# Patient Record
Sex: Female | Born: 1950 | Race: White | Hispanic: No | Marital: Single | Smoking: Former smoker
Health system: Southern US, Community
[De-identification: ages and names within clinical notes are randomized; demographics above are authoritative.]

## PROBLEM LIST (undated history)

## (undated) DIAGNOSIS — I4719 Other supraventricular tachycardia: Secondary | ICD-10-CM

## (undated) DIAGNOSIS — I471 Supraventricular tachycardia, unspecified: Secondary | ICD-10-CM

## (undated) DIAGNOSIS — B009 Herpesviral infection, unspecified: Secondary | ICD-10-CM

## (undated) DIAGNOSIS — M8580 Other specified disorders of bone density and structure, unspecified site: Secondary | ICD-10-CM

## (undated) DIAGNOSIS — Z923 Personal history of irradiation: Secondary | ICD-10-CM

## (undated) HISTORY — DX: Supraventricular tachycardia: I47.1

## (undated) HISTORY — DX: Other specified disorders of bone density and structure, unspecified site: M85.80

## (undated) HISTORY — PX: OTHER SURGICAL HISTORY: SHX169

## (undated) HISTORY — DX: Other supraventricular tachycardia: I47.19

## (undated) HISTORY — DX: Supraventricular tachycardia, unspecified: I47.10

## (undated) HISTORY — PX: ABDOMINAL HYSTERECTOMY: SHX81

## (undated) HISTORY — DX: Herpesviral infection, unspecified: B00.9

---

## 2005-11-07 ENCOUNTER — Ambulatory Visit: Payer: Self-pay | Admitting: Gastroenterology

## 2008-10-08 ENCOUNTER — Other Ambulatory Visit: Admission: RE | Admit: 2008-10-08 | Discharge: 2008-10-08 | Payer: Self-pay | Admitting: Family Medicine

## 2008-10-23 ENCOUNTER — Encounter: Admission: RE | Admit: 2008-10-23 | Discharge: 2008-10-23 | Payer: Self-pay | Admitting: Family Medicine

## 2008-11-03 ENCOUNTER — Encounter: Admission: RE | Admit: 2008-11-03 | Discharge: 2008-11-03 | Payer: Self-pay | Admitting: Family Medicine

## 2009-11-19 ENCOUNTER — Encounter: Admission: RE | Admit: 2009-11-19 | Discharge: 2009-11-19 | Payer: Self-pay | Admitting: Family Medicine

## 2010-05-01 HISTORY — PX: COLONOSCOPY: SHX174

## 2010-11-10 ENCOUNTER — Other Ambulatory Visit: Payer: Self-pay | Admitting: Physician Assistant

## 2010-11-10 DIAGNOSIS — Z1231 Encounter for screening mammogram for malignant neoplasm of breast: Secondary | ICD-10-CM

## 2010-11-24 ENCOUNTER — Ambulatory Visit
Admission: RE | Admit: 2010-11-24 | Discharge: 2010-11-24 | Disposition: A | Discharge status: Home / Self Care | Payer: PRIVATE HEALTH INSURANCE | Source: Ambulatory Visit | Attending: Physician Assistant | Admitting: Physician Assistant

## 2010-11-24 DIAGNOSIS — Z1231 Encounter for screening mammogram for malignant neoplasm of breast: Secondary | ICD-10-CM

## 2011-01-26 ENCOUNTER — Ambulatory Visit: Payer: Self-pay | Admitting: Gastroenterology

## 2011-04-17 ENCOUNTER — Encounter: Payer: Self-pay | Admitting: Internal Medicine

## 2011-09-18 ENCOUNTER — Telehealth: Payer: Self-pay | Admitting: Internal Medicine

## 2011-09-28 ENCOUNTER — Institutional Professional Consult (permissible substitution): Payer: PRIVATE HEALTH INSURANCE | Admitting: Internal Medicine

## 2011-12-07 ENCOUNTER — Encounter: Payer: Self-pay | Admitting: *Deleted

## 2011-12-08 ENCOUNTER — Ambulatory Visit (INDEPENDENT_AMBULATORY_CARE_PROVIDER_SITE_OTHER): Payer: PRIVATE HEALTH INSURANCE | Admitting: Internal Medicine

## 2011-12-08 ENCOUNTER — Encounter: Payer: Self-pay | Admitting: Internal Medicine

## 2011-12-08 VITALS — BP 111/75 | HR 51 | Ht 66.0 in | Wt 125.4 lb

## 2011-12-08 DIAGNOSIS — Z720 Tobacco use: Secondary | ICD-10-CM | POA: Insufficient documentation

## 2011-12-08 DIAGNOSIS — I498 Other specified cardiac arrhythmias: Secondary | ICD-10-CM

## 2011-12-08 DIAGNOSIS — I471 Supraventricular tachycardia: Secondary | ICD-10-CM

## 2011-12-08 DIAGNOSIS — F17200 Nicotine dependence, unspecified, uncomplicated: Secondary | ICD-10-CM

## 2011-12-08 NOTE — Assessment & Plan Note (Signed)
I've discussed the treatment options in detail with the patient. Medical therapy versus catheter ablation have been reviewed. The risk, goals, benefits, and expectations of catheter ablation have been discussed. The patient is interested in pursuing catheter ablation and will call us when she would like to schedule this procedure.

## 2011-12-08 NOTE — Patient Instructions (Signed)

## 2011-12-08 NOTE — Progress Notes (Signed)
HPI Carrie Serrano is referred today by Dr. Anne Fu for evaluation of SVT. She is a very pleasant middle-age woman who's health has otherwise been good. She has a history of tobacco abuse. The patient has a history of 10 years of tachycardia palpitations and documented SVT. The episodes start and stop suddenly. They lasted for up to several hours and induration. They are associated with shortness of breath and dizziness but never syncope and no chest pain. She has learned over time that she can stop the episodes with carotid sinus massage or Valsalva maneuver. She has worn a cardiac monitor which demonstrates a narrow QRS tachycardia at 175 beats per minute. Her symptoms have been recently well controlled with beta blockers. Unfortunately, she notes fatigue and weakness on her current dose of 50 mg daily. No Known Allergies   Current Outpatient Prescriptions  Medication Sig Dispense Refill  . BIOTIN FORTE PO Take 3,000 mg by mouth daily.      . fish oil-omega-3 fatty acids 1000 MG capsule Take 1 g by mouth daily.      . metoprolol succinate (TOPROL-XL) 50 MG 24 hr tablet Take 50 mg by mouth daily. 1/2 tablet  By mouth immediately following a meal.      . Multiple Vitamin (MULTIVITAMIN) tablet Take 1 tablet by mouth daily.      . valACYclovir (VALTREX) 500 MG tablet Take 500 mg by mouth 2 (two) times daily as needed.         Past Medical History  Diagnosis Date  . Osteopenia   . HSV-2 (herpes simplex virus 2) infection   . SVT (supraventricular tachycardia)   . PAT (paroxysmal atrial tachycardia)     ROS:   All systems reviewed and negative except as noted in the HPI.   No past surgical history on file.   No family history on file.   History   Social History  . Marital Status: Unknown    Spouse Name: N/A    Number of Children: N/A  . Years of Education: N/A   Occupational History  . Not on file.   Social History Main Topics  . Smoking status: Current Everyday Smoker -- 0.5  packs/day    Types: Cigarettes  . Smokeless tobacco: Never Used   Comment: 5 cigarettes a day.  . Alcohol Use: 3.5 - 4.5 oz/week    7-9 drink(s) per week  . Drug Use: No  . Sexually Active: Not on file   Other Topics Concern  . Not on file   Social History Narrative  . No narrative on file     BP 111/75  Pulse 51  Ht 5\' 6"  (1.676 m)  Wt 125 lb 6.4 oz (56.881 kg)  BMI 20.24 kg/m2  Physical Exam:  Well appearing middle-aged woman, NAD HEENT: Unremarkable Neck:  No JVD, no thyromegally Lungs:  Clear with no wheezes, rales, or rhonchi. HEART:  Regular rate rhythm, no murmurs, no rubs, no clicks Abd:  soft, positive bowel sounds, no organomegally, no rebound, no guarding Ext:  2 plus pulses, no edema, no cyanosis, no clubbing Skin:  No rashes no nodules Neuro:  CN II through XII intact, motor grossly intact  EKG Normal sinus rhythm with no ventricular preexcitation.  Assess/Plan:

## 2011-12-08 NOTE — Assessment & Plan Note (Signed)
She is still smoking a half pack of cigarettes daily. Hopefully she can discontinue this after ablation.

## 2012-01-29 ENCOUNTER — Other Ambulatory Visit: Payer: Self-pay | Admitting: Physician Assistant

## 2012-01-29 DIAGNOSIS — Z1231 Encounter for screening mammogram for malignant neoplasm of breast: Secondary | ICD-10-CM

## 2012-02-28 ENCOUNTER — Ambulatory Visit
Admission: RE | Admit: 2012-02-28 | Discharge: 2012-02-28 | Disposition: A | Discharge status: Home / Self Care | Payer: No Typology Code available for payment source | Source: Ambulatory Visit | Attending: Physician Assistant | Admitting: Physician Assistant

## 2012-02-28 DIAGNOSIS — Z1231 Encounter for screening mammogram for malignant neoplasm of breast: Secondary | ICD-10-CM

## 2012-02-29 ENCOUNTER — Telehealth: Payer: Self-pay | Admitting: Internal Medicine

## 2012-02-29 NOTE — Telephone Encounter (Signed)
Spoke with patient and she stated she had a patient with her and she could not talk right now.  Will call back later

## 2012-02-29 NOTE — Telephone Encounter (Signed)
Pt has questions regarding maybe getting an ablation done but she has some questions that she wants to go over with you please call her in afternoon that is the best time to reach her

## 2012-03-04 NOTE — Telephone Encounter (Signed)
Called patient back and she needs to know what the procedure code is and how long she will stay in the hospital. Wanted to know the average time for the procedure.  I let her know she can come in in Jan for an office visit to set up for Feb for the procedure.

## 2012-03-04 NOTE — Telephone Encounter (Signed)
I told her I would send the message to the coding people to call her in regards to codes for procedure.  All other questions answered and appointment made for Jan 2014

## 2012-03-08 NOTE — Telephone Encounter (Signed)
Spoke w/pt today and gave her CPT code for her ablation and our portion (MD) fee - approx $3,392.  She understands more fees will be involved such as anesthiology and hospital fees.  She will call her insurance to discuss her individual plan.  Gave my direct line if she has any further questions.

## 2012-03-26 ENCOUNTER — Telehealth: Payer: Self-pay | Admitting: Internal Medicine

## 2012-03-26 NOTE — Telephone Encounter (Signed)
Will need an office visit first as it as been 3 months since she has been seen

## 2012-03-26 NOTE — Telephone Encounter (Signed)
plz return call to pt at wk# to schedule an ablation per Dr. Ladona Ridgel

## 2012-05-06 ENCOUNTER — Telehealth: Payer: Self-pay | Admitting: Internal Medicine

## 2012-05-06 NOTE — Telephone Encounter (Signed)
New Problem:    Called in wanting to know the available dates that Dr. Ladona Ridgel has to perform her upcoming procedure.  Please call back and feel free to leave a messgae.

## 2012-05-13 NOTE — Telephone Encounter (Signed)
Tried to call patient  Number busy

## 2012-05-13 NOTE — Telephone Encounter (Signed)
Pt calling back, didn't get call back from message from last week

## 2012-05-14 ENCOUNTER — Encounter: Payer: Self-pay | Admitting: Internal Medicine

## 2012-05-14 ENCOUNTER — Other Ambulatory Visit: Payer: Self-pay | Admitting: *Deleted

## 2012-05-14 ENCOUNTER — Ambulatory Visit (INDEPENDENT_AMBULATORY_CARE_PROVIDER_SITE_OTHER): Payer: No Typology Code available for payment source | Admitting: Internal Medicine

## 2012-05-14 VITALS — BP 104/68 | HR 72 | Ht 66.0 in | Wt 129.8 lb

## 2012-05-14 DIAGNOSIS — I471 Supraventricular tachycardia: Secondary | ICD-10-CM

## 2012-05-14 DIAGNOSIS — I498 Other specified cardiac arrhythmias: Secondary | ICD-10-CM

## 2012-05-14 NOTE — Patient Instructions (Signed)
Your physician has recommended that you have an EP Study. This test is used to assess serious arrhythmias (irregular heartbeats). During an Electro-physiology Study (EPS), a thin, flexible wire is passed through a vein in your groin (upper thigh) or neck up to the heart. The wire records the heart's electrical signals. Your doctor uses the wire to electrically stimulate your heart and trigger an arrhythmic. This allows the doctor to see whether an antiarrhythmia medicine can help manage the problem or if further procedures are necessary (i.e., ablation/ICD). Radiofrequency ablation, a procedure used to fix some types of arrthythmia, may be done during an EPS. This is done in the hospital and often requires an overnight stay. Please see the instruction sheet given to your today for more information.  Your physician has recommended that you have an SVT ablation. Catheter ablation is a medical procedure used to treat some cardiac arrhythmias (irregular heartbeats). During catheter ablation, a long, thin, flexible tube is put into a blood vessel in your groin (upper thigh), or neck. This tube is called an ablation catheter. It is then guided to your heart through the blood vessel. Radio frequency waves destroy small areas of heart tissue where abnormal heartbeats may cause an arrhythmia to start. Please see the instruction sheet given to you today.

## 2012-05-14 NOTE — Progress Notes (Signed)
HPI Ms. Kaigler returns today for followup. She is a very pleasant 62 year old woman with a history of documented SVT which has been treated with moderate dose beta blocker therapy. When I saw the patient last several months ago, she was having increasingly frequent episodes of tachycardia palpitations though she had not sought medical therapy. In the interim she is been stable except for recurrent episodes of SVT. These start and stop suddenly. She also has palpitations which sound more like PACs. No syncope and no chest pain or shortness of breath. No Known Allergies   Current Outpatient Prescriptions  Medication Sig Dispense Refill  . BIOTIN FORTE PO Take 3,000 mg by mouth daily.      . fish oil-omega-3 fatty acids 1000 MG capsule Take 1 g by mouth daily.      . metoprolol succinate (TOPROL-XL) 50 MG 24 hr tablet Take 50 mg by mouth daily. 1/2 tablet  By mouth immediately following a meal.      . Multiple Vitamin (MULTIVITAMIN) tablet Take 1 tablet by mouth daily.      . valACYclovir (VALTREX) 500 MG tablet Take 500 mg by mouth 2 (two) times daily as needed.         Past Medical History  Diagnosis Date  . Osteopenia   . HSV-2 (herpes simplex virus 2) infection   . SVT (supraventricular tachycardia)   . PAT (paroxysmal atrial tachycardia)     ROS:   All systems reviewed and negative except as noted in the HPI.   No past surgical history on file.   No family history on file.   History   Social History  . Marital Status: Unknown    Spouse Name: N/A    Number of Children: N/A  . Years of Education: N/A   Occupational History  . Not on file.   Social History Main Topics  . Smoking status: Current Every Day Smoker -- 0.5 packs/day    Types: Cigarettes  . Smokeless tobacco: Never Used     Comment: 5 cigarettes a day.  . Alcohol Use: 3.5 - 4.5 oz/week    7-9 drink(s) per week  . Drug Use: No  . Sexually Active: Not on file   Other Topics Concern  . Not on file    Social History Narrative  . No narrative on file     BP 104/68  Pulse 72  Ht 5\' 6"  (1.676 m)  Wt 129 lb 12.8 oz (58.877 kg)  BMI 20.95 kg/m2  Physical Exam:  Well appearing middle-aged woman, NAD HEENT: Unremarkable Neck:  No JVD, no thyromegally Lungs:  Clear with no wheezes, rales, or rhonchi. HEART:  Regular rate rhythm, no murmurs, no rubs, no clicks Abd:  soft, positive bowel sounds, no organomegally, no rebound, no guarding Ext:  2 plus pulses, no edema, no cyanosis, no clubbing Skin:  No rashes no nodules Neuro:  CN II through XII intact, motor grossly intact  EKG Normal sinus rhythm with no ventricular preexcitation  Assess/Plan:

## 2012-05-14 NOTE — Assessment & Plan Note (Signed)
Once again I have discussed the treatment options with the patient. The risk, goals, benefits, and expectations of catheter ablation have been reviewed and the patient would like to proceed with her procedure. This will be scheduled in the next several weeks.

## 2012-05-15 ENCOUNTER — Telehealth: Payer: Self-pay | Admitting: Internal Medicine

## 2012-05-15 NOTE — Telephone Encounter (Signed)
New Problem:    Patient called returning your call about lab work due for her upcoming procedure on June 13, 2012.  Please call back.

## 2012-05-15 NOTE — Telephone Encounter (Signed)
Saw Dr Ladona Ridgel yesterday Set up for procedure

## 2012-05-16 ENCOUNTER — Encounter: Payer: Self-pay | Admitting: *Deleted

## 2012-05-17 NOTE — Telephone Encounter (Signed)
Spoke with patient yesterday and scheduled labs.

## 2012-05-29 ENCOUNTER — Encounter (HOSPITAL_COMMUNITY): Payer: Self-pay | Admitting: Pharmacy Technician

## 2012-06-04 ENCOUNTER — Other Ambulatory Visit (INDEPENDENT_AMBULATORY_CARE_PROVIDER_SITE_OTHER): Payer: No Typology Code available for payment source

## 2012-06-04 DIAGNOSIS — I498 Other specified cardiac arrhythmias: Secondary | ICD-10-CM

## 2012-06-04 DIAGNOSIS — I471 Supraventricular tachycardia: Secondary | ICD-10-CM

## 2012-06-05 ENCOUNTER — Other Ambulatory Visit: Payer: No Typology Code available for payment source

## 2012-06-05 LAB — BASIC METABOLIC PANEL
Chloride: 105 mEq/L (ref 96–112)
GFR: 64.74 mL/min (ref >60.00)
Glucose, Bld: 71 mg/dL (ref 70–99)
Potassium: 4.6 mEq/L (ref 3.5–5.1)
Sodium: 140 mEq/L (ref 135–145)

## 2012-06-05 LAB — CBC WITH DIFFERENTIAL/PLATELET
Basophils Absolute: 0.1 10*3/uL (ref 0.0–0.1)
HCT: 42.3 % (ref 36.0–46.0)
Hemoglobin: 14.3 g/dL (ref 12.0–15.0)
Lymphocytes Relative: 28.1 % (ref 12.0–46.0)
MCHC: 33.7 g/dL (ref 30.0–36.0)
MCV: 96.8 fl (ref 78.0–100.0)
Platelets: 199 10*3/uL (ref 150.0–400.0)

## 2012-06-13 ENCOUNTER — Encounter (HOSPITAL_COMMUNITY): Payer: Self-pay | Admitting: General Practice

## 2012-06-13 ENCOUNTER — Encounter (HOSPITAL_COMMUNITY)
Admission: RE | Disposition: A | Discharge status: Home / Self Care | Payer: Self-pay | Source: Ambulatory Visit | Attending: Internal Medicine

## 2012-06-13 ENCOUNTER — Ambulatory Visit (HOSPITAL_COMMUNITY)
Admission: RE | Admit: 2012-06-13 | Discharge: 2012-06-14 | Disposition: A | Discharge status: Home / Self Care | Payer: No Typology Code available for payment source | Source: Ambulatory Visit | Attending: Internal Medicine | Admitting: Internal Medicine

## 2012-06-13 DIAGNOSIS — I471 Supraventricular tachycardia: Secondary | ICD-10-CM

## 2012-06-13 DIAGNOSIS — M949 Disorder of cartilage, unspecified: Secondary | ICD-10-CM | POA: Insufficient documentation

## 2012-06-13 DIAGNOSIS — I498 Other specified cardiac arrhythmias: Secondary | ICD-10-CM | POA: Insufficient documentation

## 2012-06-13 DIAGNOSIS — B009 Herpesviral infection, unspecified: Secondary | ICD-10-CM | POA: Insufficient documentation

## 2012-06-13 DIAGNOSIS — M899 Disorder of bone, unspecified: Secondary | ICD-10-CM | POA: Insufficient documentation

## 2012-06-13 HISTORY — PX: ABLATION OF DYSRHYTHMIC FOCUS: SHX254

## 2012-06-13 HISTORY — PX: ELECTROPHYSIOLOGY STUDY: SHX5467

## 2012-06-13 HISTORY — PX: SUPRAVENTRICULAR TACHYCARDIA ABLATION: SHX5492

## 2012-06-13 SURGERY — ELECTROPHYSIOLOGY STUDY
Anesthesia: LOCAL

## 2012-06-13 MED ORDER — SODIUM CHLORIDE 0.9 % IV SOLN: 250.0000 mL | INTRAVENOUS | Status: DC | PRN

## 2012-06-13 MED ORDER — METOPROLOL SUCCINATE ER 25 MG PO TB24
25.0000 mg | ORAL_TABLET | Freq: Every day | ORAL | Status: DC
  Filled 2012-06-13 (×2): qty 1

## 2012-06-13 MED ORDER — ONDANSETRON HCL 4 MG/2ML IJ SOLN: 4.0000 mg | Freq: Four times a day (QID) | INTRAMUSCULAR | Status: DC | PRN

## 2012-06-13 MED ORDER — BUPIVACAINE HCL (PF) 0.25 % IJ SOLN
INTRAMUSCULAR | Status: AC
  Filled 2012-06-13: qty 60

## 2012-06-13 MED ORDER — SODIUM CHLORIDE 0.9 % IJ SOLN: 3.0000 mL | INTRAMUSCULAR | Status: DC | PRN

## 2012-06-13 MED ORDER — SODIUM CHLORIDE 0.9 % IJ SOLN: 3.0000 mL | Freq: Two times a day (BID) | INTRAMUSCULAR | Status: DC

## 2012-06-13 MED ORDER — PNEUMOCOCCAL VAC POLYVALENT 25 MCG/0.5ML IJ INJ
0.5000 mL | INJECTION | INTRAMUSCULAR | Status: AC
  Administered 2012-06-14: 10:00:00 0.5 mL via INTRAMUSCULAR
  Filled 2012-06-13: qty 0.5

## 2012-06-13 MED ORDER — HEPARIN (PORCINE) IN NACL 2-0.9 UNIT/ML-% IJ SOLN
INTRAMUSCULAR | Status: AC
  Filled 2012-06-13: qty 1000

## 2012-06-13 MED ORDER — MIDAZOLAM HCL 5 MG/5ML IJ SOLN
INTRAMUSCULAR | Status: AC
  Filled 2012-06-13: qty 5

## 2012-06-13 MED ORDER — METOPROLOL SUCCINATE ER 25 MG PO TB24
25.0000 mg | ORAL_TABLET | Freq: Every day | ORAL | Status: DC
  Filled 2012-06-13: qty 1

## 2012-06-13 MED ORDER — FENTANYL CITRATE 0.05 MG/ML IJ SOLN
INTRAMUSCULAR | Status: AC
  Filled 2012-06-13: qty 2

## 2012-06-13 MED ORDER — FENTANYL CITRATE 0.05 MG/ML IJ SOLN
INTRAMUSCULAR | Status: AC
Start: 2012-06-13 — End: 2012-06-13
  Filled 2012-06-13: qty 2

## 2012-06-13 MED ORDER — ACETAMINOPHEN 325 MG PO TABS: 650.0000 mg | ORAL_TABLET | ORAL | Status: DC | PRN

## 2012-06-13 MED ORDER — HYDROXYUREA 500 MG PO CAPS
ORAL_CAPSULE | ORAL | Status: AC
  Filled 2012-06-13: qty 1

## 2012-06-13 NOTE — Op Note (Signed)
Carrie Serrano, Carrie Serrano NO.:  [id number]  MEDICAL RECORD NO.:  [id number]  LOCATION:  6527                         FACILITY:  [facility]  PHYSICIAN:  Doylene Canning. 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  INTRODUCTION:  The patient is a pleasant 62 year old woman with a long history of tachy palpitations refractory to medical therapy with beta- blockers.  The patient is now referred for catheter ablation of her SVT.  OPERATIVE PROCEDURE:  After the informed consent was obtained, the patient was taken to the diagnostic EP lab in a fasting state.  After usual preparation and draping, intravenous fentanyl and midazolam was given for sedation.  A 6-French hexapolar catheter was inserted percutaneously in the right jugular vein and advanced to the coronary sinus.  A 6-French quadripolar catheter was inserted percutaneously in the right femoral vein and advanced to right ventricle.  A 6-French quadripolar catheter was inserted percutaneously in the right femoral vein and advanced to the His bundle region.  After measurement of the basic intervals, rapid ventricular pacing was carried out from the right ventricle initially demonstrating VA dissociation.  However, additional rather programmed ventricular stimulation was then carried out.  Also demonstrated initial VA dissociation.  Atrial stimulation was carried out from the atrium at base drive cycle length of 5 of rather 600 milliseconds and the S1-S2 interval stepwise decreased down to 400 milliseconds where the AV node ERP was observed.  During programmed atrial stimulation, there were multiple AH jumps and echo beats, but no SVT.  Next, rapid atrial pacing was carried out from the atrium at base drive  cycle length of 161 milliseconds and stepwise decreased down to 510 milliseconds where AV Wenckebach was observed.  During rapid atrial pacing, the PR interval was less than the RR interval and there was no inducible SVT.  At this point, isoproterenol was infused at rates of 2-4 mcg per minute.  Rapid ventricular pacing was then carried out demonstrating intact conduction through the AV node.  There was no accessory pathway conduction.  The VA Wenckebach cycle length during rapid ventricular pacing was 360 milliseconds.  Programmed ventricular stimulation was then carried out at a base drive cycle length of 096 milliseconds.  The S1-S2 interval stepwise decreased down to 380 milliseconds resulting in the initiation of SVT.  Mapping was carried out.  Mapping demonstrated midline VA activation and the VA interval that was basically 0 in SVT.  PVCs placed at the time of His bundle refractories did not pre-excite the atrium.  Finally during SVT ventricular pacing demonstrated a VAV activation sequence.  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  At this point, the 7-French quadripolar ablation catheter was maneuvered by way of the right femoral vein into the right atrium.  The coronary sinus was extremely large.  Manipulation of the catheter in Koch's triangle resulted and cannulation of the coronary sinus very readily.  The actual portion of Koch's triangle not including the  coronary sinus was actually very very small.  The tricuspid valve anulus appeared to be fairly posterior.  Mapping onto the bottom portion of Koch's triangle was result in the ablation catheter diving into the coronary sinus immediately.  A total of 26 RF energy applications, most of which were very short was then applied in and around the region of the AV node. During RF energy application, RF was discontinued because of accelerated junctional rhythm resulting in [place] block.  After approximately 2 hours  of mapping and ablation, it was deemed that the tachycardia would be much more difficult to induce, was still inducible, and it was deemed that any additional ablation would result in the creation of complete heart block.  Because of the patient's prior indication not to end up with a pacemaker possible, it was deemed most appropriate to abandon additional ablation.  It should be noted that, programmed ventricular stimulation at the conclusion of the procedure did result in SVT; however, the S1-S2 interval coupled together had to be 250 milliseconds to result in SVT. The clinical significance of this is uncertain.  At this point, the catheters were removed.  Hemostasis was assured, and the patient was returned to her room in satisfactory condition.  COMPLICATIONS:  There were no immediate procedure complications.  RESULTS:  A. Baseline ECG.  Baseline ECG demonstrates sinus rhythm with no pre-excitation. B.  Baseline intervals.  Sinus node cycle length 950 milliseconds.  QRS duration 115 milliseconds, AH interval 130 milliseconds, HV interval 40 milliseconds. C.  Rapid ventricular pacing:  Rapid ventricle patient was initially carried out demonstrating VA Wenckebach at 600 milliseconds.  Subsequent rapid ventricular pacing on isoproterenol resulted in intact VA conduction with midline decremental VA activation. D.  Programmed ventricular stimulation.  Programmed ventricular stimulation was carried out from the right ventricle with base drive cycle length of 409 milliseconds.  The S1-S2 interval stepwise decreased from 440 milliseconds.  Then, a 380 milliseconds resulted in the initiation of SVT. E.  Rapid atrial pacing.  Rapid atrial pacing was carried out from the atrium at base drive cycle length of 811 milliseconds and stepwise decreased down to 510 millisecond resulting in AV Wenckebach.  With isoproterenol initiation rapid atrial pacing was carried out down 240 milliseconds with  no SVT. F.  Programmed atrial stimulation:  Programmed atrial stimulation was carried out from the atrium at a base drive cycle length of 914 milliseconds.  The S1-S2 interval stepwise decreased down to 400 millisecond where the AV node ERP was observed.  During programmed atrial stimulation, there were multiple AH jumps and echo beats, but no inducible SVT. G.  Arrhythmias observed:  AV node reentrant tachycardia initiation was with programmed ventricular stimulation on isoproterenol.  The duration was sustained.  Termination was with pacing and catheter ablation. H.  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  This was not diagnosed definitively but was strongly suggested as the coronary sinus was very large. 1. RF energy application.  Total of 26 RF energy applications were     delivered, which were very short in duration.  It should be noted     during RF energy application, there was recurrent episodes of     accelerated junctional rhythm, but RF energy had to be discontinued     as the patient with manifest VA block.  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  The success of this procedure is uncertain at this point in time.  There was persistently inducible SVT at the end of the case, though it was much more difficult to induce.  The patient's atrial anatomy was very difficult as she had a coronary sinus ostium, which was at least 3 times the diameter of the normal situation and extraordinary small Koch's triangle with RF energy application applied in the triangle resulting in transient VA block on isoproterenol.     Doylene Canning. Ladona Ridgel, MD     GWT/MEDQ  D:  06/13/2012  T:  06/13/2012  Job:  [id number]

## 2012-06-13 NOTE — H&P (View-Only) (Signed)
HPI [name] returns today for followup. She is a very pleasant 61-year-old woman with a history of documented SVT which has been treated with moderate dose beta blocker therapy. When I saw the patient last several months ago, she was having increasingly frequent episodes of tachycardia palpitations though she had not sought medical therapy. In the interim she is been stable except for recurrent episodes of SVT. These start and stop suddenly. She also has palpitations which sound more like PACs. No syncope and no chest pain or shortness of breath. No Known Allergies   Current Outpatient Prescriptions  Medication Sig Dispense Refill  . BIOTIN FORTE PO Take 3,000 mg by mouth daily.      . fish oil-omega-3 fatty acids 1000 MG capsule Take 1 g by mouth daily.      . metoprolol succinate (TOPROL-XL) 50 MG 24 hr tablet Take 50 mg by mouth daily. 1/2 tablet  By mouth immediately following a meal.      . Multiple Vitamin (MULTIVITAMIN) tablet Take 1 tablet by mouth daily.      . valACYclovir (VALTREX) 500 MG tablet Take 500 mg by mouth 2 (two) times daily as needed.         Past Medical History  Diagnosis Date  . Osteopenia   . HSV-2 (herpes simplex virus 2) infection   . SVT (supraventricular tachycardia)   . PAT (paroxysmal atrial tachycardia)     ROS:   All systems reviewed and negative except as noted in the HPI.   No past surgical history on file.   No family history on file.   History   Social History  . Marital Status: Unknown    Spouse Name: N/A    Number of Children: N/A  . Years of Education: N/A   Occupational History  . Not on file.   Social History Main Topics  . Smoking status: Current Every Day Smoker -- 0.5 packs/day    Types: Cigarettes  . Smokeless tobacco: Never Used     Comment: 5 cigarettes a day.  . Alcohol Use: 3.5 - 4.5 oz/week    7-9 drink(s) per week  . Drug Use: No  . Sexually Active: Not on file   Other Topics Concern  . Not on file    Social History Narrative  . No narrative on file     BP 104/68  Pulse 72  Ht 5' 6" (1.676 m)  Wt 129 lb 12.8 oz (58.877 kg)  BMI 20.95 kg/m2  Physical Exam:  Well appearing middle-aged woman, NAD HEENT: Unremarkable Neck:  No JVD, no thyromegally Lungs:  Clear with no wheezes, rales, or rhonchi. HEART:  Regular rate rhythm, no murmurs, no rubs, no clicks Abd:  soft, positive bowel sounds, no organomegally, no rebound, no guarding Ext:  2 plus pulses, no edema, no cyanosis, no clubbing Skin:  No rashes no nodules Neuro:  CN II through XII intact, motor grossly intact  EKG Normal sinus rhythm with no ventricular preexcitation  Assess/Plan:  

## 2012-06-13 NOTE — Interval H&P Note (Signed)
History and Physical Interval Note:  06/13/2012 11:21 AM  Carrie Serrano  has presented today for surgery, with the diagnosis of SVT  The various methods of treatment have been discussed with the patient and family. After consideration of risks, benefits and other options for treatment, the patient has consented to  Procedure(s): ELECTROPHYSIOLOGY STUDY (N/A) SUPRAVENTRICULAR TACHYCARDIA ABLATION (N/A) as a surgical intervention .  The patient's history has been reviewed, patient examined, no change in status, stable for surgery.  I have reviewed the patient's chart and labs.  Questions were answered to the patient's satisfaction.     [name].D.

## 2012-06-13 NOTE — Op Note (Signed)
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 [id number].

## 2012-06-13 NOTE — Progress Notes (Signed)
B Blocker held this PM per parameter of HR less than 60.  Heart rate high 40's to mid 50's.

## 2012-06-14 DIAGNOSIS — I471 Supraventricular tachycardia, unspecified: Secondary | ICD-10-CM

## 2012-06-14 NOTE — Discharge Summary (Signed)
    ELECTROPHYSIOLOGY PROCEDURE DISCHARGE SUMMARY    Patient ID: Carrie Serrano,  MRN: [id number], DOB/AGE: 1950/12/19 62 y.o.  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  Osteopenia 2.  HSV-2 infection  Procedures This Admission: 1.  Electrophysiology study and radiofrequency catheter ablation of AVNRT on 06-13-2012 by Carrie Carrie Serrano.  This study demonstrated inducible AV node reentrant tachycardia. The success of this procedure is uncertain at this point in time. There was persistently inducible SVT at the end of the case, though it was much more difficult to induce. The patient's atrial anatomy was very difficult as she had a coronary sinus ostium, which was at least 3 times the diameter of the normal situation and extraordinary small Koch's triangle with RF energy application applied in the triangle resulting in  transient VA block on isoproterenol.  There were no early apparent complications.   Brief HPI: Carrie Serrano is a 62 year old female with a long standing history of tachy palpitations that are refractory to beta blocker therapy.  Risks, benefits, and alternatives to ablation were discussed with the patient who wished to proceed.   Hospital Course:  The patient was admitted and underwent ablation of AVNRT on 06-13-2012 with details as outlined above.  She was monitored on telemetry overnight which demonstrated sinus brady, sinus rhythm. Her groin and neck incisions were without complication.  She was evaluated by Carrie Serrano who considered her stable for discharge to home.   Discharge Vitals: Blood pressure 100/68, pulse 46, temperature 98.4 F (36.9 C), temperature source Oral, resp. rate 17, Serrano 5\' 6"  (1.676 m), weight 135 lb 14.4 oz (61.644 kg), SpO2 97.00%.    Labs:   Lab Results    Component Value Date   WBC 7.2 06/04/2012   HGB 14.3 06/04/2012   HCT 42.3 06/04/2012   MCV 96.8 06/04/2012   PLT 199.0 06/04/2012     Discharge Medications:    Medication List    ASK your doctor about these medications       BIOTIN FORTE PO  Take 3,000 mg by mouth daily.     fish oil-omega-3 fatty acids 1000 MG capsule  Take 1 g by mouth daily.     metoprolol succinate 50 MG 24 hr tablet  Commonly known as:  TOPROL-XL  Take 25 mg by mouth at bedtime.     multivitamin tablet  Take 1 tablet by mouth daily.     valACYclovir 500 MG tablet  Commonly known as:  VALTREX  Take 500 mg by mouth 2 (two) times daily as needed. For outbreak        Disposition:   Future Appointments Provider Department Dept Phone   09/12/2012 9:15 AM Carrie Maw, MD [facility] Main Office [place]) [phone]       Duration of Discharge Encounter: Greater than 30 minutes including physician time.    Carrie Range, MD

## 2012-06-14 NOTE — Progress Notes (Signed)
   ELECTROPHYSIOLOGY ROUNDING NOTE    Patient Name: Carrie Serrano Date of Encounter: 06-14-2012    SUBJECTIVE:Patient feels well.  No chest pain or shortness of breath.  S/p RFCA of AVNRT 06-13-2012.    TELEMETRY: Reviewed telemetry pt in sinus rhythm/sinus bradycardia Physical Exam: Filed Vitals:   06/14/12 0015 06/14/12 0500 06/14/12 0740 06/14/12 0745  BP: 132/67 115/68  100/68  Pulse: 52 46    Temp: 98.5 F (36.9 C) 98.5 F (36.9 C) 98.4 F (36.9 C)   TempSrc: Oral Oral Oral   Resp: 18 17    Height:      Weight: 135 lb 14.4 oz (61.644 kg)     SpO2: 97% 97%      GEN- The patient is well appearing, alert and oriented x 3 today.   Head- normocephalic, atraumatic Eyes-  Sclera clear, conjunctiva pink Ears- hearing intact Oropharynx- clear Neck- supple, no JVP Lymph- no cervical lymphadenopathy Lungs- Clear to ausculation bilaterally, normal work of breathing Heart- Regular rate and rhythm, no murmurs, rubs or gallops, PMI not laterally displaced GI- soft, NT, ND, + BS Extremities- no clubbing, cyanosis, or edema Neuro- strength and sensation are intact  Assessment and Plan:  1. SVT S/p ablation Resume metoprolol  DC to home  Wound care, restrictions reviewed with patient.  Routine follow up scheduled.

## 2012-07-30 ENCOUNTER — Encounter: Payer: Self-pay | Admitting: Internal Medicine

## 2012-07-30 ENCOUNTER — Ambulatory Visit (INDEPENDENT_AMBULATORY_CARE_PROVIDER_SITE_OTHER): Payer: No Typology Code available for payment source | Admitting: Internal Medicine

## 2012-07-30 ENCOUNTER — Encounter: Payer: No Typology Code available for payment source | Admitting: Internal Medicine

## 2012-07-30 VITALS — BP 116/69 | HR 52 | Ht 66.0 in | Wt 128.0 lb

## 2012-07-30 DIAGNOSIS — I471 Supraventricular tachycardia: Secondary | ICD-10-CM

## 2012-07-30 DIAGNOSIS — I498 Other specified cardiac arrhythmias: Secondary | ICD-10-CM

## 2012-07-30 NOTE — Assessment & Plan Note (Signed)
The patient is doing well status post catheter ablation with no recurrent symptomatic SVT. I've asked her to wean herself off of the Toprol by taking half of a 25 mg tablet daily for a month and then stop it altogether. She will call us back if she has recurrent symptomatic SVT.

## 2012-07-30 NOTE — Progress Notes (Signed)
HPI Carrie Serrano returns today for followup. She is a very pleasant 62 year old woman with a history of SVT, who underwent catheter ablation several weeks ago. She had very difficult anatomy. She had extraordinarily large coronary sinus ostium and extraordinarily small cooks triangle, making catheter ablation of her slow pathway do to AV node reentrant tachycardia much more difficult. Her procedure was notable in that she still had residual inducible SVT though not nearly as easily inducible as it had been prior to her ablation. Since her procedure, she has done well. She has had no symptomatic palpitations and no documented SVT. She remains on 25 mg of Toprol daily. No Known Allergies   Current Outpatient Prescriptions  Medication Sig Dispense Refill  . metoprolol succinate (TOPROL-XL) 50 MG 24 hr tablet Take 25 mg by mouth at bedtime.       . Multiple Vitamin (MULTIVITAMIN) tablet Take 1 tablet by mouth daily.      . valACYclovir (VALTREX) 500 MG tablet Take 500 mg by mouth 2 (two) times daily as needed. For outbreak       No current facility-administered medications for this visit.     Past Medical History  Diagnosis Date  . Osteopenia   . HSV-2 (herpes simplex virus 2) infection   . SVT (supraventricular tachycardia)   . PAT (paroxysmal atrial tachycardia)     ROS:   All systems reviewed and negative except as noted in the HPI.   Past Surgical History  Procedure Laterality Date  . Ablation of dysrhythmic focus  06/13/2012    SVT    Dr  Ladona Ridgel  . Abdominal hysterectomy    . Colonoscopy  2012     No family history on file.   History   Social History  . Marital Status: Single    Spouse Name: N/A    Number of Children: N/A  . Years of Education: N/A   Occupational History  . Not on file.   Social History Main Topics  . Smoking status: Current Every Day Smoker -- 0.50 packs/day for 25 years    Types: Cigarettes  . Smokeless tobacco: Never Used     Comment: 5  cigarettes a day.  . Alcohol Use: 3.5 - 4.5 oz/week    7-9 drink(s) per week     Comment: one beer a day  . Drug Use: No  . Sexually Active: Not on file   Other Topics Concern  . Not on file   Social History Narrative  . No narrative on file     BP 116/69  Pulse 52  Ht 5\' 6"  (1.676 m)  Wt 128 lb (58.06 kg)  BMI 20.67 kg/m2  Physical Exam:  Well appearing middle-age woman,NAD HEENT: Unremarkable Neck:  No JVD, no thyromegally Lungs:  Clear with no wheezes, rales, or rhonchi. HEART:  Regular rate rhythm, no murmurs, no rubs, no clicks Abd:  soft, positive bowel sounds, no organomegally, no rebound, no guarding Ext:  2 plus pulses, no edema, no cyanosis, no clubbing Skin:  No rashes no nodules Neuro:  CN II through XII intact, motor grossly intact  EKG Sinus rhythm with incomplete right bundle branch block and left anterior fascicular block.in  Assess/Plan:

## 2012-07-30 NOTE — Patient Instructions (Signed)
Your physician recommends that you schedule a follow-up appointment as needed  

## 2012-09-12 ENCOUNTER — Encounter: Payer: No Typology Code available for payment source | Admitting: Internal Medicine

## 2013-02-12 ENCOUNTER — Other Ambulatory Visit: Payer: Self-pay

## 2013-02-12 DIAGNOSIS — Z1231 Encounter for screening mammogram for malignant neoplasm of breast: Secondary | ICD-10-CM

## 2013-03-14 ENCOUNTER — Ambulatory Visit
Admission: RE | Admit: 2013-03-14 | Discharge: 2013-03-14 | Disposition: A | Discharge status: Home / Self Care | Payer: 59 | Source: Ambulatory Visit

## 2013-03-14 DIAGNOSIS — Z1231 Encounter for screening mammogram for malignant neoplasm of breast: Secondary | ICD-10-CM

## 2013-07-12 ENCOUNTER — Encounter: Payer: Self-pay | Admitting: *Deleted

## 2013-07-24 ENCOUNTER — Ambulatory Visit: Payer: No Typology Code available for payment source | Admitting: Cardiology

## 2013-08-15 ENCOUNTER — Ambulatory Visit: Payer: 59 | Admitting: Cardiology

## 2013-09-05 ENCOUNTER — Ambulatory Visit (INDEPENDENT_AMBULATORY_CARE_PROVIDER_SITE_OTHER): Payer: 59 | Admitting: Cardiology

## 2013-09-05 ENCOUNTER — Encounter (INDEPENDENT_AMBULATORY_CARE_PROVIDER_SITE_OTHER): Payer: Self-pay

## 2013-09-05 ENCOUNTER — Encounter: Payer: Self-pay | Admitting: Cardiology

## 2013-09-05 VITALS — BP 99/66 | HR 56 | Ht 66.0 in | Wt 126.0 lb

## 2013-09-05 DIAGNOSIS — Z720 Tobacco use: Secondary | ICD-10-CM

## 2013-09-05 DIAGNOSIS — I4460 Unspecified fascicular block: Secondary | ICD-10-CM

## 2013-09-05 DIAGNOSIS — I444 Left anterior fascicular block: Secondary | ICD-10-CM

## 2013-09-05 DIAGNOSIS — I498 Other specified cardiac arrhythmias: Secondary | ICD-10-CM

## 2013-09-05 DIAGNOSIS — I4510 Unspecified right bundle-branch block: Secondary | ICD-10-CM | POA: Insufficient documentation

## 2013-09-05 DIAGNOSIS — I471 Supraventricular tachycardia: Secondary | ICD-10-CM

## 2013-09-05 DIAGNOSIS — F17200 Nicotine dependence, unspecified, uncomplicated: Secondary | ICD-10-CM

## 2013-09-05 NOTE — Patient Instructions (Signed)
Your physician recommends that you continue on your current medications as directed. Please refer to the Current Medication list given to you today.  Your physician wants you to follow-up in: 1 year with Dr. Skains. You will receive a reminder letter in the mail two months in advance. If you don't receive a letter, please call our office to schedule the follow-up appointment.  

## 2013-09-05 NOTE — Progress Notes (Signed)
[facility]. [address], [place]  [address] Phone: [phone] Fax:  [phone]  Date:  09/05/2013   ID:  Carrie Serrano, DOB 19-Sep-1950, MRN [id number]  PCP:  REDMON,NOELLE, PA-C   History of Present Illness: Carrie Serrano is a 63 y.o. female with history of supraventricular tachycardia/paroxysmal atrial tachycardia status post ablation in 2014 by Dr. Lovena Le here for followup. On 04/17/11 her EKG strip looked AVNRT and a heart rate of 160-170. On another visit she brought me another strip of her going into her arrhythmia. I do not see the onset of the arrhythmia but when compared to prior strip, her heart rate is actually in the 140s. I do not see a clear P-wave preceding the QRS complexes. She does terminate with a PVC. The remaining beats are in sinus rhythm. EPS, questionable success in lab but clinically she has improved dramatically.     Wt Readings from Last 3 Encounters:  09/05/13 126 lb (57.153 kg)  07/30/12 128 lb (58.06 kg)  06/14/12 135 lb 14.4 oz (61.644 kg)     Past Medical History  Diagnosis Date  . Osteopenia   . HSV-2 (herpes simplex virus 2) infection   . SVT (supraventricular tachycardia)   . PAT (paroxysmal atrial tachycardia)     Past Surgical History  Procedure Laterality Date  . Ablation of dysrhythmic focus  06/13/2012    SVT    Dr  Lovena Le  . Abdominal hysterectomy    . Colonoscopy  2012    Current Outpatient Prescriptions  Medication Sig Dispense Refill  . metoprolol succinate (TOPROL-XL) 25 MG 24 hr tablet Take 12.5 mg by mouth daily.      . Multiple Vitamin (MULTIVITAMIN) tablet Take 1 tablet by mouth daily.      . valACYclovir (VALTREX) 500 MG tablet Take 500 mg by mouth 2 (two) times daily as needed. For outbreak       No current facility-administered medications for this visit.    Allergies:   Not on File  Social History:  The patient  reports that she has been smoking Cigarettes.  She has a 12.5 pack-year smoking  history. She has never used smokeless tobacco. She reports that she drinks about 3.5 - 4.5 ounces of alcohol per week. She reports that she does not use illicit drugs.   Family History  Problem Relation Age of Onset  . Stroke Mother   . Diabetes Mother   . Heart attack Father   . Hypertension Father   Father - CHF  ROS:  Please see the history of present illness.   Denies any syncope, palpitations, shortness of breath.   All other systems reviewed and negative.   PHYSICAL EXAM: VS:  BP 99/66  Pulse 56  Ht 5\' 6"  (1.676 m)  Wt 126 lb (57.153 kg)  BMI 20.35 kg/m2 Well nourished, well developed, in no acute distress HEENT: normal, [place]/AT, EOMI Neck: no JVD, normal carotid upstroke, no bruit Cardiac:  normal S1, S2; RRR; no murmur Lungs:  clear to auscultation bilaterally, no wheezing, rhonchi or rales Abd: soft, nontender, no hepatomegaly, no bruits Ext: no edema, 2+ distal pulses Skin: warm and dry GU: deferred Neuro: no focal abnormalities noted, AAO x 3  EKG:  09/05/13-sinus bradycardia rate 57 with left anterior fascicular block, right bundle branch block, bifascicular block.     ASSESSMENT AND PLAN:  1. 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 2. SVT-prior EP ablation-12.5 Toprol  at night. She is doing very well on this. We will discontinue. She is a bit worried about stopping the medication. I don't see any particular harm in continuing. 3. Tobacco cessation-discussed.  Signed, Candee Furbish, MD [facility]  09/05/2013 3:13 PM

## 2013-12-01 ENCOUNTER — Other Ambulatory Visit: Payer: Self-pay | Admitting: Cardiology

## 2014-02-23 ENCOUNTER — Other Ambulatory Visit: Payer: Self-pay

## 2014-02-23 DIAGNOSIS — Z1231 Encounter for screening mammogram for malignant neoplasm of breast: Secondary | ICD-10-CM

## 2014-03-17 ENCOUNTER — Ambulatory Visit
Admission: RE | Admit: 2014-03-17 | Discharge: 2014-03-17 | Disposition: A | Discharge status: Home / Self Care | Payer: 59 | Source: Ambulatory Visit

## 2014-03-17 DIAGNOSIS — Z1231 Encounter for screening mammogram for malignant neoplasm of breast: Secondary | ICD-10-CM

## 2014-04-09 ENCOUNTER — Encounter (HOSPITAL_COMMUNITY): Payer: Self-pay | Admitting: Internal Medicine

## 2014-05-28 ENCOUNTER — Other Ambulatory Visit: Payer: Self-pay | Admitting: Physician Assistant

## 2014-05-28 ENCOUNTER — Ambulatory Visit
Admission: RE | Admit: 2014-05-28 | Discharge: 2014-05-28 | Disposition: A | Discharge status: Home / Self Care | Payer: Self-pay | Source: Ambulatory Visit | Attending: Physician Assistant | Admitting: Physician Assistant

## 2014-05-28 DIAGNOSIS — R05 Cough: Secondary | ICD-10-CM

## 2014-05-28 DIAGNOSIS — R059 Cough, unspecified: Secondary | ICD-10-CM

## 2014-05-28 IMAGING — CR DG CHEST 2V
2 series · 2 of 2 positions shown · non-contrast
Comparison: None.

CLINICAL DATA: 63-year-old female with 3 week history of cough and
4 day history of chest pain

EXAM:
CHEST  2 VIEW

[w chest pa]
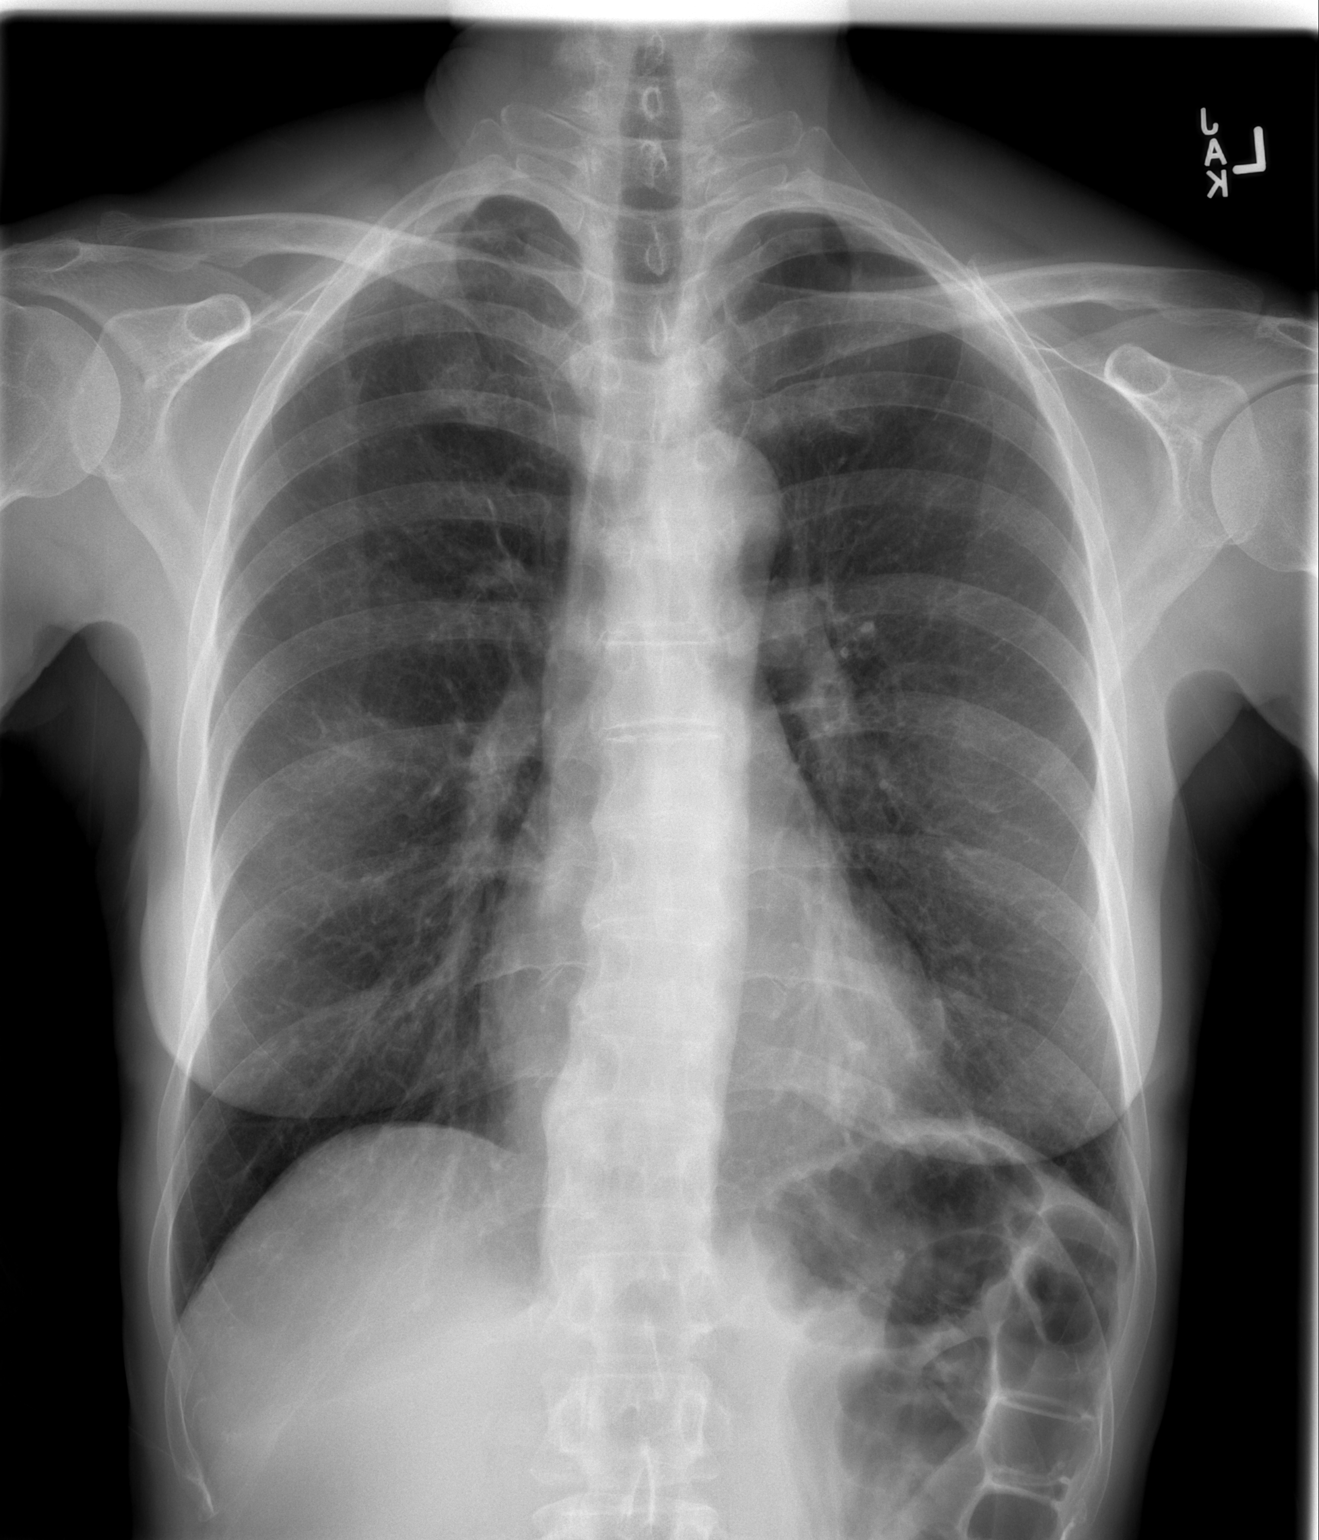

[w chest lat]
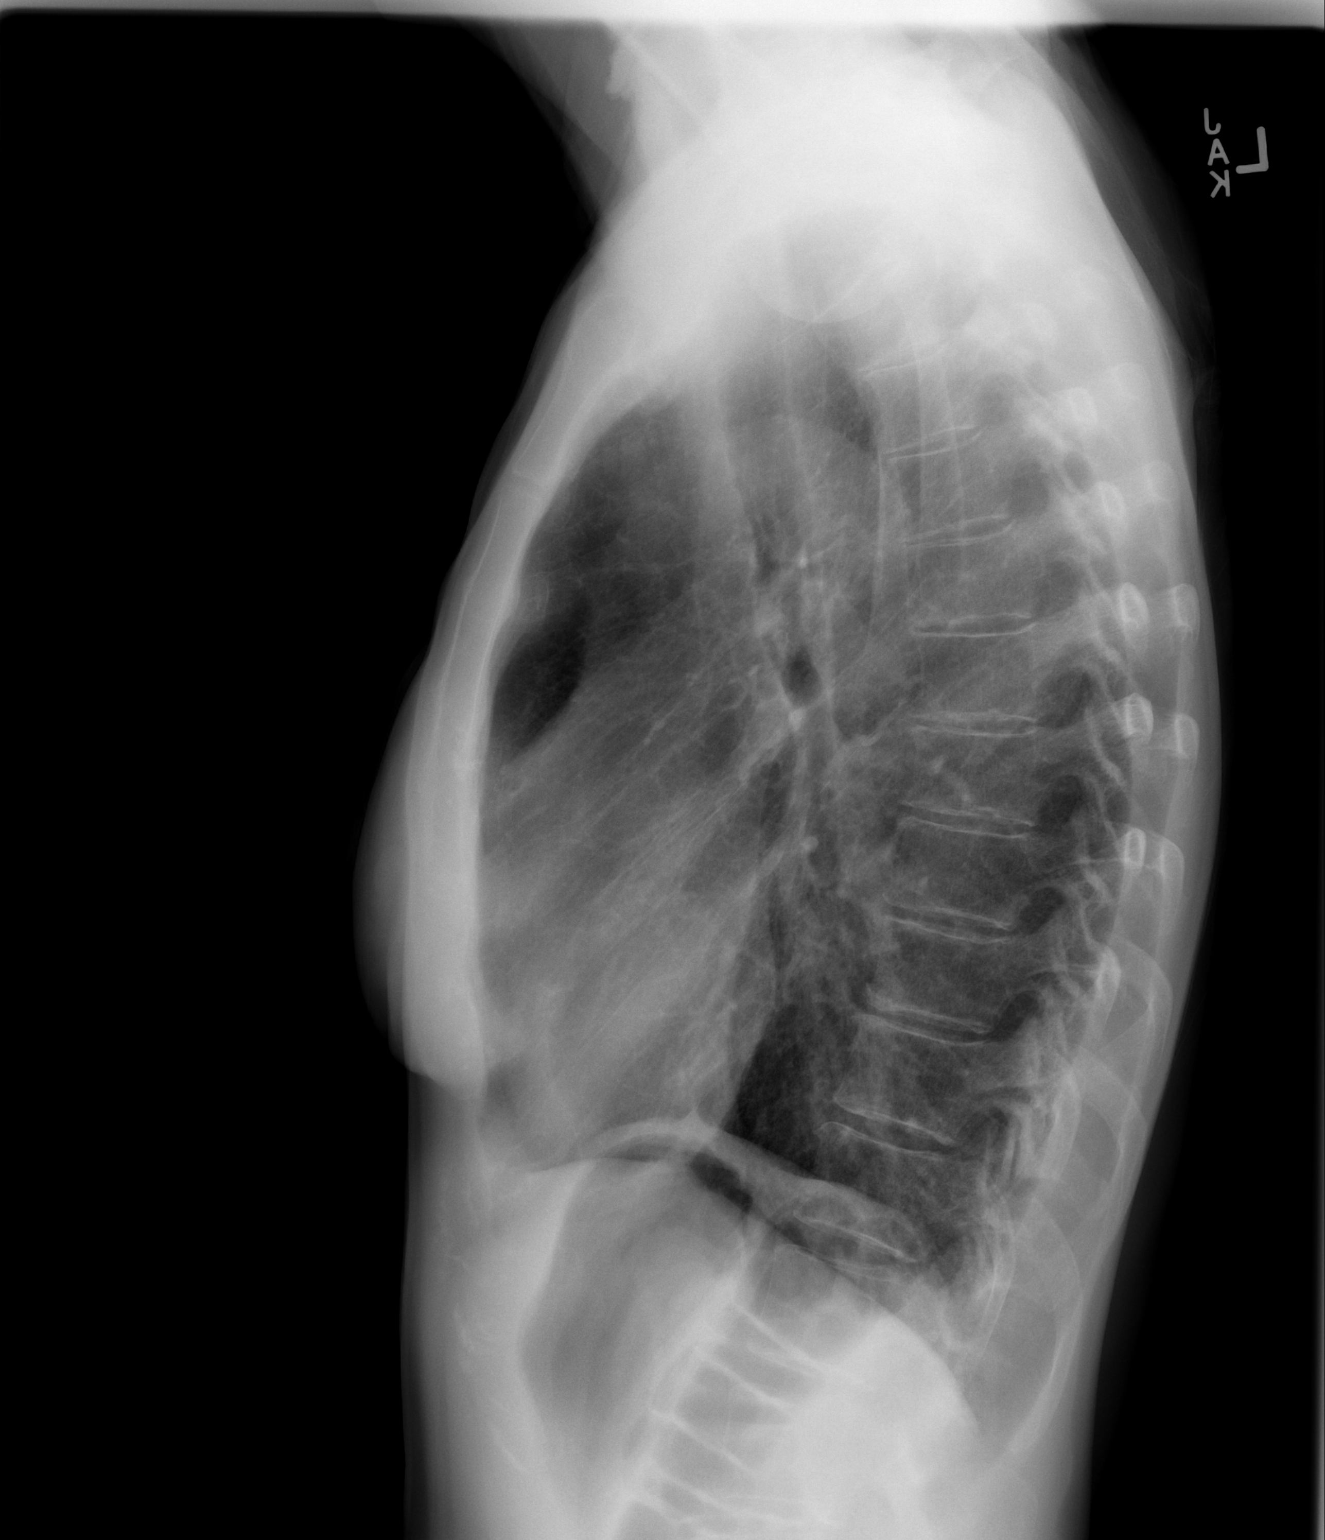

[2 of 2 positions shown; findings below may reference images not displayed]

FINDINGS: The cardiac and mediastinal contours are within normal limits. The
lungs are hyperexpanded. There are diffuse mild central bronchitic
changes and slight coarsening of the interstitial prominence. No
evidence of pleural effusion, pneumothorax or pulmonary edema. No
focal airspace consolidation. No acute osseous abnormality.
IMPRESSION: 1. No acute cardiopulmonary process.
2. Pulmonary hyperexpansion with central bronchitic change in mild
coarsening of the interstitial markings. In aggregate, these
findings suggest underlying COPD.

## 2014-06-10 ENCOUNTER — Other Ambulatory Visit: Payer: Self-pay | Admitting: Cardiology

## 2014-09-18 ENCOUNTER — Ambulatory Visit (INDEPENDENT_AMBULATORY_CARE_PROVIDER_SITE_OTHER): Payer: 59 | Admitting: Cardiology

## 2014-09-18 ENCOUNTER — Encounter: Payer: Self-pay | Admitting: Cardiology

## 2014-09-18 VITALS — BP 110/62 | HR 50 | Ht 66.0 in | Wt 119.1 lb

## 2014-09-18 DIAGNOSIS — Z720 Tobacco use: Secondary | ICD-10-CM

## 2014-09-18 DIAGNOSIS — Z87891 Personal history of nicotine dependence: Secondary | ICD-10-CM

## 2014-09-18 DIAGNOSIS — I4510 Unspecified right bundle-branch block: Secondary | ICD-10-CM

## 2014-09-18 DIAGNOSIS — I471 Supraventricular tachycardia: Secondary | ICD-10-CM

## 2014-09-18 DIAGNOSIS — I444 Left anterior fascicular block: Secondary | ICD-10-CM | POA: Diagnosis not present

## 2014-09-18 NOTE — Progress Notes (Signed)
[facility]. [address], [place]  [address] Phone: [phone] Fax:  [phone]  Date:  09/18/2014   ID:  Carrie Serrano, DOB 01-Apr-1951, MRN [id number]  PCP:  REDMON,NOELLE, PA-C   History of Present Illness: Carrie Serrano is a 64 y.o. female with history of supraventricular tachycardia/paroxysmal atrial tachycardia status post ablation in 2014 by Dr. Lovena Le here for followup. On 04/17/11 her EKG strip looked AVNRT and a heart rate of 160-170. On another visit she brought me another strip of her going into her arrhythmia. I do not see the onset of the arrhythmia but when compared to prior strip, her heart rate is actually in the 140s. I do not see a clear P-wave preceding the QRS complexes. She does terminate with a PVC. The remaining beats are in sinus rhythm. EPS, questionable success in lab but clinically she has improved dramatically.   She has been doing well. 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 She is working hard with Dr. Buelah Manis.    Wt Readings from Last 3 Encounters:  09/18/14 119 lb 1.9 oz (54.032 kg)  09/05/13 126 lb (57.153 kg)  07/30/12 128 lb (58.06 kg)     Past Medical History  Diagnosis Date  . Osteopenia   . HSV-2 (herpes simplex virus 2) infection   . SVT (supraventricular tachycardia)   . PAT (paroxysmal atrial tachycardia)     Past Surgical History  Procedure Laterality Date  . Ablation of dysrhythmic focus  06/13/2012    SVT    Dr  Lovena Le  . Abdominal hysterectomy    . Colonoscopy  2012  . Electrophysiology study N/A 06/13/2012    Procedure: ELECTROPHYSIOLOGY STUDY;  Surgeon: Evans Lance, MD;  Location: [facility] CATH LAB;  Service: Cardiovascular;  Laterality: N/A;  . Supraventricular tachycardia ablation N/A 06/13/2012    Procedure: SUPRAVENTRICULAR TACHYCARDIA ABLATION;  Surgeon: Evans Lance, MD;  Location[facility] C[facility] LAB;  Service: Cardiovascular;  Laterality: N/A;    Current Outpatient Prescriptions    Medication Sig Dispense Refill  . metoprolol succinate (TOPROL-XL) 25 MG 24 hr tablet Take 12.5 mg by mouth daily.    . Multiple Vitamin (MULTIVITAMIN) tablet Take 1 tablet by mouth daily.    . valACYclovir (VALTREX) 500 MG tablet Take 500 mg by mouth 2 (two) times daily as needed. For outbreak     No current facility-administered medications for this visit.    Allergies:    Allergies  Allergen Reactions  . Latex Itching    Social History:  The patient  reports that she quit smoking about 4 months ago. Her smoking use included Cigarettes. She has a 12.5 pack-year smoking history. She has never used smokeless tobacco. She reports that she drinks about 4.2 - 5.4 oz of alcohol per week. She reports that she does not use illicit drugs.   Family History  Problem Relation Age of Onset  . Stroke Mother   . Diabetes Mother   . Heart attack Father   . Hypertension Father   Father - CHF  ROS:  Please see the history of present illness.   Denies any syncope, palpitations, shortness of breath.   All other systems reviewed and negative.   PHYSICAL EXAM: VS:  BP 110/62 mmHg  Pulse 50  Ht 5\' 6"  (1.676 m)  Wt 119 lb 1.9 oz (54.032 kg)  BMI 19.24 kg/m2 Well nourished, well developed, in no acute distress HEENT: normal, [place]/AT, EOMI Neck: no  JVD, normal carotid upstroke, no bruit Cardiac:  normal S1, S2; RRR; no murmur Lungs:  clear to auscultation bilaterally, no wheezing, rhonchi or rales Abd: soft, nontender, no hepatomegaly, no bruits Ext: no edema, 2+ distal pulses Skin: warm and dry GU: deferred Neuro: no focal abnormalities noted, AAO x 3  EKG:  09/18/14-sinus bradycardia, left axis deviation, right bundle branch block-previously 09/05/13-sinus bradycardia rate 57 with left anterior fascicular block, right bundle branch block, bifascicular block.     ASSESSMENT AND PLAN:  1. 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 2. SVT-prior EP ablation-12.5 Toprol at night. She is doing very well on this. I'm fine with her either way continuing or discontinuing. We will discontinue. She is a bit worried about stopping the medication. I don't see any particular harm in continuing. 3. Tobacco cessation-great job. 4. One year follow up.   Signed, Candee Furbish, MD [facility]  09/18/2014 3:11 PM

## 2014-09-18 NOTE — Patient Instructions (Signed)
Medication Instructions:  Your physician recommends that you continue on your current medications as directed. Please refer to the Current Medication list given to you today.  Follow-Up: Follow up in 1 year with Dr. Skains.  You will receive a letter in the mail 2 months before you are due.  Please call us when you receive this letter to schedule your follow up appointment.  Thank you for choosing [facility]!!       

## 2015-02-08 ENCOUNTER — Other Ambulatory Visit: Payer: Self-pay

## 2015-02-08 DIAGNOSIS — Z1231 Encounter for screening mammogram for malignant neoplasm of breast: Secondary | ICD-10-CM

## 2015-03-17 ENCOUNTER — Ambulatory Visit
Admission: RE | Admit: 2015-03-17 | Discharge: 2015-03-17 | Disposition: A | Discharge status: Home / Self Care | Payer: 59 | Source: Ambulatory Visit

## 2015-03-17 DIAGNOSIS — Z1231 Encounter for screening mammogram for malignant neoplasm of breast: Secondary | ICD-10-CM

## 2015-03-17 IMAGING — MG MM DIGITAL SCREENING BILAT W/ TOMO W/ CAD
8 series · 9 of 24 positions shown · non-contrast
Comparison: Previous exam(s).

CLINICAL DATA: Screening.

EXAM:
DIGITAL SCREENING BILATERAL MAMMOGRAM WITH 3D TOMO WITH CAD

[R CC]
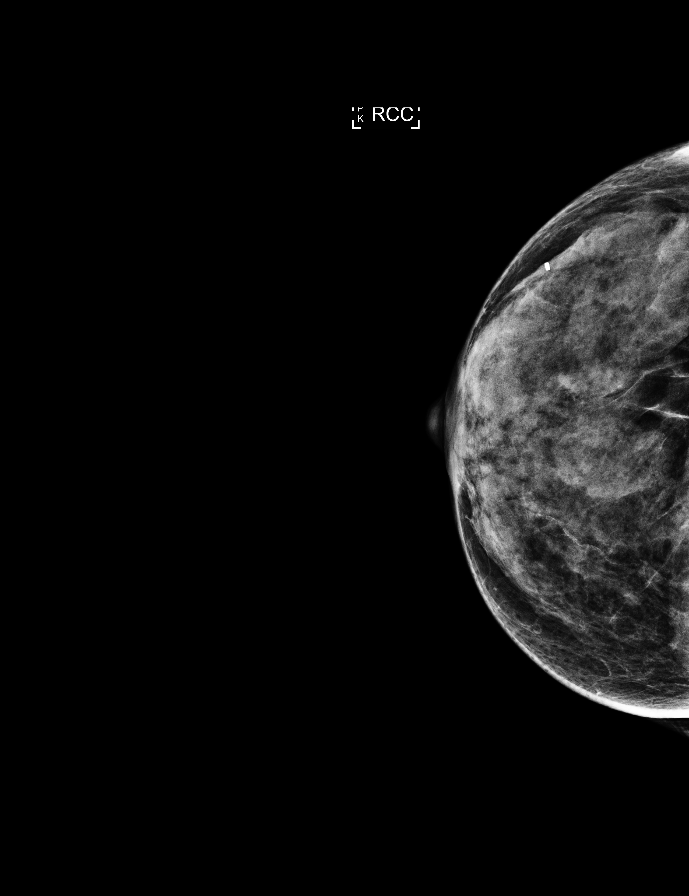

[R MLO]
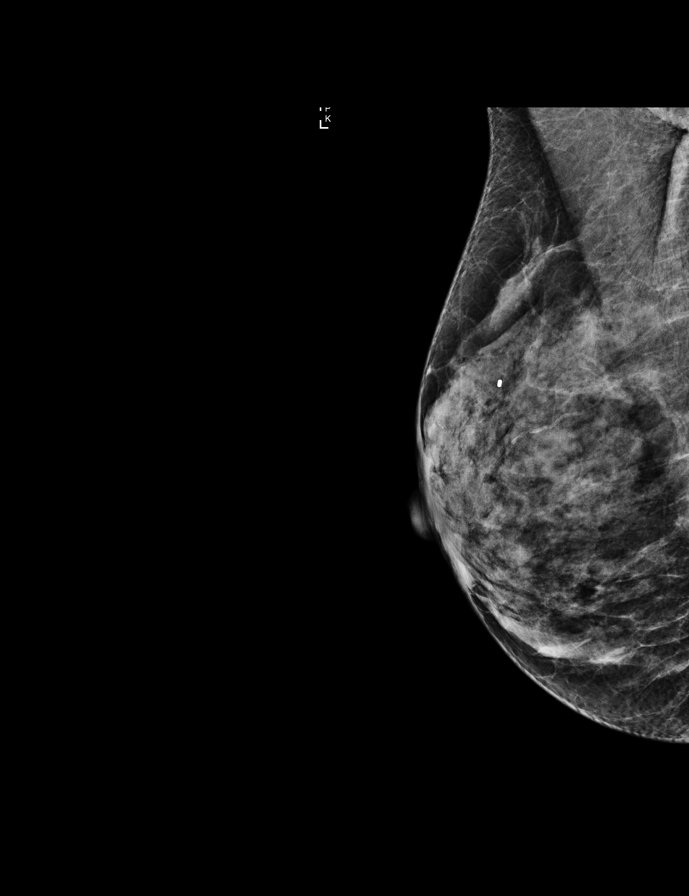

[L MLO]
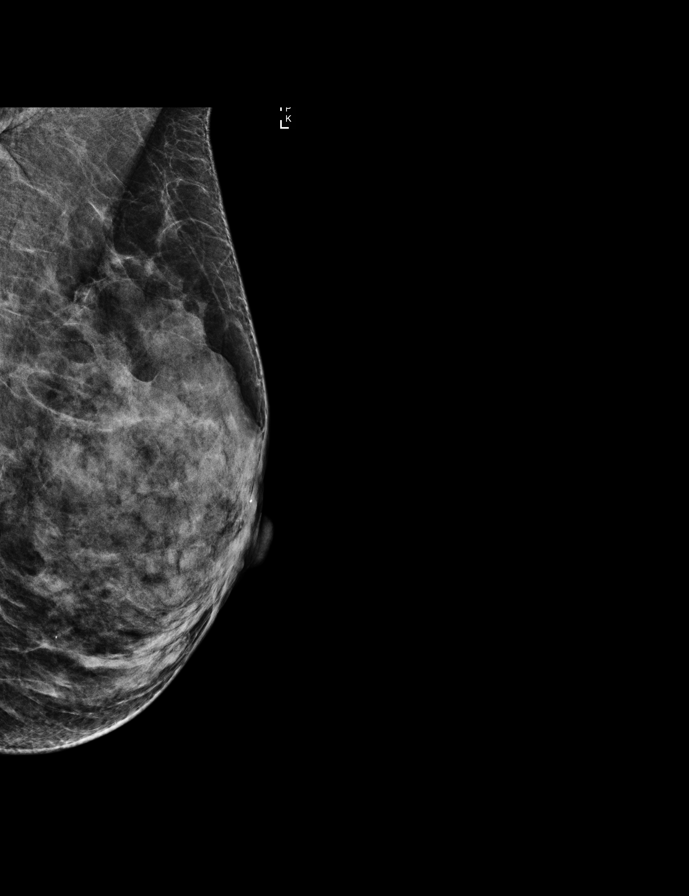

[L CC]
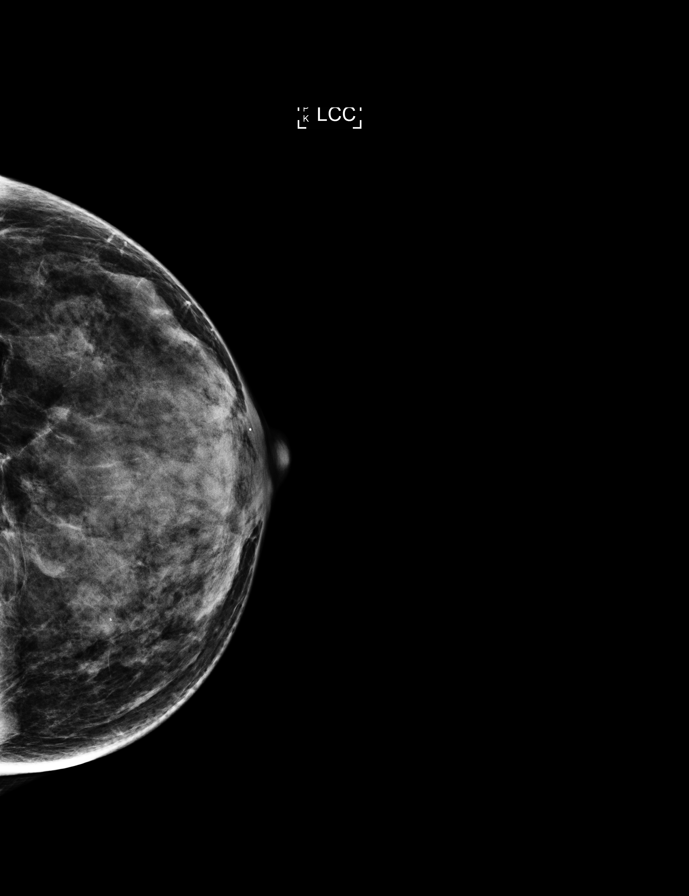

[L CC tomo · 2 of 42 frames shown]
[frame 14/42]
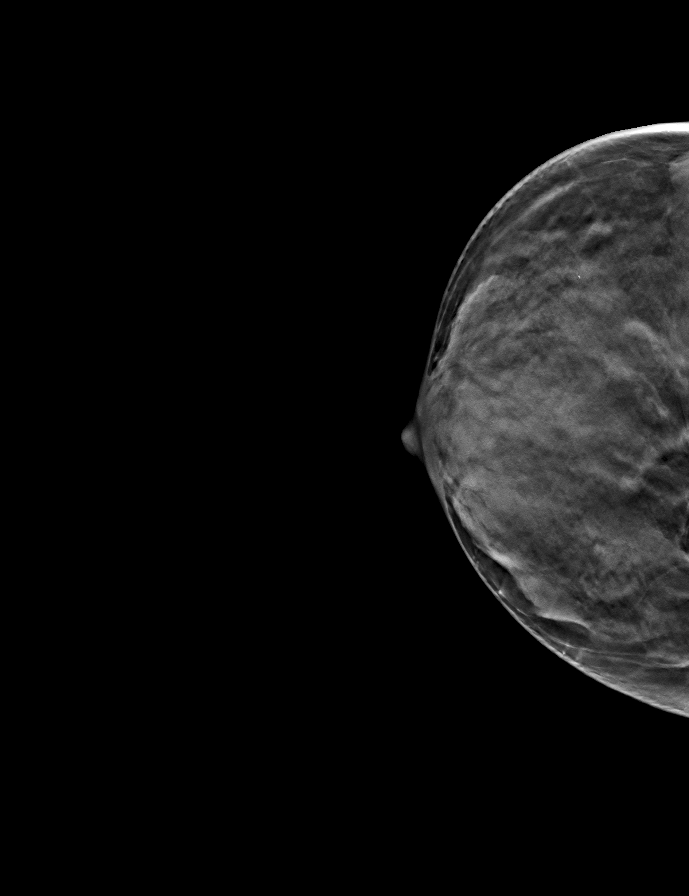
[frame 21/42]
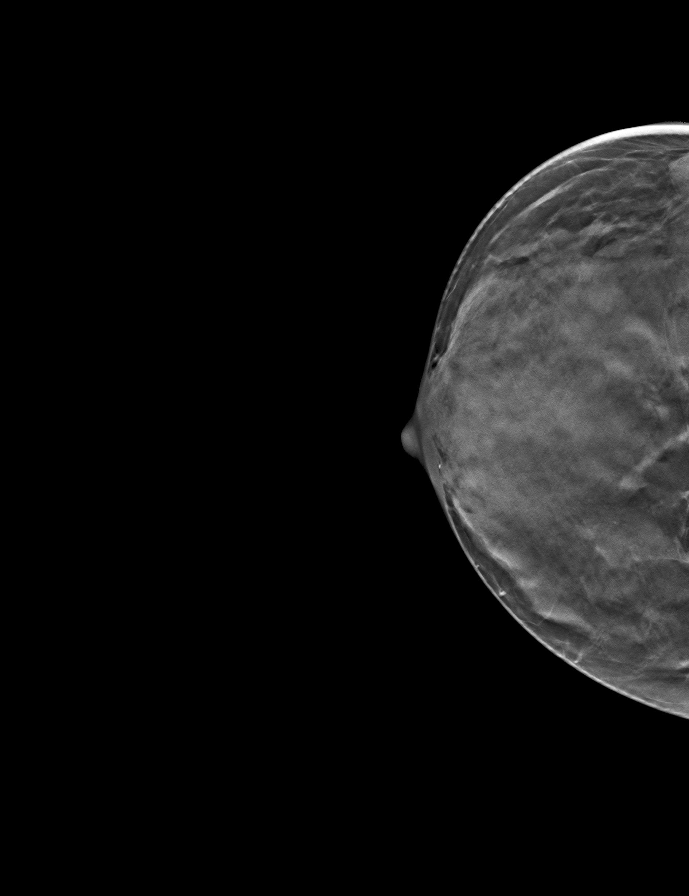

[L MLO tomo · tomo slice 22/43.0]
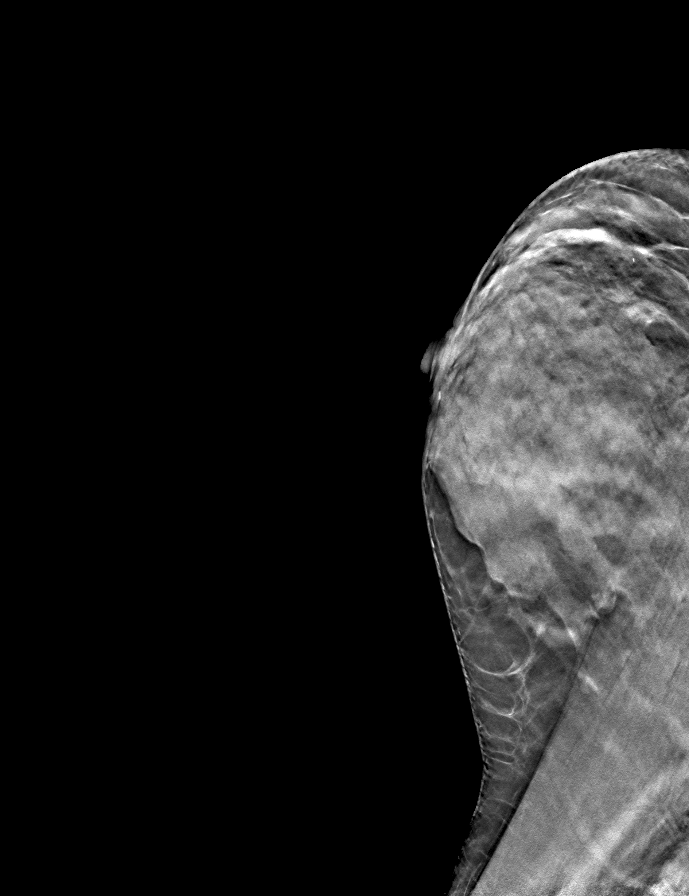

[R CC tomo · tomo slice 23/44.0]
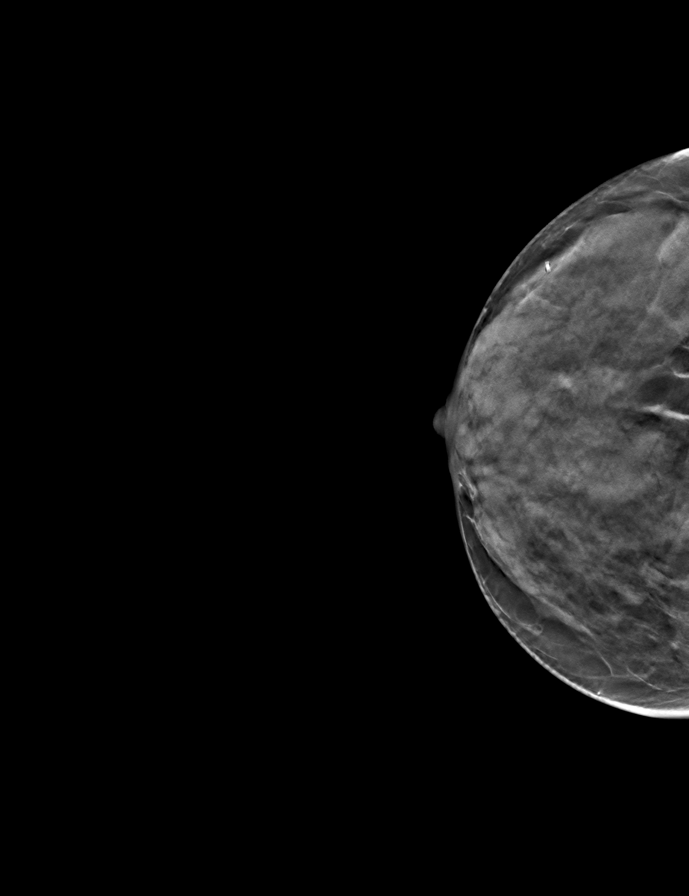

[R MLO tomo · tomo slice 21/42.0]
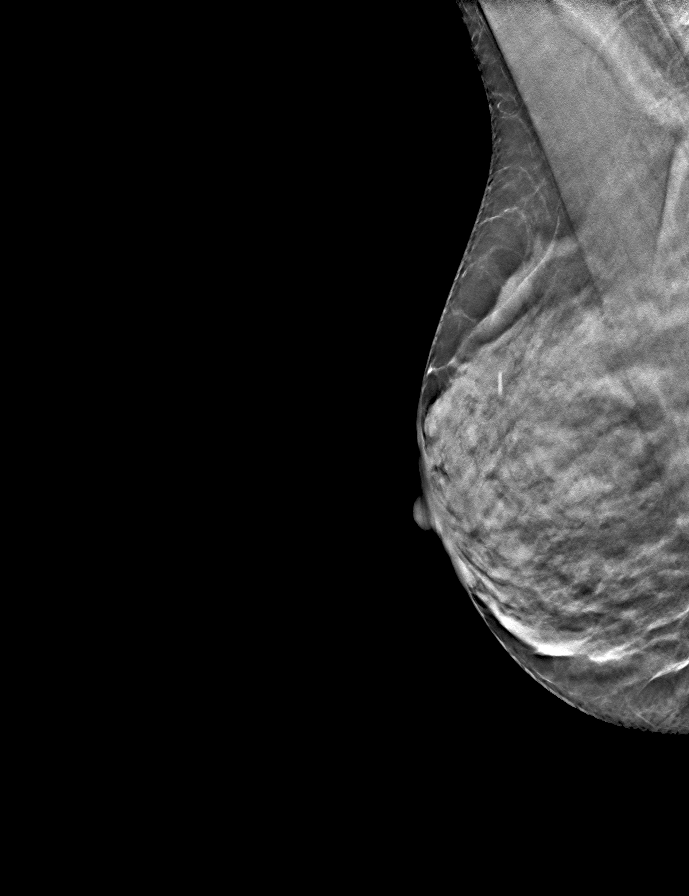

[9 of 24 positions shown; findings below may reference images not displayed]

ACR Breast Density Category d: The breast tissue is extremely dense,
which lowers the sensitivity of mammography.
FINDINGS: In the left breast, a possible mass warrants further evaluation. In
the right breast, no findings suspicious for malignancy.

Images were processed with CAD.
IMPRESSION: Further evaluation is suggested for possible mass in the left
breast.

RECOMMENDATION:
Diagnostic mammogram and possibly ultrasound of the left breast.
(Code:[14])

The patient will be contacted regarding the findings, and additional
imaging will be scheduled.

BI-RADS CATEGORY  0: Incomplete. Need additional imaging evaluation
and/or prior mammograms for comparison.

## 2015-03-19 ENCOUNTER — Other Ambulatory Visit: Payer: Self-pay | Admitting: Physician Assistant

## 2015-03-19 ENCOUNTER — Ambulatory Visit: Payer: 59

## 2015-03-29 ENCOUNTER — Other Ambulatory Visit: Payer: Self-pay | Admitting: Physician Assistant

## 2015-03-29 DIAGNOSIS — R928 Other abnormal and inconclusive findings on diagnostic imaging of breast: Secondary | ICD-10-CM

## 2015-03-30 ENCOUNTER — Ambulatory Visit
Admission: RE | Admit: 2015-03-30 | Discharge: 2015-03-30 | Disposition: A | Discharge status: Home / Self Care | Payer: 59 | Source: Ambulatory Visit | Attending: Physician Assistant | Admitting: Physician Assistant

## 2015-03-30 DIAGNOSIS — R928 Other abnormal and inconclusive findings on diagnostic imaging of breast: Secondary | ICD-10-CM

## 2015-03-30 IMAGING — MG MM DIAG BREAST TOMO UNI LEFT
6 series · 6 of 18 positions shown · non-contrast
Comparison: Previous exam(s).

CLINICAL DATA: Patient returns today to evaluate a possible
asymmetry or mass within the inner left breast at far posterior
depth on recent screening mammogram.

EXAM:
DIGITAL DIAGNOSTIC LEFT MAMMOGRAM WITH 3D TOMOSYNTHESIS WITH CAD
ULTRASOUND LEFT BREAST

[L CC]
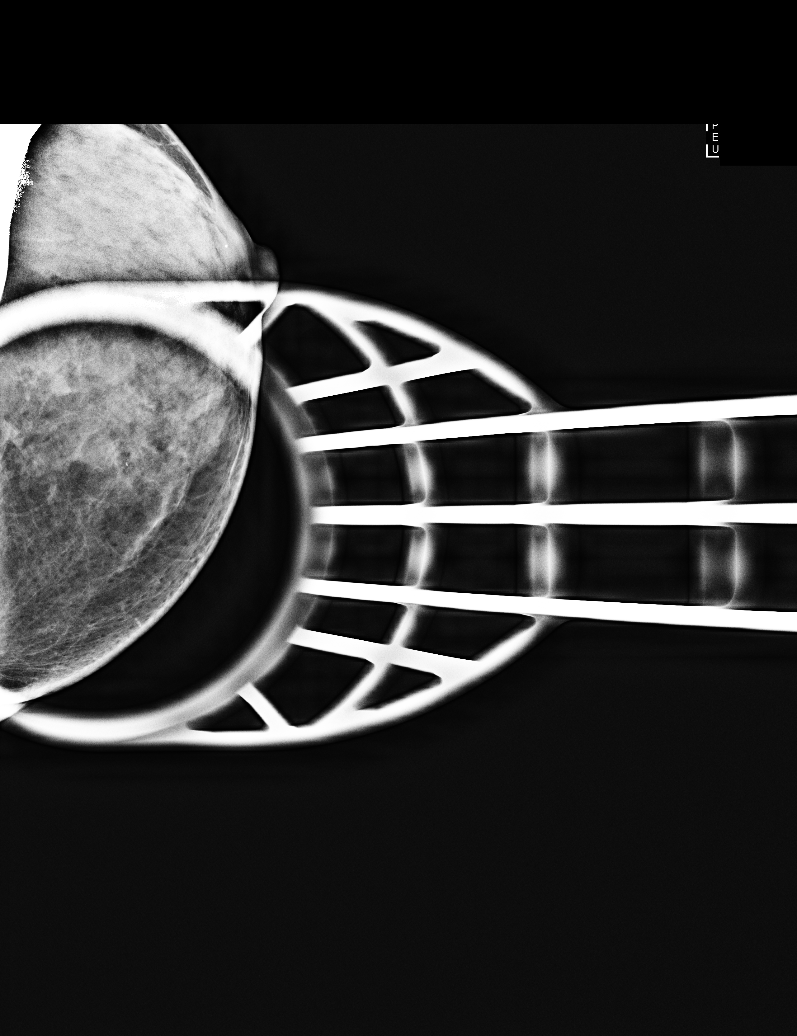

[L ML]
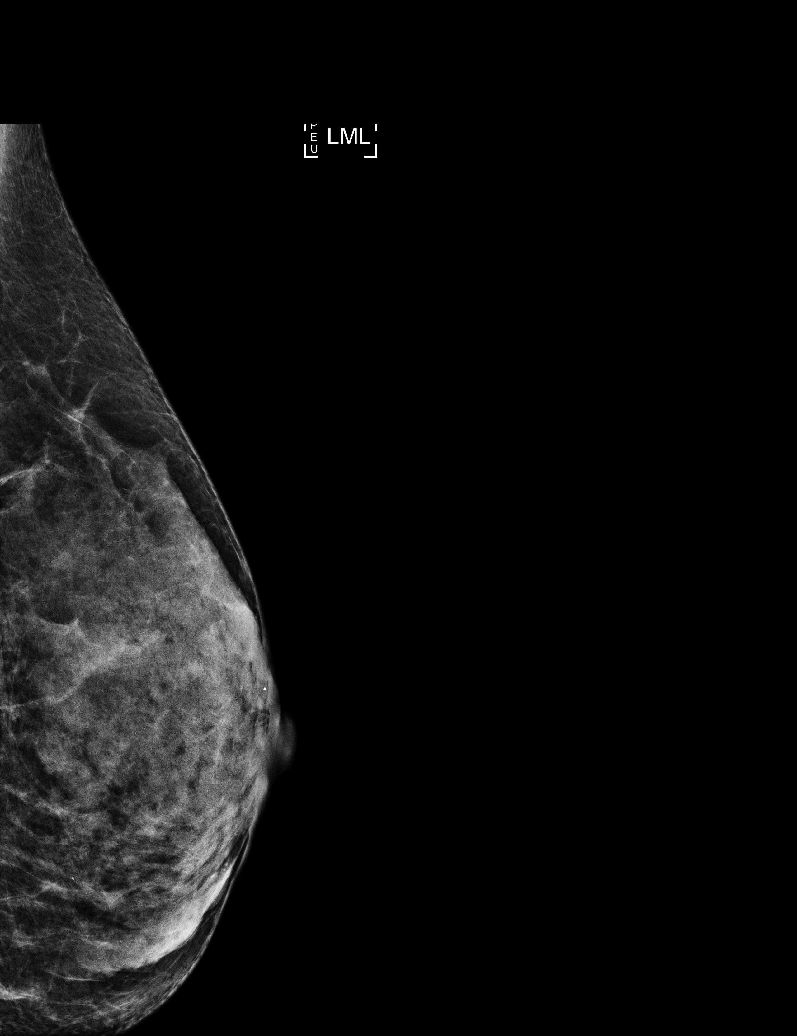

[L XCCM]
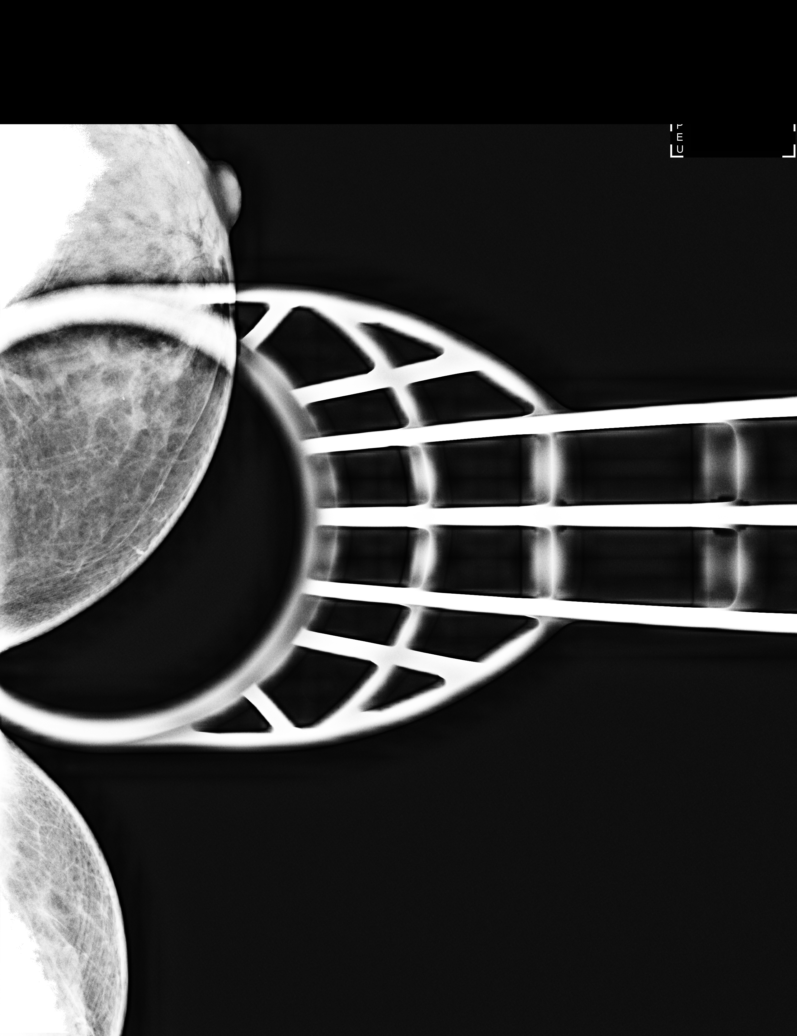

[L CC tomo · tomo slice 16/31.0]
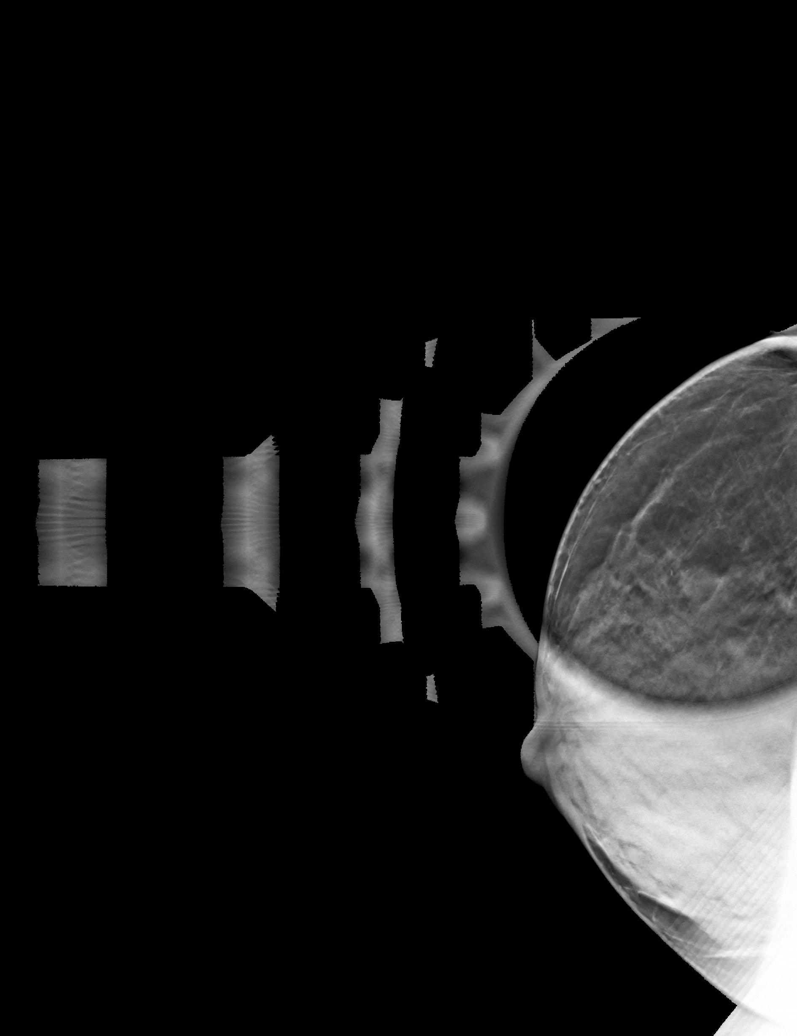

[L ML tomo · tomo slice 19/36.0]
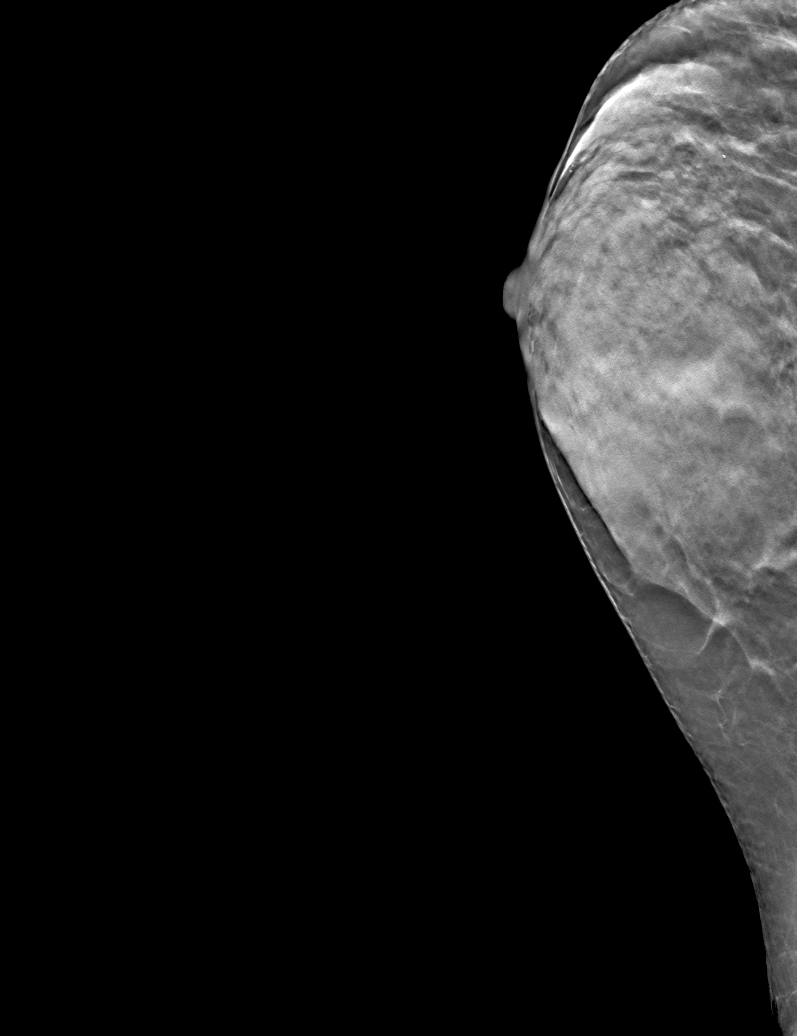

[L XCCM BREAST TOMOSYNTHESIS IMAGE tomo · tomo slice 15/30.0]
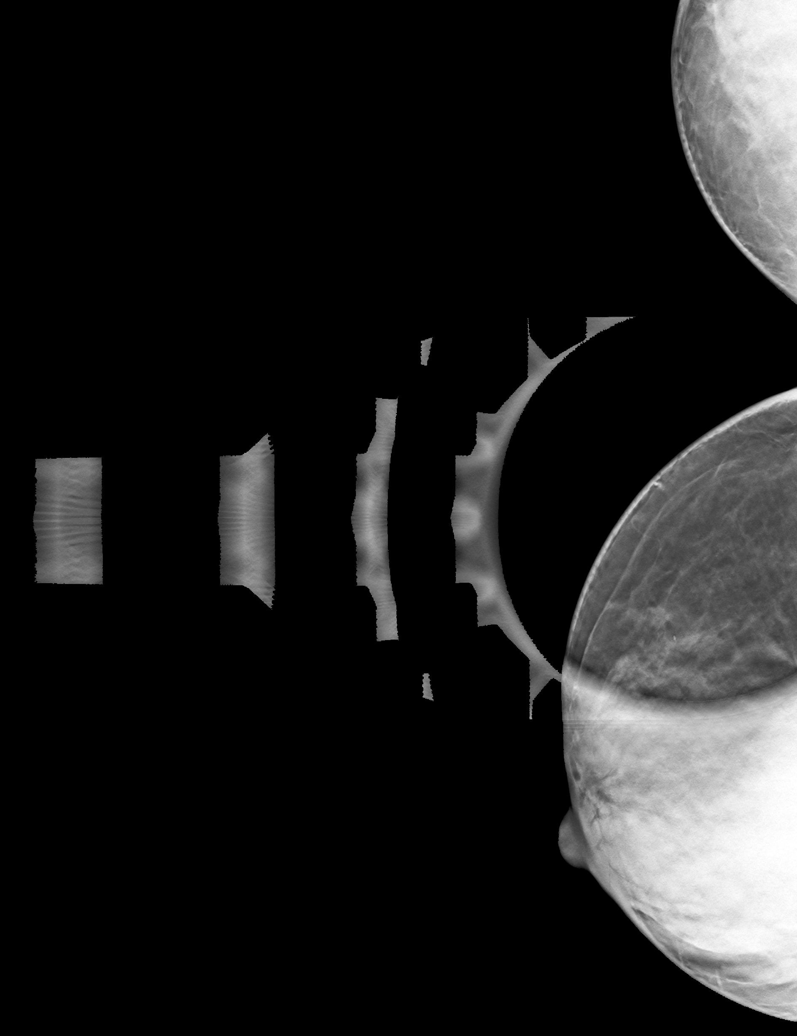

[6 of 18 positions shown; findings below may reference images not displayed]

ACR Breast Density Category d: The breast tissue is extremely dense,
which lowers the sensitivity of mammography.
FINDINGS: On today's additional views with spot compression and 3D
tomosynthesis, there is no persistent abnormality identified within
the inner left breast, at far posterior depth, indicating either
superimposition of normal dense fibroglandular tissues or, more
likely, a slip of muscle. Ultrasound will be performed to ensure
benignity.

Mammographic images were processed with CAD.

On physical exam, no palpable abnormality is felt within the

Targeted ultrasound is performed, evaluating the inner left breast,
showing only normal fibroglandular tissues and fat lobules
throughout. No suspicious solid or cystic mass is identified.
IMPRESSION: No mammographic or sonographic evidence of malignancy within the
inner left breast.

Patient may return to routine annual bilateral screening mammogram
schedule.

RECOMMENDATION:
Screening mammogram in one year.(Code:[SX])

I have discussed the findings and recommendations with the patient.
Results were also provided in writing at the conclusion of the
visit. If applicable, a reminder letter will be sent to the patient
regarding the next appointment.

BI-RADS CATEGORY  1: Negative.

## 2015-06-15 DIAGNOSIS — M720 Palmar fascial fibromatosis [Dupuytren]: Secondary | ICD-10-CM | POA: Insufficient documentation

## 2015-07-06 ENCOUNTER — Other Ambulatory Visit: Payer: Self-pay | Admitting: Cardiology

## 2015-07-06 ENCOUNTER — Other Ambulatory Visit: Payer: Self-pay

## 2015-07-06 MED ORDER — METOPROLOL SUCCINATE ER 25 MG PO TB24: 12.5000 mg | ORAL_TABLET | Freq: Every day | ORAL | Status: DC

## 2015-08-27 ENCOUNTER — Ambulatory Visit (INDEPENDENT_AMBULATORY_CARE_PROVIDER_SITE_OTHER): Payer: 59 | Admitting: Cardiology

## 2015-08-27 ENCOUNTER — Encounter: Payer: Self-pay | Admitting: Cardiology

## 2015-08-27 VITALS — BP 116/64 | HR 56 | Ht 66.0 in | Wt 128.8 lb

## 2015-08-27 DIAGNOSIS — I471 Supraventricular tachycardia: Secondary | ICD-10-CM | POA: Diagnosis not present

## 2015-08-27 NOTE — Patient Instructions (Signed)
Medication Instructions:  You may stop your Metoprolol. Continue all other medications as listed.  Follow-Up: Follow up in 2 years with Dr. Marlou Porch.  You will receive a letter in the mail 2 months before you are due.  Please call us when you receive this letter to schedule your follow up appointment.  If you need a refill on your cardiac medications before your next appointment, please call your pharmacy.  Thank you for choosing [facility]!!

## 2015-08-27 NOTE — Progress Notes (Signed)
[place]. [address], [address] [place], [place]  [address] Phone: [phone] Fax:  [phone]  Date:  08/27/2015   ID:  Carrie Serrano, DOB 1951-02-11, MRN [id number]  PCP:  REDMON,NOELLE, PA-C   History of Present Illness: Carrie Serrano is a 65 y.o. female with history of supraventricular tachycardia/paroxysmal atrial tachycardia status post ablation in 2014 by Dr. Lovena Le here for followup. On 04/17/11 her EKG strip looked AVNRT and a heart rate of 160-170. On another visit she brought me another strip of her going into her arrhythmia. I do not see the onset of the arrhythmia but when compared to prior strip, her heart rate is actually in the 140s. I do not see a clear P-wave preceding the QRS complexes. She does terminate with a PVC. The remaining beats are in sinus rhythm. EPS, questionable success in lab but clinically she has improved dramatically.   She has been doing well. Dr. Owens Shark with her oral surgery now retired. She is working hard with Dr. Buelah Manis.  No chest pain, no shortness of breath, no syncope, no palpitations.    Wt Readings from Last 3 Encounters:  08/27/15 128 lb 12.8 oz (58.423 kg)  09/18/14 119 lb 1.9 oz (54.032 kg)  09/05/13 126 lb (57.153 kg)     Past Medical History  Diagnosis Date  . Osteopenia   . HSV-2 (herpes simplex virus 2) infection   . SVT (supraventricular tachycardia) ([place])   . PAT (paroxysmal atrial tachycardia) [place])     Past Surgical History  Procedure Laterality Date  . Ablation of dysrhythmic focus  06/13/2012    SVT    Dr  Lovena Le  . Abdominal hysterectomy    . Colonoscopy  2012  . Electrophysiology study N/A 06/13/2012    Procedure: ELECTROPHYSIOLOGY STUDY;  Surgeon: Evans Lance, MD;  Location: [facility] LAB;  Service: Cardiovascular;  Laterality: N/A;  . Supraventricular tachycardia ablation N/A 06/13/2012    Procedure: SUPRAVENTRICULAR TACHYCARDIA ABLATION;  Surgeon: Evans Lance, MD;  Location: [facility] CATH LAB;  Service:  Cardiovascular;  Laterality: N/A;    Current Outpatient Prescriptions  Medication Sig Dispense Refill  . metoprolol succinate (TOPROL-XL) 25 MG 24 hr tablet Take 0.5 tablets (12.5 mg total) by mouth daily. 45 tablet 0  . Multiple Vitamin (MULTIVITAMIN) tablet Take 1 tablet by mouth daily.    . valACYclovir (VALTREX) 500 MG tablet Take 500 mg by mouth 2 (two) times daily as needed. For outbreak     No current facility-administered medications for this visit.    Allergies:    Allergies  Allergen Reactions  . Latex Itching    Social History:  The patient  reports that she quit smoking about 15 months ago. Her smoking use included Cigarettes. She has a 12.5 pack-year smoking history. She has never used smokeless tobacco. She reports that she drinks about 4.2 - 5.4 oz of alcohol per week. She reports that she does not use illicit drugs.   Family History  Problem Relation Age of Onset  . Stroke Mother   . Diabetes Mother   . Heart attack Father   . Hypertension Father   Father - CHF  ROS:  Please see the history of present illness.   Denies any syncope, palpitations, shortness of breath.   All other systems reviewed and negative.   PHYSICAL EXAM: VS:  BP 116/64 mmHg  Pulse 56  Ht 5\' 6"  (1.676 m)  Wt 128 lb 12.8 oz (58.423  kg)  BMI 20.80 kg/m2 Well nourished, well developed, in no acute distress HEENT: normal, [place]/AT, EOMI Neck: no JVD, normal carotid upstroke, no bruit Cardiac:  normal S1, S2; RRR; no murmur Lungs:  clear to auscultation bilaterally, no wheezing, rhonchi or rales Abd: soft, nontender, no hepatomegaly, no bruits Ext: no edema, 2+ distal pulses Skin: warm and dry GU: deferred Neuro: no focal abnormalities noted, AAO x 3  EKG: EKG was ordered today. 08/27/15-sinus bradycardia rate 54, left axis deviation, incomplete right bundle branch block personally reviewed-prior 09/18/14-sinus bradycardia, left axis deviation, right bundle branch block-previously 09/05/13-sinus  bradycardia rate 57 with left anterior fascicular block, right bundle branch block, bifascicular block.     ASSESSMENT AND PLAN:  1. 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 Stable. 2. SVT-prior EP ablation-stopping 12.5 Toprol at night. 3. Tobacco cessation-great job. 4. Two year follow up.   Signed, Candee Furbish, MD [facility]  08/27/2015 2:48 PM

## 2015-10-12 ENCOUNTER — Other Ambulatory Visit: Payer: Self-pay | Admitting: Cardiology

## 2016-02-21 ENCOUNTER — Other Ambulatory Visit: Payer: Self-pay | Admitting: Physician Assistant

## 2016-02-21 DIAGNOSIS — Z1231 Encounter for screening mammogram for malignant neoplasm of breast: Secondary | ICD-10-CM

## 2016-03-30 ENCOUNTER — Ambulatory Visit
Admission: RE | Admit: 2016-03-30 | Discharge: 2016-03-30 | Disposition: A | Discharge status: Home / Self Care | Payer: 59 | Source: Ambulatory Visit | Attending: Physician Assistant | Admitting: Physician Assistant

## 2016-03-30 DIAGNOSIS — Z1231 Encounter for screening mammogram for malignant neoplasm of breast: Secondary | ICD-10-CM

## 2016-03-30 IMAGING — MG 2D DIGITAL SCREENING BILATERAL MAMMOGRAM WITH CAD AND ADJUNCT TO
9 of 12 series · 9 of 28 positions shown · non-contrast
Comparison: Previous exam(s).

CLINICAL DATA: Screening.

EXAM:
2D DIGITAL SCREENING BILATERAL MAMMOGRAM WITH CAD AND ADJUNCT TOMO

[R CC]
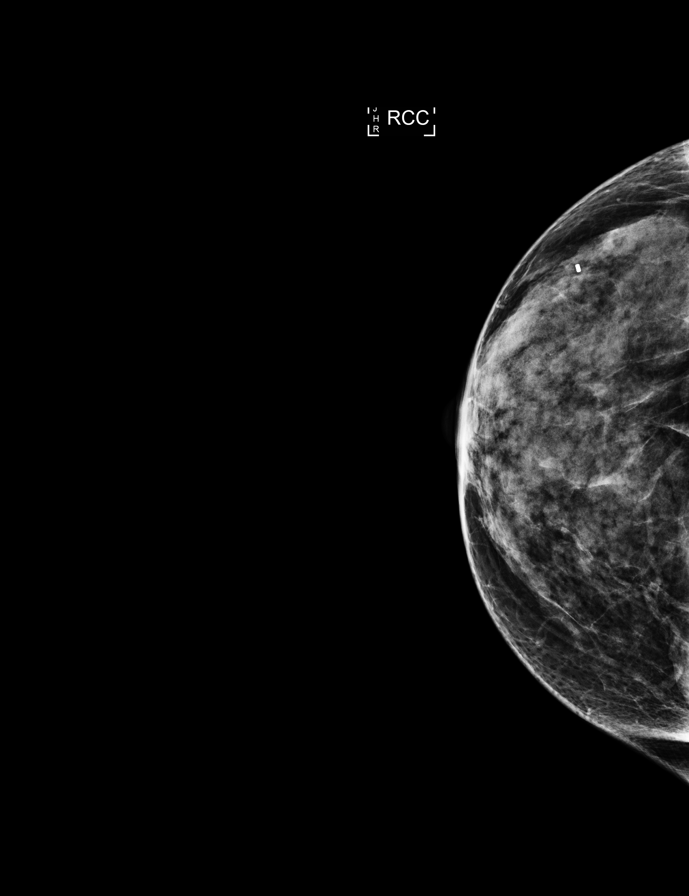

[R MLO]
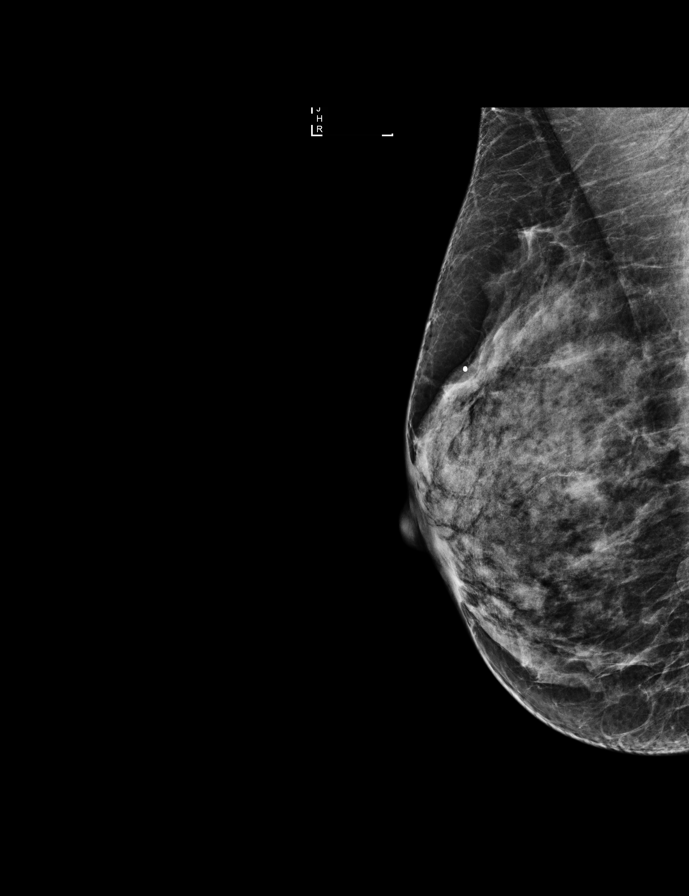

[R MLO synth-2D]
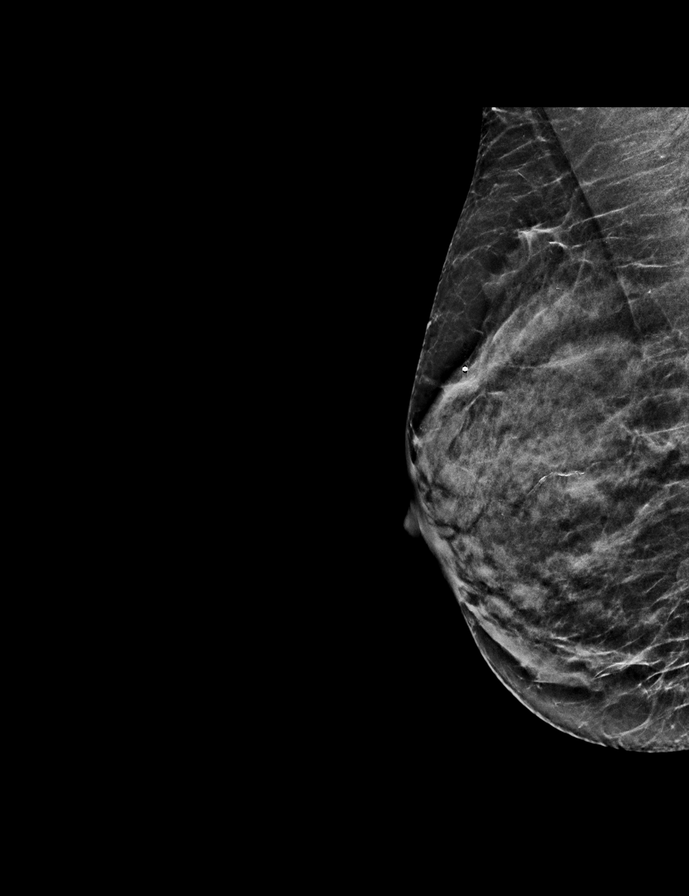

[L CC synth-2D]
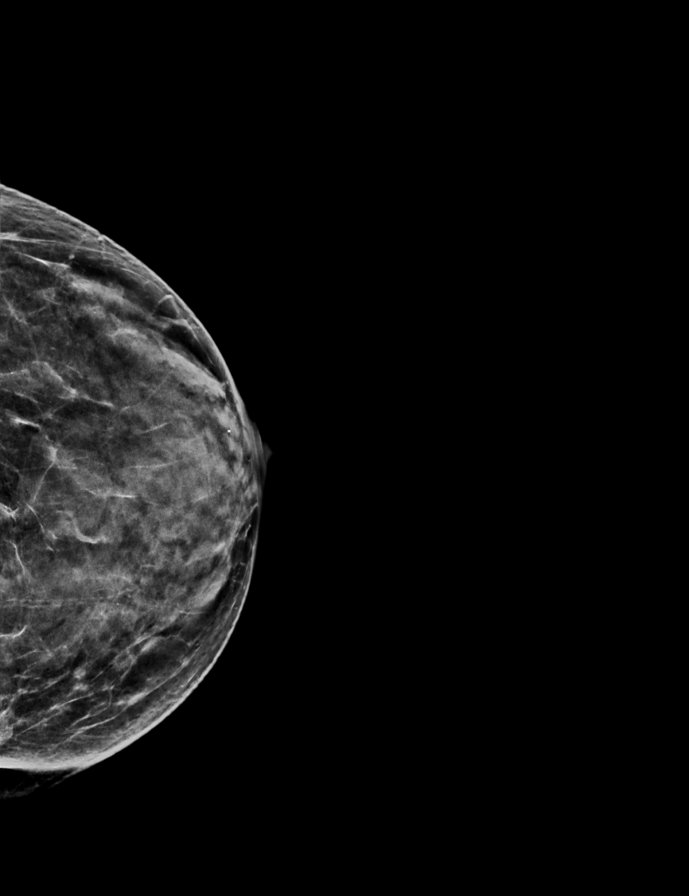

[L MLO synth-2D]
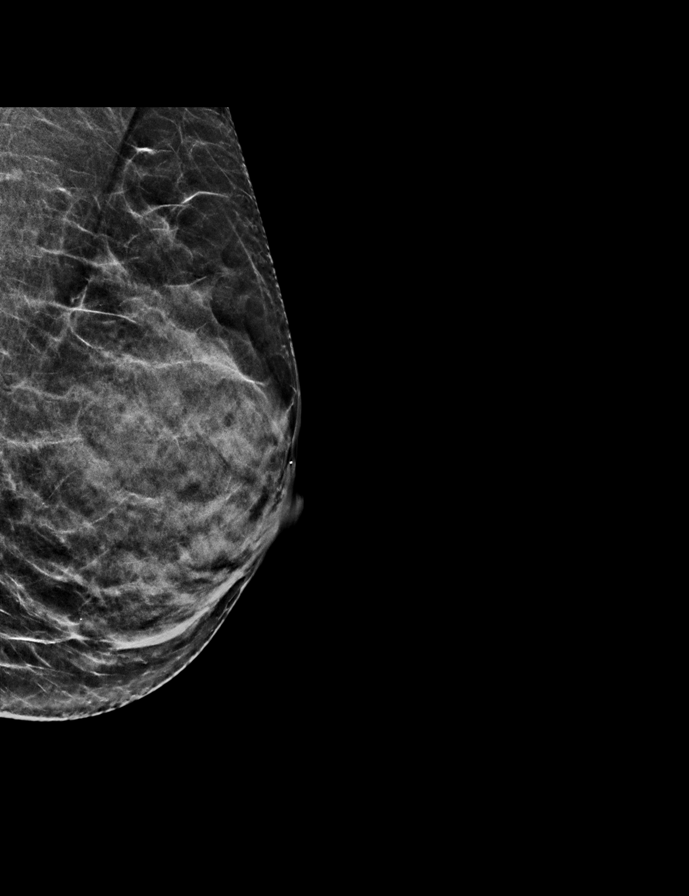

[L MLO]
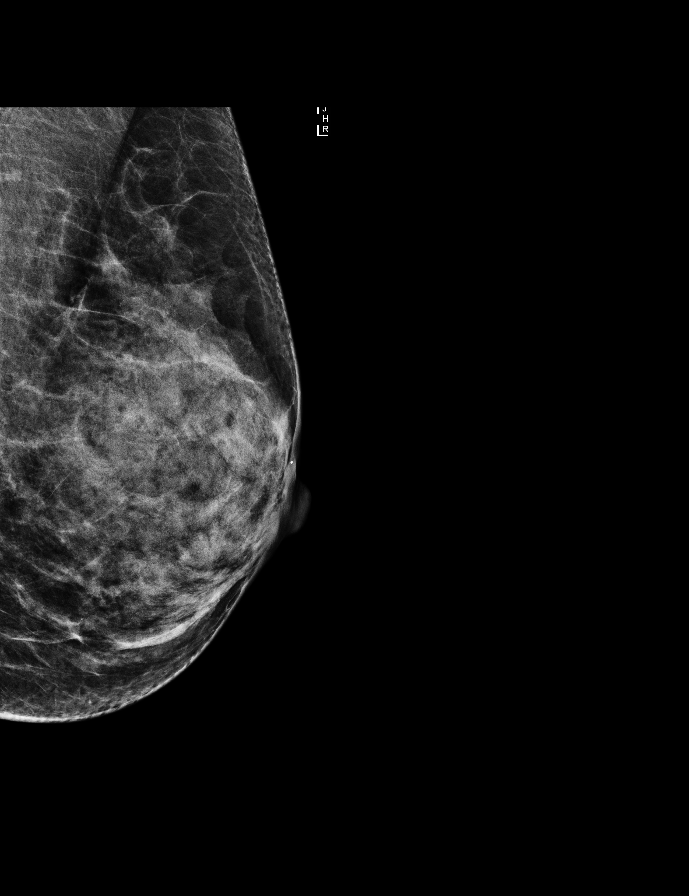

[R CC synth-2D]
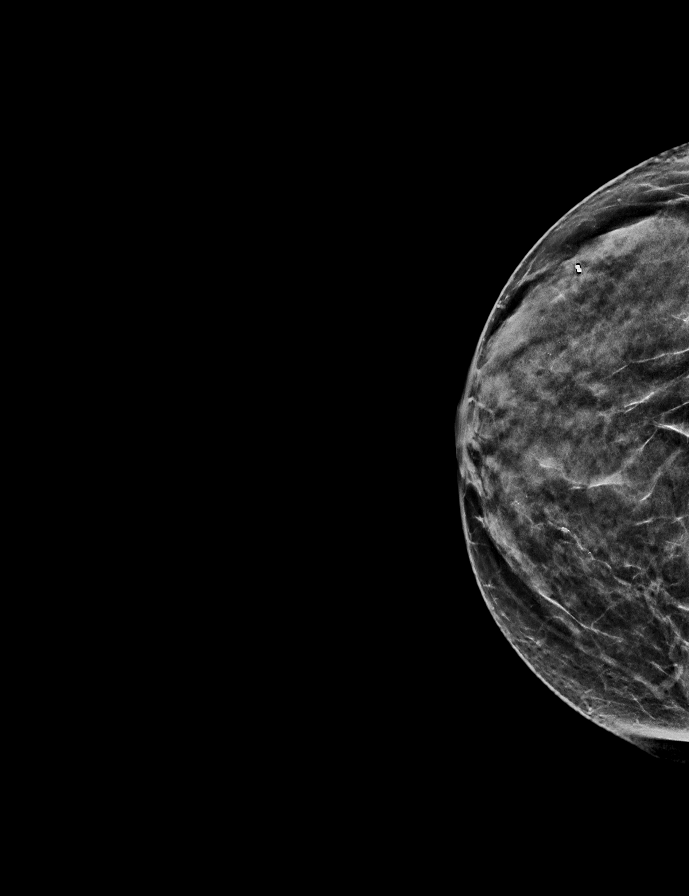

[L CC]
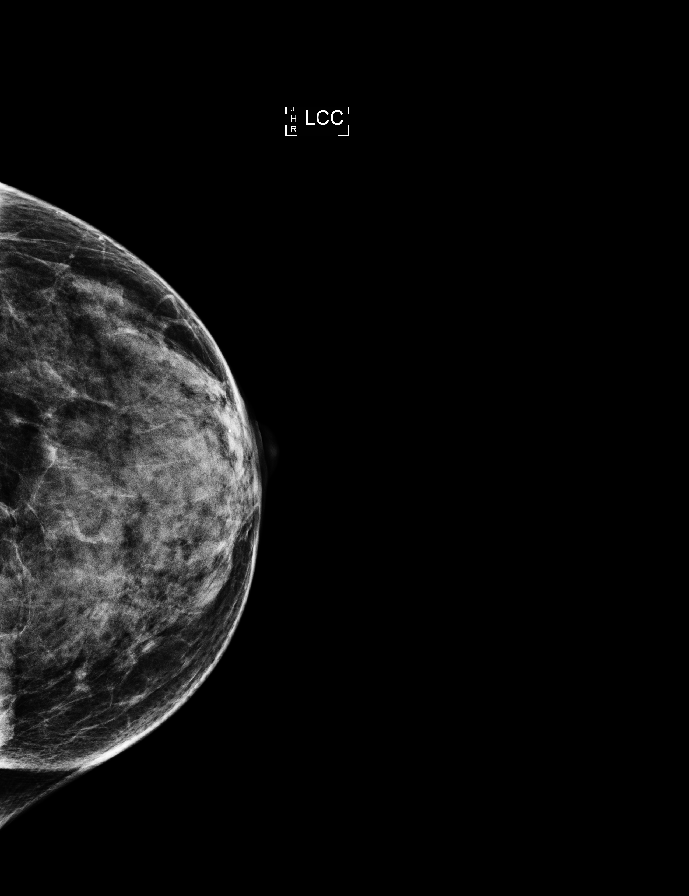

[L MLO tomo · tomo slice 24/47.0]
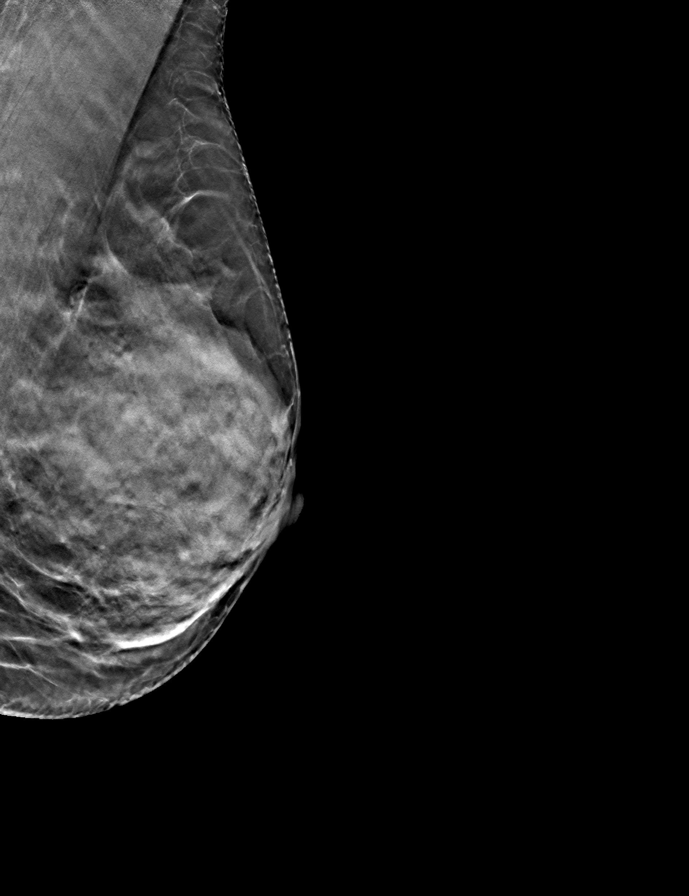

[9 of 28 positions shown; findings below may reference images not displayed]

ACR Breast Density Category d: The breast tissue is extremely dense,
which lowers the sensitivity of mammography.
FINDINGS: In the right breast, a possible mass warrants further evaluation. In
the left breast, no findings suspicious for malignancy. Images were
processed with CAD.
IMPRESSION: Further evaluation is suggested for possible mass in the right
breast.

RECOMMENDATION:
Diagnostic mammogram and possibly ultrasound of the right breast.
(Code:[5Z])

The patient will be contacted regarding the findings, and additional
imaging will be scheduled.

BI-RADS CATEGORY  0: Incomplete. Need additional imaging evaluation
and/or prior mammograms for comparison.

## 2016-04-03 ENCOUNTER — Other Ambulatory Visit: Payer: Self-pay | Admitting: Physician Assistant

## 2016-04-03 DIAGNOSIS — R928 Other abnormal and inconclusive findings on diagnostic imaging of breast: Secondary | ICD-10-CM

## 2016-04-06 ENCOUNTER — Ambulatory Visit
Admission: RE | Admit: 2016-04-06 | Discharge: 2016-04-06 | Disposition: A | Discharge status: Home / Self Care | Payer: 59 | Source: Ambulatory Visit | Attending: Physician Assistant | Admitting: Physician Assistant

## 2016-04-06 DIAGNOSIS — R928 Other abnormal and inconclusive findings on diagnostic imaging of breast: Secondary | ICD-10-CM

## 2016-04-06 IMAGING — MG 2D DIGITAL DIAGNOSTIC UNILATERAL RIGHT MAMMOGRAM WITH CAD AND AD
4 series · 4 of 12 positions shown · non-contrast
Comparison: Previous exam(s).

CLINICAL DATA: Possible mass in the central right breast on a
recent screening mammogram.

EXAM:
2D DIGITAL DIAGNOSTIC UNILATERAL RIGHT MAMMOGRAM WITH CAD AND
ADJUNCT TOMO

[R MLO]
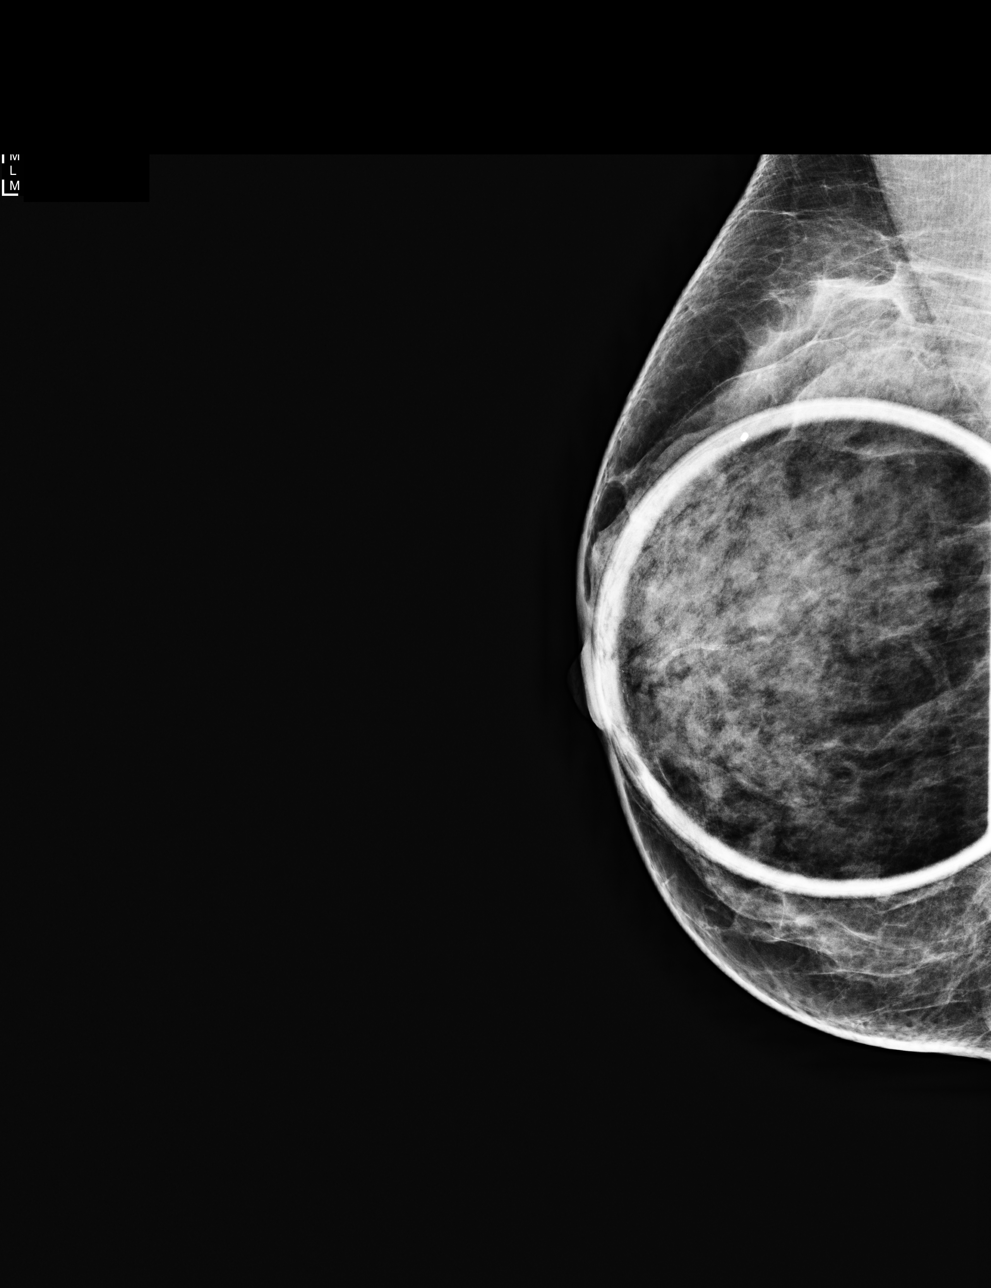

[R CC]
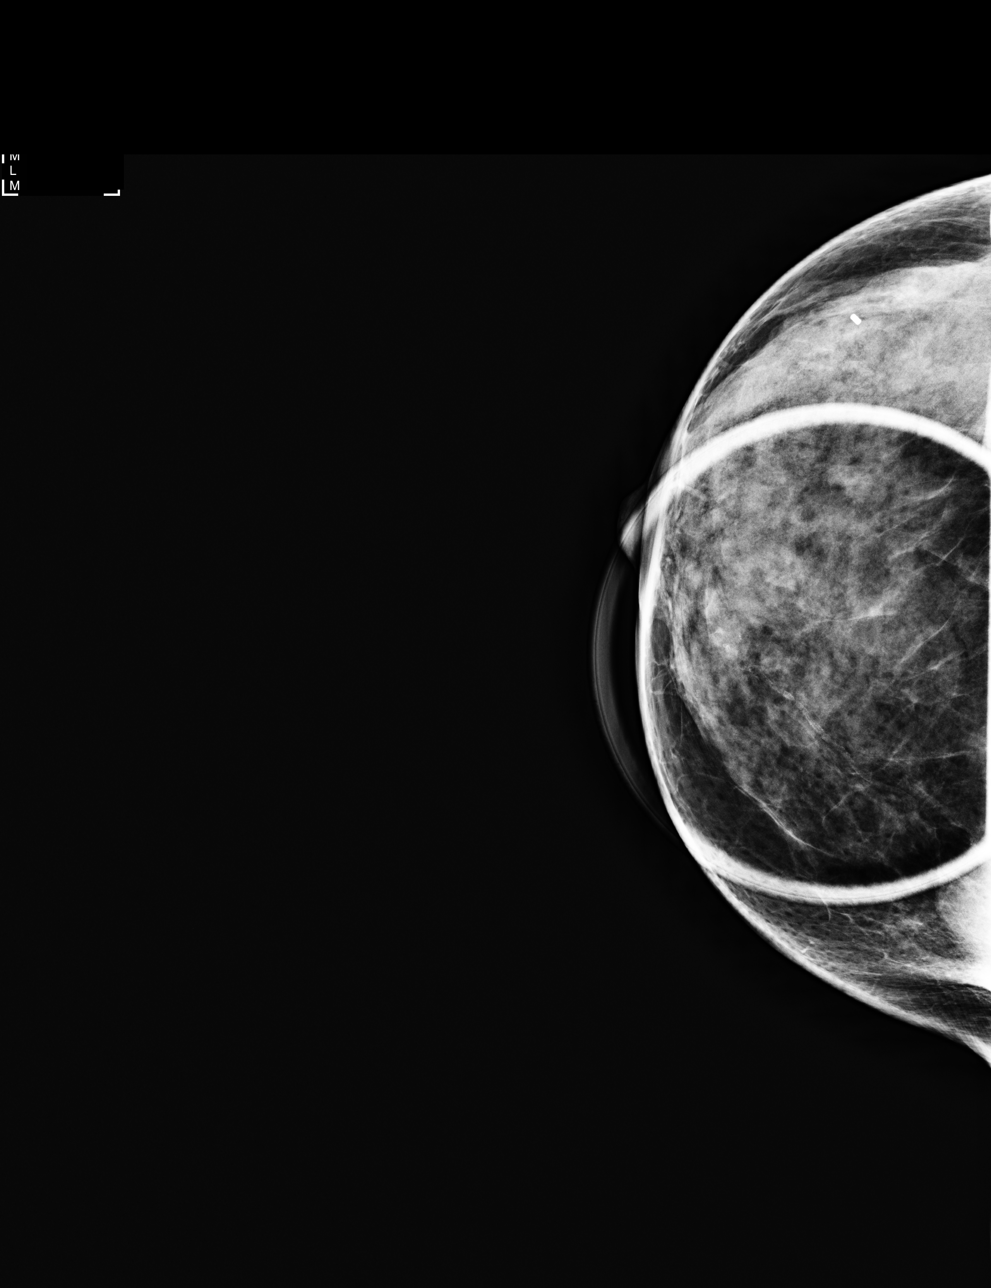

[R MLO tomo · tomo slice 19/36.0]
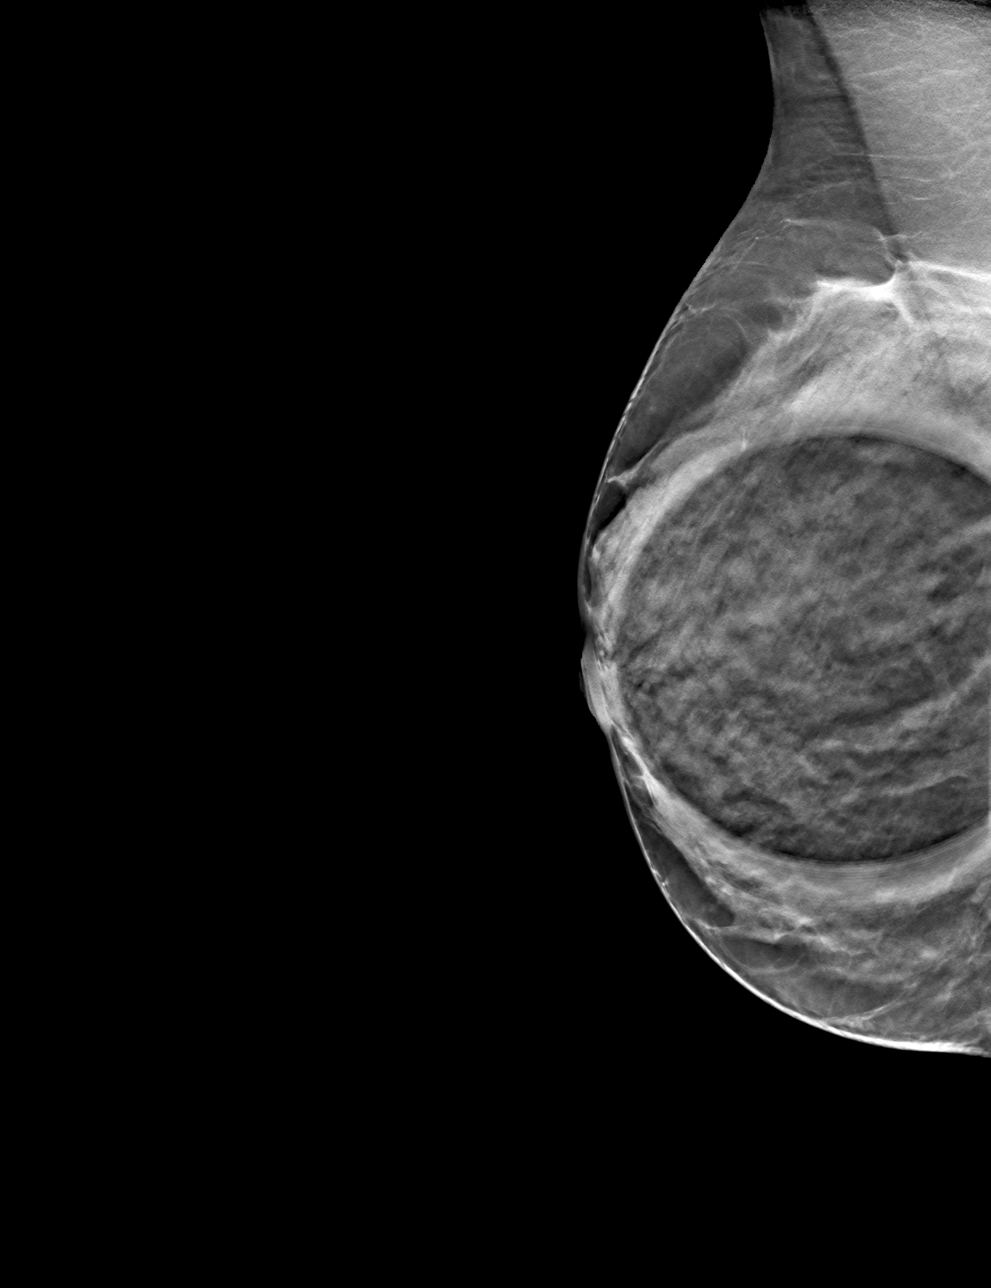

[R CC tomo · tomo slice 20/39.0]
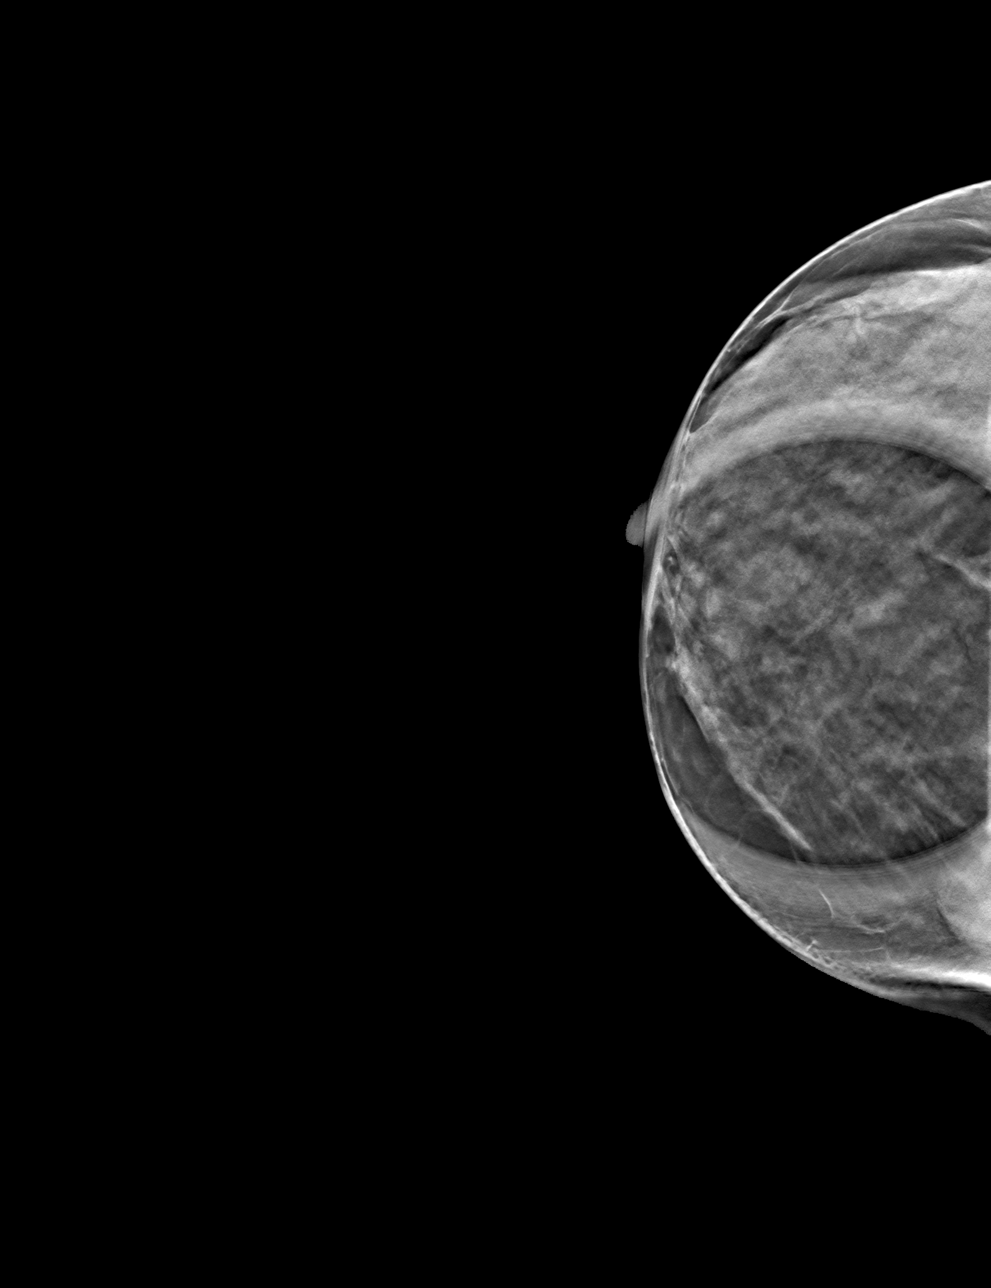

[4 of 12 positions shown; findings below may reference images not displayed]

ACR Breast Density Category d: The breast tissue is extremely dense,
which lowers the sensitivity of mammography.
FINDINGS: 2D and 3D spot compression views of the right breast demonstrate
normal appearing fibroglandular tissue at the location of the
recently suspected mass.

Mammographic images were processed with CAD.
IMPRESSION: No evidence of malignancy. The recently suspected right breast mass
was close apposition of normal fibroglandular tissue.

RECOMMENDATION:
Bilateral screening mammogram in 1 year.

I have discussed the findings and recommendations with the patient.
Results were also provided in writing at the conclusion of the
visit. If applicable, a reminder letter will be sent to the patient
regarding the next appointment.

BI-RADS CATEGORY  1: Negative.

## 2016-06-08 ENCOUNTER — Other Ambulatory Visit: Payer: Self-pay

## 2016-06-08 DIAGNOSIS — I83813 Varicose veins of bilateral lower extremities with pain: Secondary | ICD-10-CM | POA: Diagnosis not present

## 2016-06-08 DIAGNOSIS — L814 Other melanin hyperpigmentation: Secondary | ICD-10-CM | POA: Diagnosis not present

## 2016-06-08 DIAGNOSIS — I8393 Asymptomatic varicose veins of bilateral lower extremities: Secondary | ICD-10-CM

## 2016-06-08 DIAGNOSIS — L821 Other seborrheic keratosis: Secondary | ICD-10-CM | POA: Diagnosis not present

## 2016-06-15 DIAGNOSIS — M8588 Other specified disorders of bone density and structure, other site: Secondary | ICD-10-CM | POA: Diagnosis not present

## 2016-06-15 DIAGNOSIS — M810 Age-related osteoporosis without current pathological fracture: Secondary | ICD-10-CM | POA: Diagnosis not present

## 2016-06-23 ENCOUNTER — Encounter: Payer: Self-pay | Admitting: *Deleted

## 2016-06-23 ENCOUNTER — Encounter: Payer: Self-pay | Admitting: Vascular Surgery

## 2016-06-26 ENCOUNTER — Encounter: Payer: Self-pay | Admitting: *Deleted

## 2016-06-26 ENCOUNTER — Ambulatory Visit
Admission: RE | Admit: 2016-06-26 | Discharge: 2016-06-26 | Disposition: A | Discharge status: Home / Self Care | Payer: Commercial Managed Care - HMO | Source: Ambulatory Visit | Attending: Gastroenterology | Admitting: Gastroenterology

## 2016-06-26 ENCOUNTER — Encounter
Admission: RE | Disposition: A | Discharge status: Home / Self Care | Payer: Self-pay | Source: Ambulatory Visit | Attending: Gastroenterology

## 2016-06-26 ENCOUNTER — Ambulatory Visit: Payer: Commercial Managed Care - HMO | Admitting: Anesthesiology

## 2016-06-26 DIAGNOSIS — K6289 Other specified diseases of anus and rectum: Secondary | ICD-10-CM | POA: Insufficient documentation

## 2016-06-26 DIAGNOSIS — Z1211 Encounter for screening for malignant neoplasm of colon: Secondary | ICD-10-CM | POA: Diagnosis present

## 2016-06-26 DIAGNOSIS — I471 Supraventricular tachycardia: Secondary | ICD-10-CM | POA: Insufficient documentation

## 2016-06-26 DIAGNOSIS — Z79899 Other long term (current) drug therapy: Secondary | ICD-10-CM | POA: Insufficient documentation

## 2016-06-26 DIAGNOSIS — K635 Polyp of colon: Secondary | ICD-10-CM | POA: Diagnosis not present

## 2016-06-26 DIAGNOSIS — M8580 Other specified disorders of bone density and structure, unspecified site: Secondary | ICD-10-CM | POA: Diagnosis not present

## 2016-06-26 DIAGNOSIS — D122 Benign neoplasm of ascending colon: Secondary | ICD-10-CM | POA: Insufficient documentation

## 2016-06-26 DIAGNOSIS — Z955 Presence of coronary angioplasty implant and graft: Secondary | ICD-10-CM | POA: Diagnosis not present

## 2016-06-26 DIAGNOSIS — Z87891 Personal history of nicotine dependence: Secondary | ICD-10-CM | POA: Diagnosis not present

## 2016-06-26 DIAGNOSIS — K620 Anal polyp: Secondary | ICD-10-CM | POA: Diagnosis not present

## 2016-06-26 DIAGNOSIS — Z86010 Personal history of colonic polyps: Secondary | ICD-10-CM | POA: Insufficient documentation

## 2016-06-26 DIAGNOSIS — K6389 Other specified diseases of intestine: Secondary | ICD-10-CM | POA: Diagnosis not present

## 2016-06-26 HISTORY — PX: COLONOSCOPY WITH PROPOFOL: SHX5780

## 2016-06-26 SURGERY — COLONOSCOPY WITH PROPOFOL
Anesthesia: General

## 2016-06-26 MED ORDER — SODIUM CHLORIDE 0.9 % IV SOLN
INTRAVENOUS | Status: DC
  Administered 2016-06-26 (×2): via INTRAVENOUS

## 2016-06-26 MED ORDER — FENTANYL CITRATE (PF) 100 MCG/2ML IJ SOLN
INTRAMUSCULAR | Status: AC
  Filled 2016-06-26: qty 2

## 2016-06-26 MED ORDER — FENTANYL CITRATE (PF) 100 MCG/2ML IJ SOLN
INTRAMUSCULAR | Status: DC | PRN
  Administered 2016-06-26: 50 ug via INTRAVENOUS

## 2016-06-26 MED ORDER — SODIUM CHLORIDE 0.9 % IV SOLN: INTRAVENOUS | Status: DC

## 2016-06-26 MED ORDER — MIDAZOLAM HCL 2 MG/2ML IJ SOLN
INTRAMUSCULAR | Status: DC | PRN
Start: 2016-06-26 — End: 2016-06-26
  Administered 2016-06-26: 2 mg via INTRAVENOUS

## 2016-06-26 MED ORDER — MIDAZOLAM HCL 2 MG/2ML IJ SOLN
INTRAMUSCULAR | Status: AC
  Filled 2016-06-26: qty 2

## 2016-06-26 MED ORDER — PROPOFOL 10 MG/ML IV BOLUS
INTRAVENOUS | Status: DC | PRN
  Administered 2016-06-26: 40 mg via INTRAVENOUS

## 2016-06-26 MED ORDER — PROPOFOL 500 MG/50ML IV EMUL
INTRAVENOUS | Status: AC
  Filled 2016-06-26: qty 50

## 2016-06-26 MED ORDER — PROPOFOL 500 MG/50ML IV EMUL
INTRAVENOUS | Status: DC | PRN
  Administered 2016-06-26: 200 ug/kg/min via INTRAVENOUS

## 2016-06-26 NOTE — Anesthesia Procedure Notes (Signed)
Date/Time: 06/26/2016 1:30 PM Performed by: Allean Found Pre-anesthesia Checklist: Patient identified, Emergency Drugs available, Suction available, Patient being monitored and Timeout performed Patient Re-evaluated:Patient Re-evaluated prior to inductionOxygen Delivery Method: Nasal cannula

## 2016-06-26 NOTE — Anesthesia Preprocedure Evaluation (Signed)
Anesthesia Evaluation  Patient identified by MRN, date of birth, ID band Patient awake    Reviewed: Allergy & Precautions, H&P , NPO status , Patient's Chart, lab work & pertinent test results, reviewed documented beta blocker date and time   History of Anesthesia Complications Negative for: history of anesthetic complications  Airway Mallampati: II  TM Distance: >3 FB Neck ROM: full    Dental  (+) Teeth Intact, Dental Advidsory Given 2 veneers:   Pulmonary neg pulmonary ROS, former smoker,           Cardiovascular Exercise Tolerance: Good (-) hypertension(-) angina(-) CAD, (-) Past MI, (-) Cardiac Stents and (-) CABG + dysrhythmias Supra Ventricular Tachycardia (-) Valvular Problems/Murmurs     Neuro/Psych negative neurological ROS  negative psych ROS   GI/Hepatic negative GI ROS, Neg liver ROS,   Endo/Other  negative endocrine ROS  Renal/GU negative Renal ROS  negative genitourinary   Musculoskeletal   Abdominal   Peds  Hematology negative hematology ROS (+)   Anesthesia Other Findings Past Medical History: No date: HSV-2 (herpes simplex virus 2) infection No date: Osteopenia No date: PAT (paroxysmal atrial tachycardia) (H[facility]) No date: SVT (supraventricular tachycardia) (HCC)   Reproductive/Obstetrics negative OB ROS                             Anesthesia Physical Anesthesia Plan  ASA: I  Anesthesia Plan: General   Post-op Pain Management:    Induction:   Airway Management Planned:   Additional Equipment:   Intra-op Plan:   Post-operative Plan:   Informed Consent: I have reviewed the patients History and Physical, chart, labs and discussed the procedure including the risks, benefits and alternatives for the proposed anesthesia with the patient or authorized representative who has indicated his/her understanding and acceptance.   Dental Advisory Given  Plan Discussed  with: Anesthesiologist, CRNA and Surgeon  Anesthesia Plan Comments:         Anesthesia Quick Evaluation

## 2016-06-26 NOTE — Op Note (Signed)
[facility] Patient Name: Carrie Serrano Procedure Date: 06/26/2016 1:23 PM MRN: [id number] Account #: [id number] Date of Birth: Sep 09, 1950 Admit Type: Outpatient Age: 66 Room: [facility] ENDO ROOM 3 Gender: Female Note Status: Finalized Procedure:            Colonoscopy Indications:          Personal history of colonic polyps Providers:            Lollie Sails, MD Referring MD:         Claudie Leach, MD (Referring MD) Medicines:            Monitored Anesthesia Care Complications:        No immediate complications. Procedure:            Pre-Anesthesia Assessment:                       - ASA Grade Assessment: I - A normal, healthy patient.                       After obtaining informed consent, the colonoscope was                        passed under direct vision. Throughout the procedure,                        the patient's blood pressure, pulse, and oxygen                        saturations were monitored continuously. The                        Colonoscope was introduced through the anus and                        advanced to the the cecum, identified by appendiceal                        orifice and ileocecal valve. The colonoscopy was                        performed without difficulty. The patient tolerated the                        procedure well. The quality of the bowel preparation                        was good. Findings:      A 2 mm polyp was found in the ascending colon. The polyp was sessile.       The polyp was removed with a cold biopsy forceps. Resection and       retrieval were complete.      small papillary lesion at the anal verge, biopsied with a cold forcep.      The exam was otherwise normal throughout the examined colon.      The digital rectal exam was normal. Impression:           - One 2 mm polyp in the ascending colon, removed with a                        cold biopsy forceps.  Resected and retrieved. Recommendation:        - Discharge patient to home.                       - Await pathology results. Procedure Code(s):    --- Professional ---                       [phone], Colonoscopy, flexible; with biopsy, single or                        multiple Diagnosis Code(s):    --- Professional ---                       D12.2, Benign neoplasm of ascending colon                       Z86.010, Personal history of colonic polyps CPT copyright 2016 American Medical Association. All rights reserved. The codes documented in this report are preliminary and upon coder review may  be revised to meet current compliance requirements. Lollie Sails, MD 06/26/2016 2:11:38 PM This report has been signed electronically. Number of Addenda: 0 Note Initiated On: 06/26/2016 1:23 PM Scope Withdrawal Time: 0 hours 12 minutes 53 seconds  Total Procedure Duration: 0 hours 29 minutes 28 seconds       [facility]

## 2016-06-26 NOTE — Anesthesia Post-op Follow-up Note (Cosign Needed)
Anesthesia QCDR form completed.        

## 2016-06-26 NOTE — H&P (Signed)
Outpatient short stay form Pre-procedure 06/26/2016 1:19 PM Lollie Sails MD  Primary Physician: Fawn Kirk PA  Reason for visit:  Colonoscopy  History of present illness:  Patient is a 66 year old female presenting today as above. She has personal history of adenomatous colon polyps. She tolerated her prep well. She takes no aspirin or blood thinning agents.    Current Facility-Administered Medications:  .  0.9 %  sodium chloride infusion, , Intravenous, Continuous, Lollie Sails, MD, Last Rate: 20 mL/hr at 06/26/16 1301 .  0.9 %  sodium chloride infusion, , Intravenous, Continuous, Lollie Sails, MD  Prescriptions Prior to Admission  Medication Sig Dispense Refill Last Dose  . valACYclovir (VALTREX) 500 MG tablet Take 500 mg by mouth 2 (two) times daily as needed. For outbreak   Past Week at Unknown time  . Multiple Vitamin (MULTIVITAMIN) tablet Take 1 tablet by mouth daily.   Not Taking at Unknown time  . Turmeric 500 MG TABS Take 538 mg by mouth.   Not Taking at Unknown time     Allergies  Allergen Reactions  . Latex Itching     Past Medical History:  Diagnosis Date  . HSV-2 (herpes simplex virus 2) infection   . Osteopenia   . PAT (paroxysmal atrial tachycardia) ([place])   . SVT (supraventricular tachycardia) (H[facility])     Review of systems:      Physical Exam    Heart and lungs: Regular rate and rhythm without rub or gallop, lungs are bilaterally clear.    HEENT: Normocephalic atraumatic eyes are anicteric    Other:     Pertinant exam for procedure: Soft nontender nondistended bowel sounds positive normoactive.    Planned proceedures: Last been indicated procedures. I have discussed the risks benefits and complications of procedures to include not limited to bleeding, infection, perforation and the risk of sedation and the patient wishes to proceed.    Lollie Sails, MD Gastroenterology 06/26/2016  1:19 PM

## 2016-06-26 NOTE — Transfer of Care (Signed)
Immediate Anesthesia Transfer of Care Note  Patient: Carrie Serrano  Procedure(s) Performed: Procedure(s): COLONOSCOPY WITH PROPOFOL (N/A)  Patient Location: PACU  Anesthesia Type:General  Level of Consciousness: sedated  Airway & Oxygen Therapy: Patient Spontanous Breathing and Patient connected to nasal cannula oxygen  Post-op Assessment: Report given to RN and Post -op Vital signs reviewed and stable  Post vital signs: Reviewed and stable  Last Vitals:  Vitals:   06/26/16 1242  BP: 112/79  Pulse: 91  Resp: 16  Temp: 36.6 C    Last Pain:  Vitals:   06/26/16 1242  TempSrc: Tympanic         Complications: No apparent anesthesia complications

## 2016-06-27 ENCOUNTER — Encounter: Payer: Self-pay | Admitting: Gastroenterology

## 2016-06-28 NOTE — Anesthesia Postprocedure Evaluation (Signed)
Anesthesia Post Note  Patient: Carrie Serrano  Procedure(s) Performed: Procedure(s) (LRB): COLONOSCOPY WITH PROPOFOL (N/A)  Patient location during evaluation: Endoscopy Anesthesia Type: General Level of consciousness: awake and alert Pain management: pain level controlled Vital Signs Assessment: post-procedure vital signs reviewed and stable Respiratory status: spontaneous breathing, nonlabored ventilation, respiratory function stable and patient connected to nasal cannula oxygen Cardiovascular status: blood pressure returned to baseline and stable Postop Assessment: no signs of nausea or vomiting Anesthetic complications: no     Last Vitals:  Vitals:   06/26/16 1425 06/26/16 1435  BP: 107/75 (!) 116/103  Pulse: 65 (!) 58  Resp: (!) 21 18  Temp:      Last Pain:  Vitals:   06/26/16 1415  TempSrc: Tympanic                 Martha Clan

## 2016-06-29 ENCOUNTER — Ambulatory Visit (HOSPITAL_COMMUNITY)
Admission: RE | Admit: 2016-06-29 | Discharge: 2016-06-29 | Disposition: A | Discharge status: Home / Self Care | Payer: 59 | Source: Ambulatory Visit | Attending: Vascular Surgery | Admitting: Vascular Surgery

## 2016-06-29 DIAGNOSIS — I8393 Asymptomatic varicose veins of bilateral lower extremities: Secondary | ICD-10-CM | POA: Diagnosis not present

## 2016-06-29 LAB — SURGICAL PATHOLOGY

## 2016-06-30 ENCOUNTER — Ambulatory Visit (INDEPENDENT_AMBULATORY_CARE_PROVIDER_SITE_OTHER): Payer: 59 | Admitting: Vascular Surgery

## 2016-06-30 ENCOUNTER — Encounter: Payer: Self-pay | Admitting: Vascular Surgery

## 2016-06-30 VITALS — BP 127/76 | HR 63 | Temp 98.0°F | Resp 16 | Ht <= 58 in | Wt 130.0 lb

## 2016-06-30 DIAGNOSIS — I83893 Varicose veins of bilateral lower extremities with other complications: Secondary | ICD-10-CM

## 2016-06-30 DIAGNOSIS — I872 Venous insufficiency (chronic) (peripheral): Secondary | ICD-10-CM

## 2016-06-30 NOTE — Progress Notes (Signed)
Referred by:  [name], PA-C [address] [place], [place] [address]  Reason for referral: enlarging varicose veins in left leg   History of Present Illness  Carrie Serrano is a 66 y.o. (06-Sep-1950) female who presents with chief complaint: enlarging varicose veins in left leg.  Patient notes, onset of swelling years associated with no obvious trigger.  The patient's symptoms include: increasing swelling varicosities in both legs with standing during the day.  The patient has had no history of DVT, no history of pregnancy, known history of varicose vein, no history of venous stasis ulcers, no history of  Lymphedema and no history of skin changes in lower legs.  There is known family history of venous disorders.  The patient has never used compression stockings in the past.   Past Medical History:  Diagnosis Date  . HSV-2 (herpes simplex virus 2) infection   . Osteopenia   . PAT (paroxysmal atrial tachycardia) ([place])   . SVT (supraventricular tachycardia) ([facility])     Past Surgical History:  Procedure Laterality Date  . ABDOMINAL HYSTERECTOMY    . ABLATION OF DYSRHYTHMIC FOCUS  06/13/2012   SVT    Dr  Lovena Le  . COLONOSCOPY  2012  . COLONOSCOPY WITH PROPOFOL N/A 06/26/2016   Procedure: COLONOSCOPY WITH PROPOFOL;  Surgeon: Lollie Sails, MD;  Location: [facility] ENDOSCOPY;  Service: Endoscopy;  Laterality: N/A;  . ELECTROPHYSIOLOGY STUDY N/A 06/13/2012   Procedure: ELECTROPHYSIOLOGY STUDY;  Surgeon: Evans Lance, MD;  Location: [facility] CATH LAB;  Service: Cardiovascular;  Laterality: N/A;  . SUPRAVENTRICULAR TACHYCARDIA ABLATION N/A 06/13/2012   Procedure: SUPRAVENTRICULAR TACHYCARDIA ABLATION;  Surgeon: Evans Lance, MD;  Location: [facility] CATH LAB;  Service: Cardiovascular;  Laterality: N/A;    Social History   Social History  . Marital status: Single    Spouse name: N/A  . Number of children: N/A  . Years of education: N/A   Occupational History  . Not on file.    Social History Main Topics  . Smoking status: Former Smoker    Packs/day: 0.50    Years: 25.00    Types: Cigarettes    Quit date: 05/01/2014  . Smokeless tobacco: Never Used     Comment: 5 cigarettes a day.  . Alcohol use 4.8 - 6.0 oz/week    7 - 9 Standard drinks or equivalent, 1 Cans of beer per week     Comment: one beer a day  . Drug use: No  . Sexual activity: Not on file   Other Topics Concern  . Not on file   Social History Narrative  . No narrative on file    Family History  Problem Relation Age of Onset  . Stroke Mother   . Diabetes Mother   . Heart attack Father   . Hypertension Father     Current Outpatient Prescriptions  Medication Sig Dispense Refill  . Turmeric 500 MG TABS Take 538 mg by mouth.    . valACYclovir (VALTREX) 500 MG tablet Take 500 mg by mouth 2 (two) times daily as needed. For outbreak    . Vitamin D, Cholecalciferol, 400 units CAPS Take 800 Units by mouth.     No current facility-administered medications for this visit.     Allergies  Allergen Reactions  . Latex Itching     REVIEW OF SYSTEMS:   Cardiac:  positive for: no symptoms, negative for: Chest pain or chest pressure, Shortness of breath upon exertion and Shortness  of breath when lying flat,   Vascular:  positive for: Pain in feet at night that wakes you up from your sleep,  negative for: Pain in calf, thigh, or hip brought on by ambulation, Blood clot in your veins and Leg swelling  Pulmonary:  positive for: no symptoms,  negative for: Oxygen at home, Productive cough and Wheezing  Neurologic:  positive for: No symptoms, negative for: Sudden weakness in arms or legs, Sudden numbness in arms or legs, Sudden onset of difficulty speaking or slurred speech, Temporary loss of vision in one eye and Problems with dizziness  Gastrointestinal:  positive for: no symptoms, negative for: Blood in stool and Vomited blood  Genitourinary:  positive for: no symptoms, negative  for: Burning when urinating and Blood in urine  Psychiatric:  positive for: no symptoms,  negative for: Major depression  Hematologic:  positive for: no symptoms,  negative for: negative for: Bleeding problems and Problems with blood clotting too easily  Dermatologic:  positive for: no symptoms, negative for: Rashes or ulcers  Constitutional:  positive for: no symptoms, negative for: Fever or chills  Ear/Nose/Throat:  positive for: no symptoms, negative for: Change in hearing, Nose bleeds and Sore throat  Musculoskeletal:  positive for: no symptoms, negative for: Back pain, Joint pain and Muscle pain   Physical Examination  Vitals:   06/30/16 1548  BP: 127/76  Pulse: 63  Resp: 16  Temp: 98 F (36.7 C)  TempSrc: Oral  SpO2: 97%  Weight: 130 lb (59 kg)  Height: 1' (0.305 m)    General: Alert, O x 3, WD, NAD  Head: [place]/AT,    Ear/Nose/Throat: Hearing grossly intact, nares without erythema or drainage, oropharynx without Erythema or Exudate, Mallampati score: 3, Dentition intact  Eyes: PERRLA, EOMI,    Neck: Supple, mid-line trachea,    Pulmonary: Sym exp, good B air movt, CTA B  Cardiac: RRR, Nl S1, S2, no Murmurs, No rubs, No S3,S4  Vascular: Vessel Right Left  Radial Palpable Palpable  Brachial Palpable Palpable  Carotid Palpable Palpable  Aorta Not palpable due to pannus N/A  Femoral Palpable Palpable  Popliteal Not palpable Not palpable  PT Palpable Palpable  DP Palpable Palpable   Gastrointestinal: soft, non-distended, non-tender to palpation, No guarding or rebound, no HSM, no masses, no CVAT B, No palpable prominent aortic pulse,    Musculoskeletal: M/S 5/5 throughout  , Extremities without ischemic changes  , No edema present, Varicosities present: moderate sized nests in L leg > R, No Lipodermatosclerosis present, lateral calf nest prominent and concerning for imminent bleed  Neurologic: Cranial nerves 2-12 intact , Pain and light touch  intact in extremities , Motor exam as listed above  Psychiatric: Judgement intact, Mood & affect appropriate for pt's clinical situation  Dermatologic: See M/S exam for extremity exam, No rashes otherwise noted  Lymph : Palpable lymph nodes: None   Non-Invasive Vascular Imaging  BLE Venous Insufficiency Duplex (Date: 06/30/2016):   RLE:   no DVT and SVT,   + GSV reflux: 2.5-3.8 mm  + SSV reflux: 2.3-5.1 mm  no deep venous reflux  LLE:  no DVT and SVT,   + GSV reflux: 2.9-4.8 mm  + SSV reflux: 2.6-3.1 mm  + deep venous reflux: CFV, PV   Medical Decision Making  TANVEE LAURO is a 66 y.o. female who presents with: BLE chronic venous insufficiency (C2 Ep As,d Pr), B varicose veins with complications   Based on the patient's history and examination, I  recommend: compressive therapy.  I have concerns that this patient is likely to have a significant bleed from the lateral nest of varicosities.    I discussed with the patient the use of her 20-30 mm thigh high compression stockings and need for 3 month trial of such.  The patient will follow up in 3 months with my partners in the [facility] for evaluation for: L GSV EVLA.   I suspect the R GSV does not need intervention in the near future..  Thank you for allowing [place] to participate in this patient's care.   Adele Barthel, MD, FACS [facility] Office: [phone] Pager: [phone]  06/30/2016, 4:23 PM

## 2016-07-14 DIAGNOSIS — M810 Age-related osteoporosis without current pathological fracture: Secondary | ICD-10-CM | POA: Diagnosis not present

## 2016-08-01 DIAGNOSIS — M810 Age-related osteoporosis without current pathological fracture: Secondary | ICD-10-CM | POA: Diagnosis not present

## 2016-08-14 DIAGNOSIS — R933 Abnormal findings on diagnostic imaging of other parts of digestive tract: Secondary | ICD-10-CM | POA: Diagnosis not present

## 2016-09-20 ENCOUNTER — Encounter: Payer: Self-pay | Admitting: Vascular Surgery

## 2016-10-03 ENCOUNTER — Encounter: Payer: Self-pay | Admitting: Vascular Surgery

## 2016-10-03 ENCOUNTER — Ambulatory Visit (INDEPENDENT_AMBULATORY_CARE_PROVIDER_SITE_OTHER): Payer: 59 | Admitting: Vascular Surgery

## 2016-10-03 VITALS — BP 108/72 | Temp 97.2°F | Resp 16 | Ht 66.0 in | Wt 126.0 lb

## 2016-10-03 DIAGNOSIS — I83893 Varicose veins of bilateral lower extremities with other complications: Secondary | ICD-10-CM | POA: Diagnosis not present

## 2016-10-03 NOTE — Progress Notes (Signed)
[facility] [place]  Patient name: Carrie Serrano MRN: [id number] DOB: 02/17/1951 Sex: female  REASON FOR VISIT: Follow-up venous pathology  HPI: Carrie Serrano is a 66 y.o. female here today for follow-up. She continues to have discomfort in her left pretibial clot nested varicosities despite compression garments. She stands her great toe. 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 This does cause of difficulty with this.  Past Medical History:  Diagnosis Date  . HSV-2 (herpes simplex virus 2) infection   . Osteopenia   . PAT (paroxysmal atrial tachycardia) ([facility])   . SVT (supraventricular tachycardia) ([facility])     Family History  Problem Relation Age of Onset  . Stroke Mother   . Diabetes Mother   . Heart attack Father   . Hypertension Father     SOCIAL HISTORY: Social History  Substance Use Topics  . Smoking status: Former Smoker    Packs/day: 0.50    Years: 25.00    Types: Cigarettes    Quit date: 05/01/2014  . Smokeless tobacco: Never Used     Comment: 5 cigarettes a day.  . Alcohol use 4.8 - 6.0 oz/week    7 - 9 Standard drinks or equivalent, 1 Cans of beer per week     Comment: one beer a day    Allergies  Allergen Reactions  . Latex Itching    Current Outpatient Prescriptions  Medication Sig Dispense Refill  . denosumab (PROLIA) 60 MG/ML SOLN injection Inject 60 mg into the skin every 6 (six) months. Administer in upper arm, thigh, or abdomen    . Turmeric 500 MG TABS Take 538 mg by mouth.    . valACYclovir (VALTREX) 500 MG tablet Take 500 mg by mouth 2 (two) times daily as needed. For outbreak    . Vitamin D, Cholecalciferol, 400 units CAPS Take 800 Units by mouth.     No current facility-administered medications for this visit.     REVIEW OF SYSTEMS:  [X]  denotes positive finding, [ ]  denotes negative finding Cardiac  Comments:  Chest pain or chest pressure:    Shortness of  breath upon exertion:    Short of breath when lying flat:    Irregular heart rhythm:        Vascular    Pain in calf, thigh, or hip brought on by ambulation:    Pain in feet at night that wakes you up from your sleep:     Blood clot in your veins:    Leg swelling:  x         PHYSICAL EXAM: Vitals:   10/03/16 1543  BP: 108/72  Resp: 16  Temp: 97.2 F (36.2 C)  SpO2: 95%  Weight: 126 lb (57.2 kg)  Height: 5\' 6"  (1.676 m)    GENERAL: The patient is a well-nourished female, in no acute distress. The vital signs are documented above. 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 PULMONARY: There is good air exchange  MUSCULOSKELETAL: There are no major deformities or cyanosis. NEUROLOGIC: No focal weakness or paresthesias are detected. SKIN: There are no ulcers or rashes noted. PSYCHIATRIC: The patient has a normal affect.  DATA:  Reviewed her formal venous duplex. Also reimage this area with SonoSite ultrasound. 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  Plan:  I have recommended sclerotherapy therapy for these for symptomatic relief. Would in  all likelihood require 2 sessions to completely treat this. She wishes to proceed as soon as possible      Rosetta Posner, MD [facility] Tel [phone] Pager [phone]

## 2016-10-05 DIAGNOSIS — M25531 Pain in right wrist: Secondary | ICD-10-CM | POA: Diagnosis not present

## 2016-10-05 DIAGNOSIS — M720 Palmar fascial fibromatosis [Dupuytren]: Secondary | ICD-10-CM | POA: Diagnosis not present

## 2016-10-11 ENCOUNTER — Ambulatory Visit (INDEPENDENT_AMBULATORY_CARE_PROVIDER_SITE_OTHER): Payer: 59 | Admitting: *Deleted

## 2016-10-11 DIAGNOSIS — I83893 Varicose veins of bilateral lower extremities with other complications: Secondary | ICD-10-CM | POA: Diagnosis not present

## 2016-10-11 NOTE — Progress Notes (Signed)
X=.3% Sotradecol administered with a 27g butterfly.  Patient received a total of 18cc.  Treated all ares of concern on the left leg. Easy access. Tol well. Anticipate good results. Will see her again 11/22/16.    Compression stockings applied: Yes.

## 2016-10-17 ENCOUNTER — Encounter: Payer: Self-pay | Admitting: *Deleted

## 2016-11-08 ENCOUNTER — Encounter: Payer: Self-pay | Admitting: *Deleted

## 2016-11-16 DIAGNOSIS — L309 Dermatitis, unspecified: Secondary | ICD-10-CM | POA: Diagnosis not present

## 2016-11-16 DIAGNOSIS — L820 Inflamed seborrheic keratosis: Secondary | ICD-10-CM | POA: Diagnosis not present

## 2016-11-22 ENCOUNTER — Ambulatory Visit (INDEPENDENT_AMBULATORY_CARE_PROVIDER_SITE_OTHER): Payer: 59 | Admitting: *Deleted

## 2016-11-22 DIAGNOSIS — I83893 Varicose veins of bilateral lower extremities with other complications: Secondary | ICD-10-CM | POA: Diagnosis not present

## 2016-11-22 NOTE — Progress Notes (Signed)
X=.3% Sotradecol administered with a 27g butterfly.  Patient received a total of 6cc.  Cleaned up any remaining open vessels. All areas previously treated are resolving well. This is her last ins covered tx. Will follow prn.    Compression stockings applied: Yes.

## 2017-01-17 ENCOUNTER — Encounter: Admission: RE | Payer: Self-pay | Source: Ambulatory Visit

## 2017-01-17 ENCOUNTER — Ambulatory Visit: Admission: RE | Admit: 2017-01-17 | Payer: 59 | Source: Ambulatory Visit | Admitting: Gastroenterology

## 2017-01-17 SURGERY — SIGMOIDOSCOPY, FLEXIBLE
Anesthesia: Moderate Sedation

## 2017-01-24 ENCOUNTER — Encounter: Payer: Self-pay | Admitting: Anesthesiology

## 2017-01-24 ENCOUNTER — Encounter: Payer: Self-pay | Admitting: *Deleted

## 2017-01-24 ENCOUNTER — Ambulatory Visit
Admission: RE | Admit: 2017-01-24 | Discharge: 2017-01-24 | Disposition: A | Discharge status: Home / Self Care | Payer: Commercial Managed Care - HMO | Source: Ambulatory Visit | Attending: Internal Medicine | Admitting: Internal Medicine

## 2017-01-24 ENCOUNTER — Encounter
Admission: RE | Disposition: A | Discharge status: Home / Self Care | Payer: Self-pay | Source: Ambulatory Visit | Attending: Internal Medicine

## 2017-01-24 DIAGNOSIS — M8580 Other specified disorders of bone density and structure, unspecified site: Secondary | ICD-10-CM | POA: Diagnosis not present

## 2017-01-24 DIAGNOSIS — I471 Supraventricular tachycardia: Secondary | ICD-10-CM | POA: Insufficient documentation

## 2017-01-24 DIAGNOSIS — Z86010 Personal history of colonic polyps: Secondary | ICD-10-CM | POA: Insufficient documentation

## 2017-01-24 DIAGNOSIS — R194 Change in bowel habit: Secondary | ICD-10-CM | POA: Insufficient documentation

## 2017-01-24 DIAGNOSIS — Z1211 Encounter for screening for malignant neoplasm of colon: Secondary | ICD-10-CM | POA: Diagnosis present

## 2017-01-24 DIAGNOSIS — Z79899 Other long term (current) drug therapy: Secondary | ICD-10-CM | POA: Diagnosis not present

## 2017-01-24 DIAGNOSIS — R933 Abnormal findings on diagnostic imaging of other parts of digestive tract: Secondary | ICD-10-CM | POA: Diagnosis not present

## 2017-01-24 HISTORY — PX: FLEXIBLE SIGMOIDOSCOPY: SHX5431

## 2017-01-24 SURGERY — SIGMOIDOSCOPY, FLEXIBLE
Anesthesia: General

## 2017-01-24 MED ORDER — FENTANYL CITRATE (PF) 100 MCG/2ML IJ SOLN
INTRAMUSCULAR | Status: DC | PRN
  Administered 2017-01-24: 50 ug via INTRAVENOUS
  Administered 2017-01-24: 25 ug via INTRAVENOUS

## 2017-01-24 MED ORDER — MIDAZOLAM HCL 5 MG/5ML IJ SOLN
INTRAMUSCULAR | Status: AC
  Filled 2017-01-24: qty 5

## 2017-01-24 MED ORDER — MIDAZOLAM HCL 2 MG/2ML IJ SOLN
INTRAMUSCULAR | Status: DC | PRN
  Administered 2017-01-24 (×2): 1 mg via INTRAVENOUS

## 2017-01-24 MED ORDER — FENTANYL CITRATE (PF) 100 MCG/2ML IJ SOLN
INTRAMUSCULAR | Status: AC
  Filled 2017-01-24: qty 2

## 2017-01-24 MED ORDER — MIDAZOLAM HCL 2 MG/2ML IJ SOLN
INTRAMUSCULAR | Status: AC
  Filled 2017-01-24: qty 2

## 2017-01-24 NOTE — Interval H&P Note (Signed)
History and Physical Interval Note:  01/24/2017 3:17 PM  Carrie Serrano  has presented today for surgery, with the diagnosis of ABNORMAL COLONOSCOPY  The various methods of treatment have been discussed with the patient and family. After consideration of risks, benefits and other options for treatment, the patient has consented to  Procedure(s): FLEXIBLE SIGMOIDOSCOPY (N/A) as a surgical intervention .  The patient's history has been reviewed, patient examined, no change in status, stable for surgery.  I have reviewed the patient's chart and labs.  Questions were answered to the patient's satisfaction.     [place], [place]

## 2017-01-24 NOTE — H&P (Signed)
Outpatient short stay form Pre-procedure 01/24/2017 3:13 PM Myrl Lazarus K. Alice Reichert, M.D.  Primary Physician: [name], PA-C  Reason for visit:  Change in bowel habits, hx of anal papilla with mild squamous metplasia  History of present illness:  66 y/o wf presents for flexible sigmoidoscopy for the above hx. Patient has 3 loose stools daily, usually in the AM. Denies involuntary weight loss, rectal bleeding. Had a tubular adenoma in colon on last colonoscopy in Feb 2018.    Current Facility-Administered Medications:  .  fentaNYL (SUBLIMAZE) 100 MCG/2ML injection, , , ,  .  midazolam (VERSED) 2 MG/2ML injection, , , ,  .  midazolam (VERSED) 5 MG/5ML injection, , , ,   Prescriptions Prior to Admission  Medication Sig Dispense Refill Last Dose  . Multiple Vitamin (MULTIVITAMIN) tablet Take 1 tablet by mouth daily.   Past Week at Unknown time  . Turmeric 500 MG TABS Take 538 mg by mouth.   Past Week at Unknown time  . Vitamin D, Cholecalciferol, 400 units CAPS Take 800 Units by mouth.   Past Week at Unknown time  . denosumab (PROLIA) 60 MG/ML SOLN injection Inject 60 mg into the skin every 6 (six) months. Administer in upper arm, thigh, or abdomen     . valACYclovir (VALTREX) 500 MG tablet Take 500 mg by mouth 2 (two) times daily as needed. For outbreak   Taking     Allergies  Allergen Reactions  . Latex Itching     Past Medical History:  Diagnosis Date  . HSV-2 (herpes simplex virus 2) infection   . Osteopenia   . PAT (paroxysmal atrial tachycardia) ([place])   . SVT (supraventricular tachycardia) ([facility])     Review of systems:   As in HPI. Otherwise focused 8 organ system is negative for significant pertinent positives.   Physical Exam  Gen: NAD. Appears comfortable.  HEENT: [place]/AT. PERRLA. Normal external ear exam.  Chest: CTA, no wheezes.  CV: RR nl S1, S2. No gallops.  Abd: soft, nt, nd. BS+  Ext: no edema. Pulses 2+  Neuro: Alert and oriented. Judgement appears  normal. Nonfocal.    Planned procedures: Proceed with flexible sigmoidoscopy. The patient understands the nature of the planned procedure, indications, risks, alternatives and potential complications including but not limited to bleeding, infection, perforation, damage to internal organs and possible oversedation/side effects from anesthesia. The patient agrees and gives consent to proceed.  Please refer to procedure notes for findings, recommendations and patient disposition/instructions.    Delvin Hedeen K. Alice Reichert, M.D. Gastroenterology 01/24/2017  3:13 PM

## 2017-01-24 NOTE — Op Note (Signed)
[facility] Gastroenterology Patient Name: Carrie Serrano Procedure Date: 01/24/2017 2:39 PM MRN: [id number] Account #: [id number] Date of Birth: 1951/01/16 Admit Type: Outpatient Age: 66 Room: [facility] ENDO ROOM 1 Gender: Female Note Status: Finalized Procedure:            Flexible Sigmoidoscopy Indications:          High risk colon cancer surveillance: Personal history                        of non-advanced adenoma, Hx of anal papilla with mild                        dysplasia - 06/2016 colonoscopy. Providers:            Benay Pike. Alice Reichert MD, MD Referring MD:         [name] (Referring MD) Medicines:            Midazolam 2 mg IV, Fentanyl 75 micrograms IV Complications:        No immediate complications. Procedure:            Pre-Anesthesia Assessment:                       - ASA Grade Assessment: II - A patient with mild                        systemic disease.                       - After reviewing the risks and benefits, the patient                        was deemed in satisfactory condition to undergo the                        procedure.                       - The anesthesia plan was to use moderate                        sedation/analgesia (conscious sedation).                       - Immediately prior to administration of medications,                        the patient was re-assessed for adequacy to receive                        sedatives.                       - Sedation was administered by an endoscopy nurse. The                        sedation level attained was minimal.                       - The heart rate, respiratory rate, oxygen saturations,                        blood  pressure, adequacy of pulmonary ventilation, and                        response to care were monitored throughout the                        procedure.                       - The physical status of the patient was re-assessed                        after the procedure.                      After obtaining informed consent, the scope was passed                        under direct vision. The Colonoscope was introduced                        through the anus and advanced to the the sigmoid colon.                        The flexible sigmoidoscopy was accomplished without                        difficulty. The patient tolerated the procedure fairly                        well. The quality of the bowel preparation was                        excellent. Findings:      The perianal and digital rectal examinations were normal. Pertinent       negatives include normal sphincter tone. Impression:           - No specimens collected. Recommendation:       - Discharge patient to home (with spouse).                       - Patient has a contact number available for                        emergencies. The signs and symptoms of potential                        delayed complications were discussed with the patient.                        Return to normal activities tomorrow. Written discharge                        instructions were provided to the patient.                       - Advance diet as tolerated. Procedure Code(s):    --- Professional ---                       [phone], Sigmoidoscopy, flexible; diagnostic, including  collection of specimen(s) by brushing or washing, when                        performed (separate procedure) CPT copyright 2016 American Medical Association. All rights reserved. The codes documented in this report are preliminary and upon coder review may  be revised to meet current compliance requirements. Efrain Sella MD, MD 01/24/2017 3:00:01 PM This report has been signed electronically. Number of Addenda: 0 Note Initiated On: 01/24/2017 2:39 PM Total Procedure Duration: 0 hours 5 minutes 15 seconds       [facility]

## 2017-01-25 ENCOUNTER — Encounter: Payer: Self-pay | Admitting: Internal Medicine

## 2017-01-29 DIAGNOSIS — J069 Acute upper respiratory infection, unspecified: Secondary | ICD-10-CM | POA: Diagnosis not present

## 2017-02-01 DIAGNOSIS — M810 Age-related osteoporosis without current pathological fracture: Secondary | ICD-10-CM | POA: Diagnosis not present

## 2017-03-19 ENCOUNTER — Other Ambulatory Visit: Payer: Self-pay | Admitting: Physician Assistant

## 2017-03-19 DIAGNOSIS — Z1231 Encounter for screening mammogram for malignant neoplasm of breast: Secondary | ICD-10-CM

## 2017-04-17 ENCOUNTER — Ambulatory Visit
Admission: RE | Admit: 2017-04-17 | Discharge: 2017-04-17 | Disposition: A | Discharge status: Home / Self Care | Payer: 59 | Source: Ambulatory Visit | Attending: Physician Assistant | Admitting: Physician Assistant

## 2017-04-17 DIAGNOSIS — Z1231 Encounter for screening mammogram for malignant neoplasm of breast: Secondary | ICD-10-CM | POA: Diagnosis not present

## 2017-04-17 IMAGING — MG 2D DIGITAL SCREENING BILATERAL MAMMOGRAM WITH CAD AND ADJUNCT TO
9 of 12 series · 9 of 28 positions shown · non-contrast
Comparison: Previous exam(s).

CLINICAL DATA: Screening.

EXAM:
2D DIGITAL SCREENING BILATERAL MAMMOGRAM WITH CAD AND ADJUNCT TOMO

[L MLO]
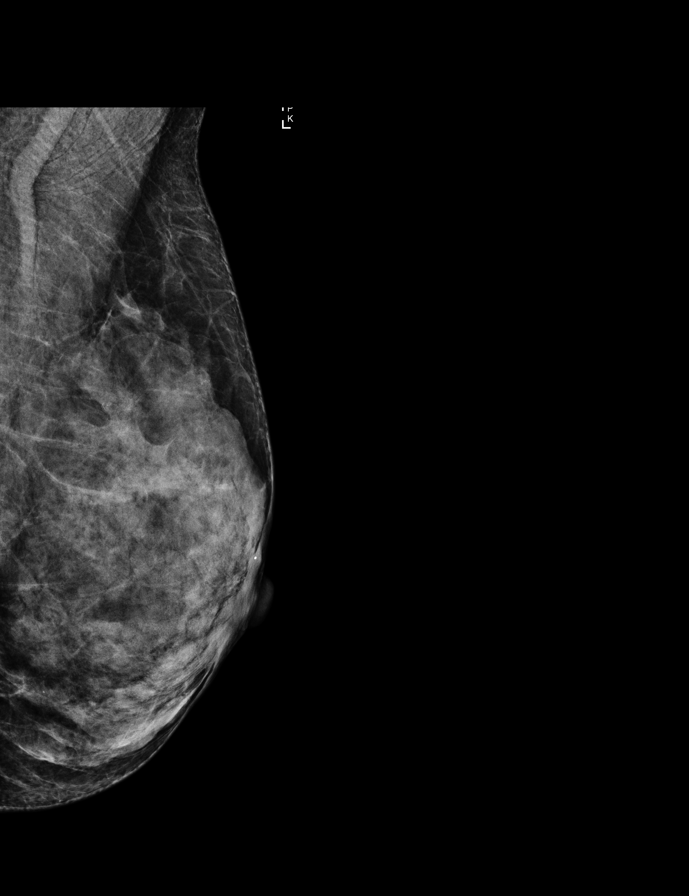

[R MLO]
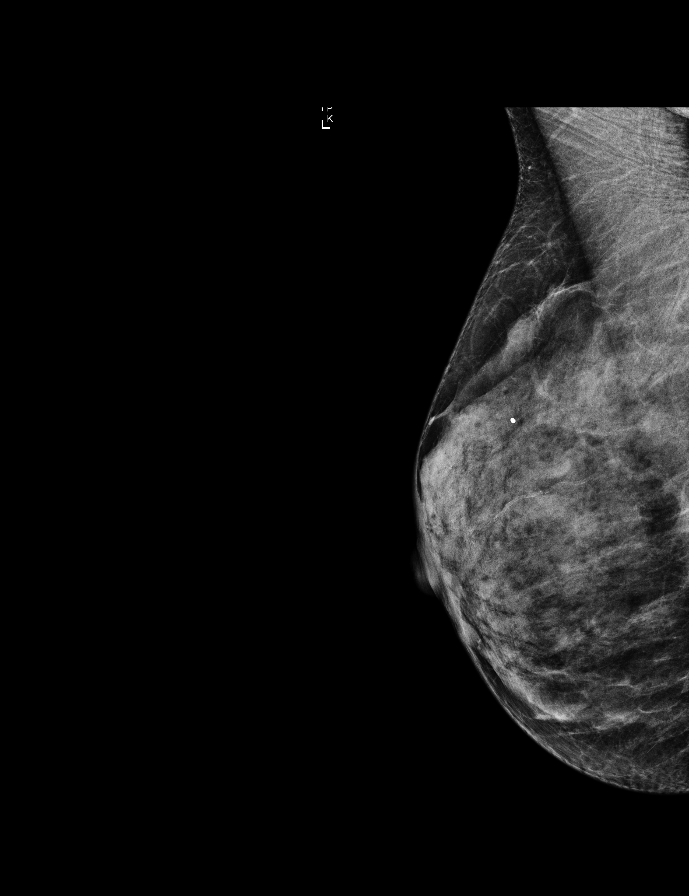

[L CC synth-2D]
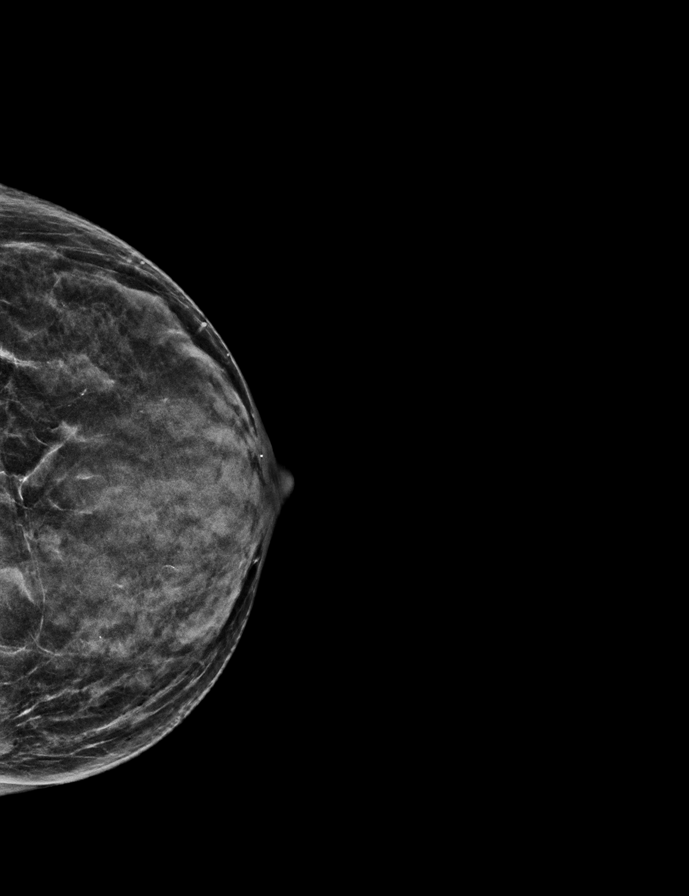

[R MLO synth-2D]
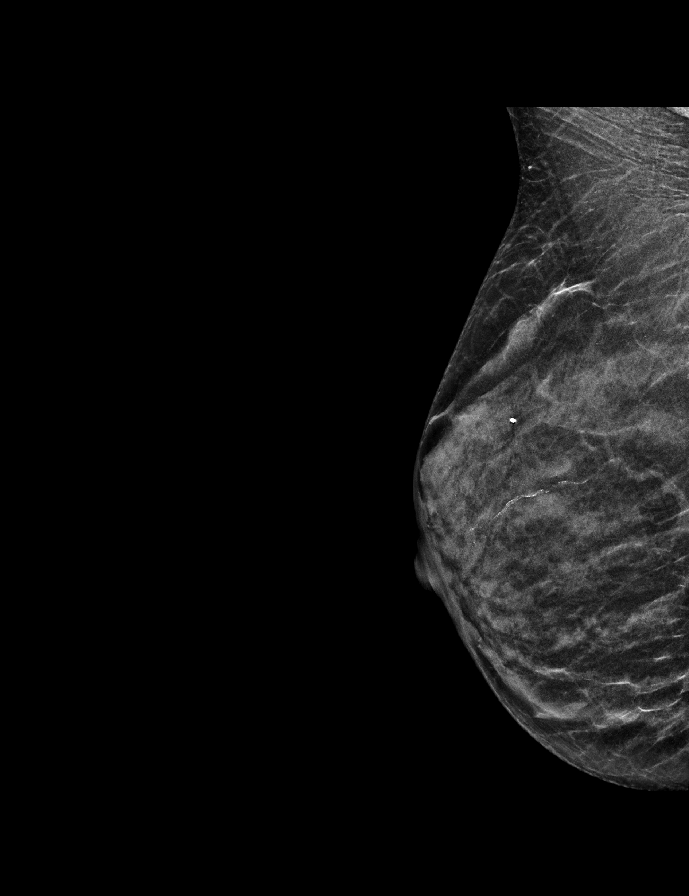

[L MLO synth-2D]
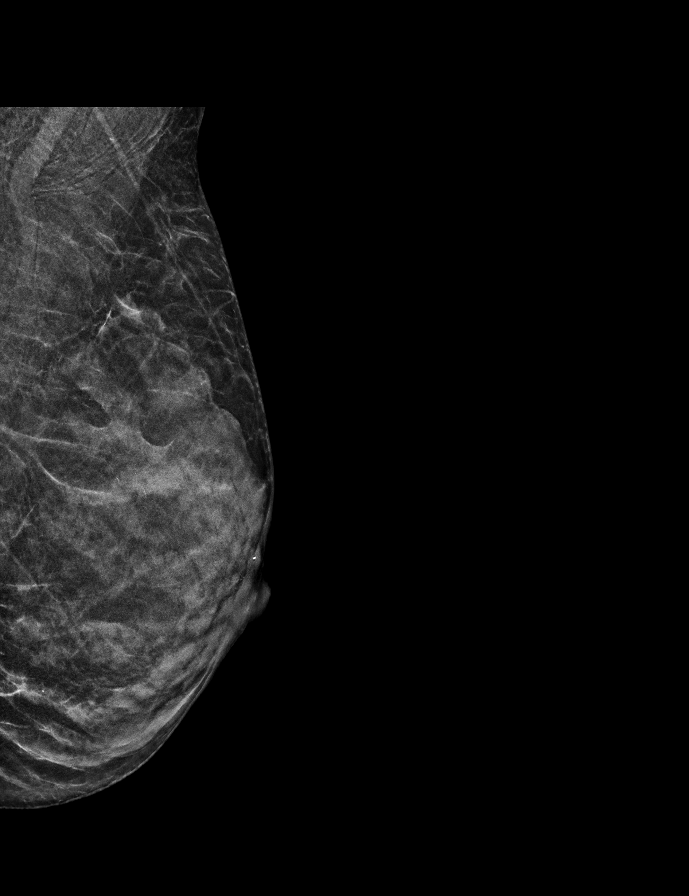

[L CC]
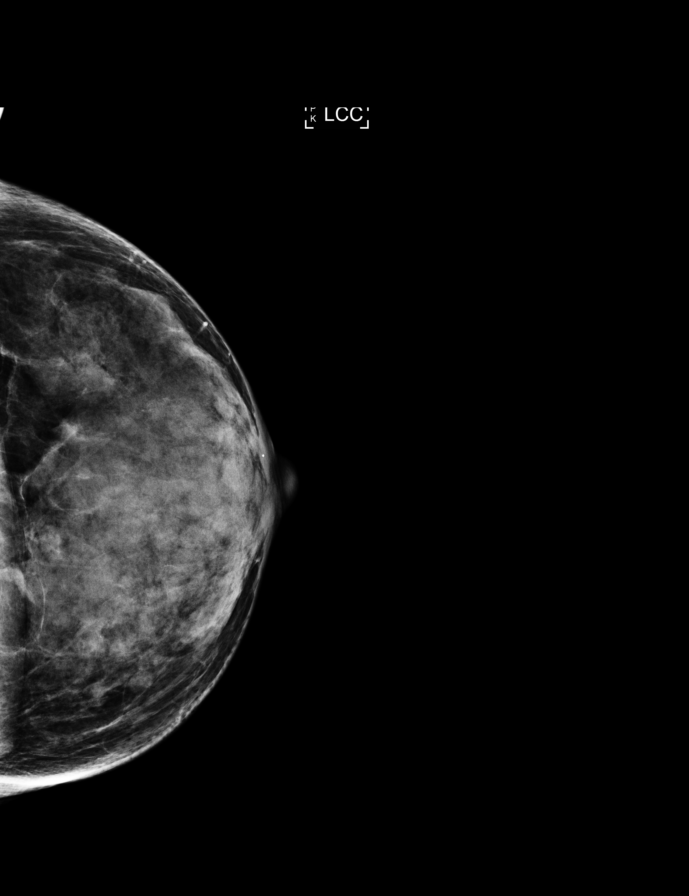

[R CC]
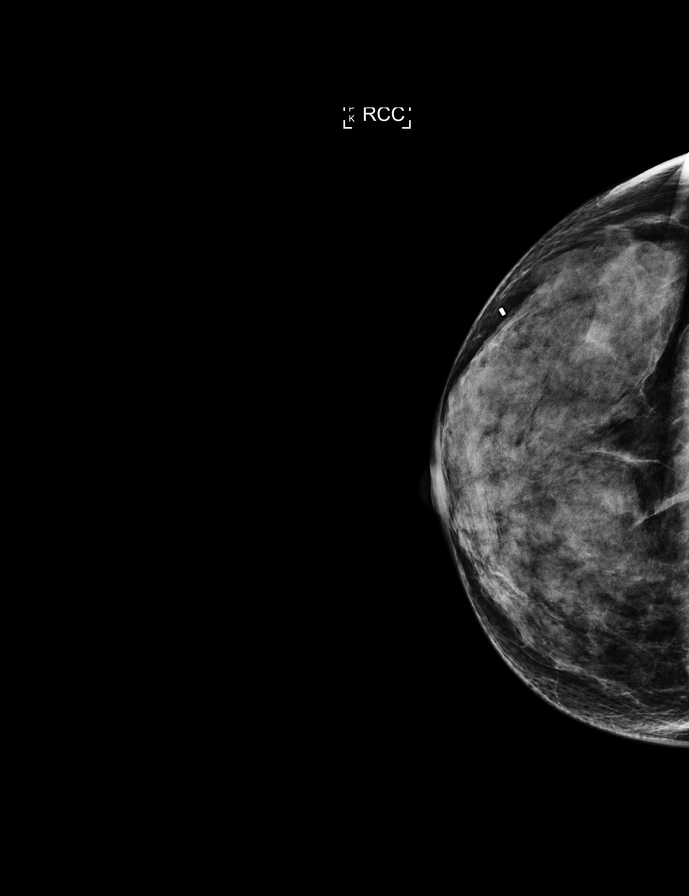

[R CC synth-2D]
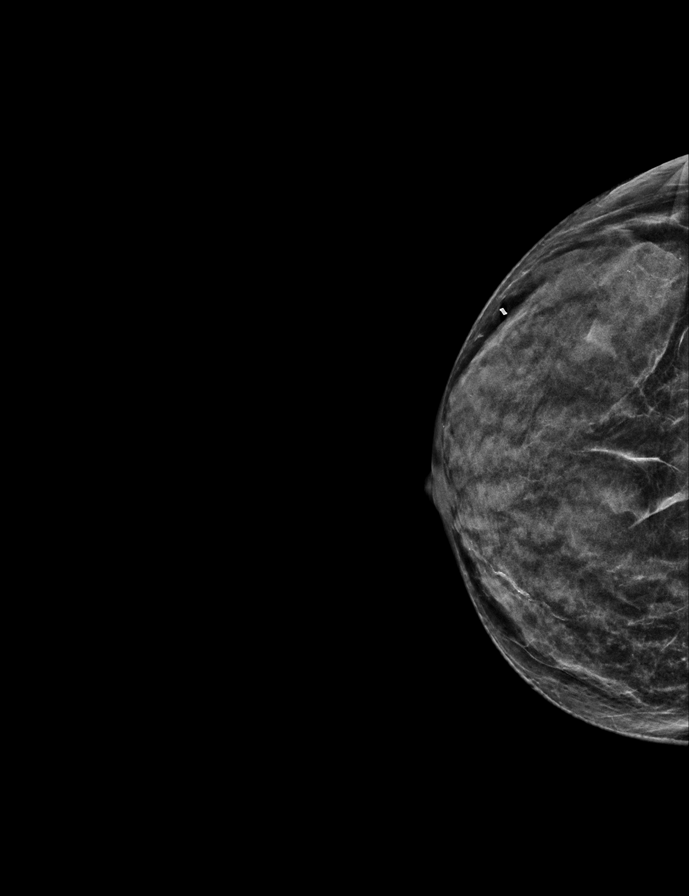

[R MLO tomo · tomo slice 19/36.0]
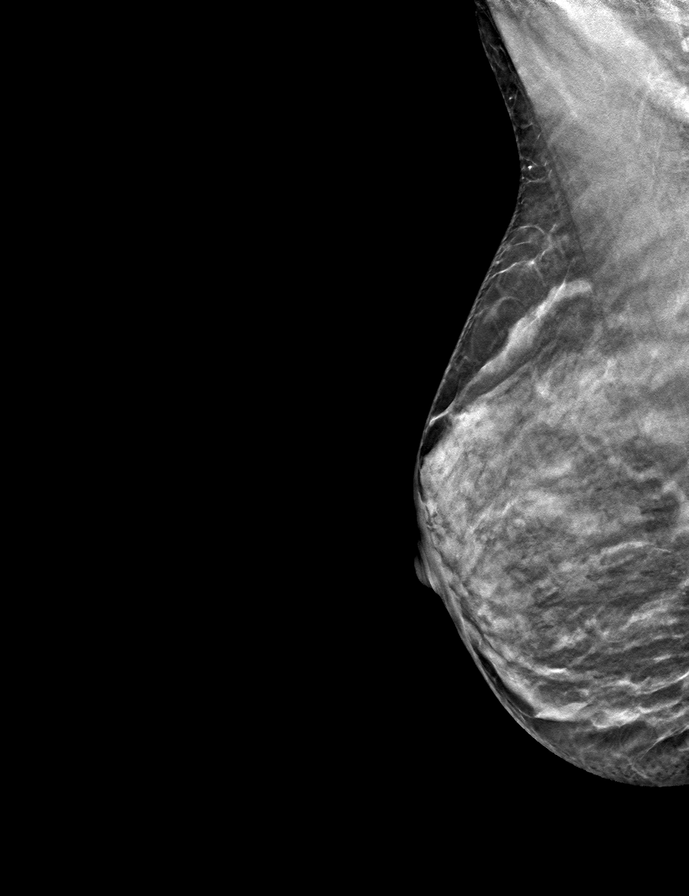

[9 of 28 positions shown; findings below may reference images not displayed]

ACR Breast Density Category d: The breast tissue is extremely dense,
which lowers the sensitivity of mammography.
FINDINGS: There are no findings suspicious for malignancy. Images were
processed with CAD.
IMPRESSION: No mammographic evidence of malignancy. A result letter of this
screening mammogram will be mailed directly to the patient.

RECOMMENDATION:
Screening mammogram in one year. (Code:[5L])

BI-RADS CATEGORY  1: Negative.

## 2017-05-03 DIAGNOSIS — K629 Disease of anus and rectum, unspecified: Secondary | ICD-10-CM | POA: Diagnosis not present

## 2017-05-03 DIAGNOSIS — R140 Abdominal distension (gaseous): Secondary | ICD-10-CM | POA: Diagnosis not present

## 2017-05-03 DIAGNOSIS — K649 Unspecified hemorrhoids: Secondary | ICD-10-CM | POA: Diagnosis not present

## 2017-05-04 DIAGNOSIS — R140 Abdominal distension (gaseous): Secondary | ICD-10-CM | POA: Insufficient documentation

## 2017-05-07 DIAGNOSIS — M810 Age-related osteoporosis without current pathological fracture: Secondary | ICD-10-CM | POA: Diagnosis not present

## 2017-05-07 DIAGNOSIS — Z1159 Encounter for screening for other viral diseases: Secondary | ICD-10-CM | POA: Diagnosis not present

## 2017-05-07 DIAGNOSIS — Z13220 Encounter for screening for lipoid disorders: Secondary | ICD-10-CM | POA: Diagnosis not present

## 2017-05-07 DIAGNOSIS — Z0000 Encounter for general adult medical examination without abnormal findings: Secondary | ICD-10-CM | POA: Diagnosis not present

## 2017-06-25 DIAGNOSIS — K136 Irritative hyperplasia of oral mucosa: Secondary | ICD-10-CM | POA: Diagnosis not present

## 2017-07-24 DIAGNOSIS — D225 Melanocytic nevi of trunk: Secondary | ICD-10-CM | POA: Diagnosis not present

## 2017-07-24 DIAGNOSIS — L814 Other melanin hyperpigmentation: Secondary | ICD-10-CM | POA: Diagnosis not present

## 2017-07-24 DIAGNOSIS — L821 Other seborrheic keratosis: Secondary | ICD-10-CM | POA: Diagnosis not present

## 2017-07-24 DIAGNOSIS — L570 Actinic keratosis: Secondary | ICD-10-CM | POA: Diagnosis not present

## 2017-08-03 DIAGNOSIS — M810 Age-related osteoporosis without current pathological fracture: Secondary | ICD-10-CM | POA: Diagnosis not present

## 2017-08-22 ENCOUNTER — Encounter: Payer: Self-pay | Admitting: Cardiology

## 2017-09-06 ENCOUNTER — Encounter: Payer: Self-pay | Admitting: Cardiology

## 2017-09-06 ENCOUNTER — Ambulatory Visit: Payer: 59 | Admitting: Cardiology

## 2017-09-06 VITALS — BP 100/68 | HR 66 | Ht 66.0 in | Wt 124.0 lb

## 2017-09-06 DIAGNOSIS — I471 Supraventricular tachycardia: Secondary | ICD-10-CM | POA: Diagnosis not present

## 2017-09-06 NOTE — Progress Notes (Signed)
[facility]. [address], [address], [place]  [address] Phone: [phone] Fax:  [phone]  Date:  09/06/2017   ID:  Carrie Serrano, DOB [birth year], MRN [id number]  PCP:  [name], PA-C   History of Present Illness: Carrie Serrano is a 67 y.o. female with history of supraventricular tachycardia/paroxysmal atrial tachycardia status post ablation in 2014 by Dr. Lovena Le here for followup.  On 04/17/11 her EKG strip looked AVNRT and a heart rate of 160-170. On another visit she brought me another strip of her going into her arrhythmia. I do not see the onset of the arrhythmia but when compared to prior strip, her heart rate is actually in the 140s. I do not see a clear P-wave preceding the QRS complexes. She does terminate with a PVC. The remaining beats are in sinus rhythm.  EPS/ablation by Dr. Crissie Sickles in 2014, questionable success in lab but clinically she has improved dramatically.  She had very difficult anatomy.  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  She has been doing well. She is working hard with Dr. Buelah Manis oral Psychologist, sport and exercise.    09/06/2017-she is hopeful to retire in 11 months.  She is not having any further palpitations, no symptoms, no chest pain fevers chills nausea vomiting syncope.  Doing well.  LDL 95, triglycerides 43, HDL 99, creatinine 0.8, hemoglobin 14.3    Wt Readings from Last 3 Encounters:  09/06/17 124 lb (56.2 kg)  01/24/17 121 lb (54.9 kg)  10/03/16 126 lb (57.2 kg)     Past Medical History:  Diagnosis Date  . HSV-2 (herpes simplex virus 2) infection   . Osteopenia   . PAT (paroxysmal atrial tachycardia) ([place])   . SVT (supraventricular tachycardia) ([facility])     Past Surgical History:  Procedure Laterality Date  . ABDOMINAL HYSTERECTOMY    . ABLATION OF DYSRHYTHMIC FOCUS  06/13/2012   SVT    Dr  Lovena Le  . COLONOSCOPY  2012  . COLONOSCOPY WITH PROPOFOL N/A 06/26/2016   Procedure: COLONOSCOPY  WITH PROPOFOL;  Surgeon: Lollie Sails, MD;  Location: [facility] ENDOSCOPY;  Service: Endoscopy;  Laterality: N/A;  . ELECTROPHYSIOLOGY STUDY N/A 06/13/2012   Procedure: ELECTROPHYSIOLOGY STUDY;  Surgeon: Evans Lance, MD;  Location: [facility] CATH LAB;  Service: Cardiovascular;  Laterality: N/A;  . FLEXIBLE SIGMOIDOSCOPY N/A 01/24/2017   Procedure: FLEXIBLE SIGMOIDOSCOPY;  Surgeon: Toledo, Benay Pike, MD;  Location: [facility] ENDOSCOPY;  Service: Gastroenterology;  Laterality: N/A;  . SUPRAVENTRICULAR TACHYCARDIA ABLATION N/A 06/13/2012   Procedure: SUPRAVENTRICULAR TACHYCARDIA ABLATION;  Surgeon: Evans Lance, MD;  Location: [facility] CATH LAB;  Service: Cardiovascular;  Laterality: N/A;  . varicose vein injection in left lower leg      Current Outpatient Medications  Medication Sig Dispense Refill  . Calcium Carbonate-Vit D-Min (CALCIUM 1200) 1200-1000 MG-UNIT CHEW Chew 1 tablet by mouth daily.    Marland Kitchen denosumab (PROLIA) 60 MG/ML SOLN injection Inject 60 mg into the skin every 6 (six) months. Administer in upper arm, thigh, or abdomen    . Multiple Vitamin (MULTIVITAMIN) tablet Take 1 tablet by mouth daily.    . valACYclovir (VALTREX) 500 MG tablet Take 500 mg by mouth 2 (two) times daily as needed. For outbreak    . vitamin B-12 (CYANOCOBALAMIN) 1000 MCG tablet Take 1,000 mcg by mouth daily.    . Vitamin D, Cholecalciferol, 400 units CAPS Take 800 Units by mouth.     No current facility-administered medications  for this visit.     Allergies:    Allergies  Allergen Reactions  . Latex Itching    Social History:  The patient  reports that she quit smoking about 3 years ago. Her smoking use included cigarettes. She has a 12.50 pack-year smoking history. She has never used smokeless tobacco. She reports that she drinks about 4.8 - 6.0 oz of alcohol per week. She reports that she does not use drugs.   Family History  Problem Relation Age of Onset  . Stroke Mother   . Diabetes Mother   . Heart attack Father    . Hypertension Father   Father - CHF  ROS:  Please see the history of present illness.   Denies any syncope, palpitations, shortness of breath.   All other systems reviewed and negative.   PHYSICAL EXAM: VS:  BP 100/68   Pulse 66   Ht 5\' 6"  (1.676 m)   Wt 124 lb (56.2 kg)   BMI 20.01 kg/m  GEN: Thin well developed, in no acute distress  HEENT: normal  Neck: no JVD, carotid bruits, or masses Cardiac: brady reg; no murmurs, rubs, or gallops,no edema  Respiratory:  clear to auscultation bilaterally, normal work of breathing GI: soft, nontender, nondistended, + BS MS: no deformity or atrophy  Skin: warm and dry, no rash Neuro:  Alert and Oriented x 3, Strength and sensation are intact Psych: euthymic mood, full affect   EKG: 09/06/2017-sinus rhythm 66 vertical axis, incomplete right bundle branch block personally viewed-prior 09/18/14-sinus bradycardia, left axis deviation, right bundle branch block-previously 09/05/13-sinus bradycardia rate 57 with left anterior fascicular block, right bundle branch block, bifascicular block.     ASSESSMENT AND PLAN:  1. 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  Readings seem stable.  She has had no syncope. 2. SVT-prior EP ablation-off 12.5 Toprol at night.  No longer taking any beta-blocker.  Excellent.  Challenging for her to get her heart rate above 110. 3. Tobacco cessation-great job.5 years done.  Feels well. 4. One year follow up.   Signed, Candee Furbish, MD [facility]  09/06/2017 4:34 PM

## 2017-09-06 NOTE — Patient Instructions (Signed)
Medication Instructions:  The current medical regimen is effective;  continue present plan and medications.  Follow-Up: Follow up in 2 years with Dr. Skains.  You will receive a letter in the mail 2 months before you are due.  Please call us when you receive this letter to schedule your follow up appointment.  If you need a refill on your cardiac medications before your next appointment, please call your pharmacy.  Thank you for choosing [facility]!!     

## 2018-01-10 DIAGNOSIS — L718 Other rosacea: Secondary | ICD-10-CM | POA: Diagnosis not present

## 2018-01-10 DIAGNOSIS — L300 Nummular dermatitis: Secondary | ICD-10-CM | POA: Diagnosis not present

## 2018-01-10 DIAGNOSIS — L503 Dermatographic urticaria: Secondary | ICD-10-CM | POA: Diagnosis not present

## 2018-02-12 DIAGNOSIS — M79601 Pain in right arm: Secondary | ICD-10-CM | POA: Diagnosis not present

## 2018-03-18 ENCOUNTER — Other Ambulatory Visit: Payer: Self-pay | Admitting: Physician Assistant

## 2018-03-18 DIAGNOSIS — Z1231 Encounter for screening mammogram for malignant neoplasm of breast: Secondary | ICD-10-CM

## 2018-03-27 DIAGNOSIS — M25511 Pain in right shoulder: Secondary | ICD-10-CM | POA: Diagnosis not present

## 2018-03-27 DIAGNOSIS — M79601 Pain in right arm: Secondary | ICD-10-CM | POA: Diagnosis not present

## 2018-04-04 DIAGNOSIS — M79601 Pain in right arm: Secondary | ICD-10-CM | POA: Diagnosis not present

## 2018-04-04 DIAGNOSIS — M25511 Pain in right shoulder: Secondary | ICD-10-CM | POA: Diagnosis not present

## 2018-04-16 DIAGNOSIS — M25511 Pain in right shoulder: Secondary | ICD-10-CM | POA: Diagnosis not present

## 2018-04-16 DIAGNOSIS — M79601 Pain in right arm: Secondary | ICD-10-CM | POA: Diagnosis not present

## 2018-04-25 ENCOUNTER — Ambulatory Visit: Payer: 59

## 2018-04-30 ENCOUNTER — Ambulatory Visit
Admission: RE | Admit: 2018-04-30 | Discharge: 2018-04-30 | Disposition: A | Discharge status: Home / Self Care | Payer: 59 | Source: Ambulatory Visit | Attending: Physician Assistant | Admitting: Physician Assistant

## 2018-04-30 DIAGNOSIS — Z1231 Encounter for screening mammogram for malignant neoplasm of breast: Secondary | ICD-10-CM | POA: Diagnosis not present

## 2018-04-30 IMAGING — MG DIGITAL SCREENING BILATERAL MAMMOGRAM WITH TOMO AND CAD
8 series · 9 of 24 positions shown · non-contrast
Comparison: Previous exam(s).

CLINICAL DATA: Screening.

EXAM:
DIGITAL SCREENING BILATERAL MAMMOGRAM WITH TOMO AND CAD

[L CC synth-2D]
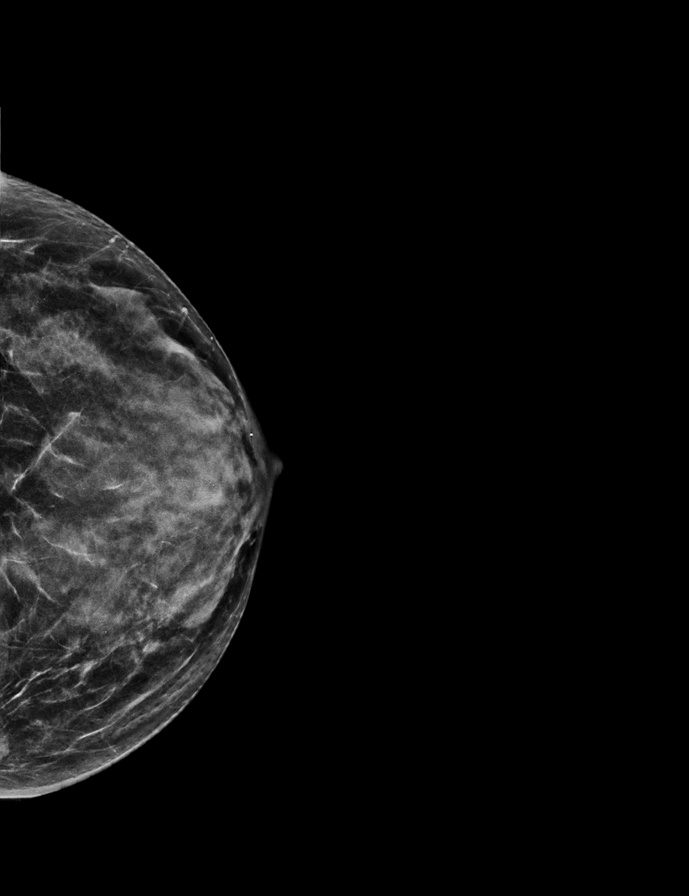

[R CC synth-2D]
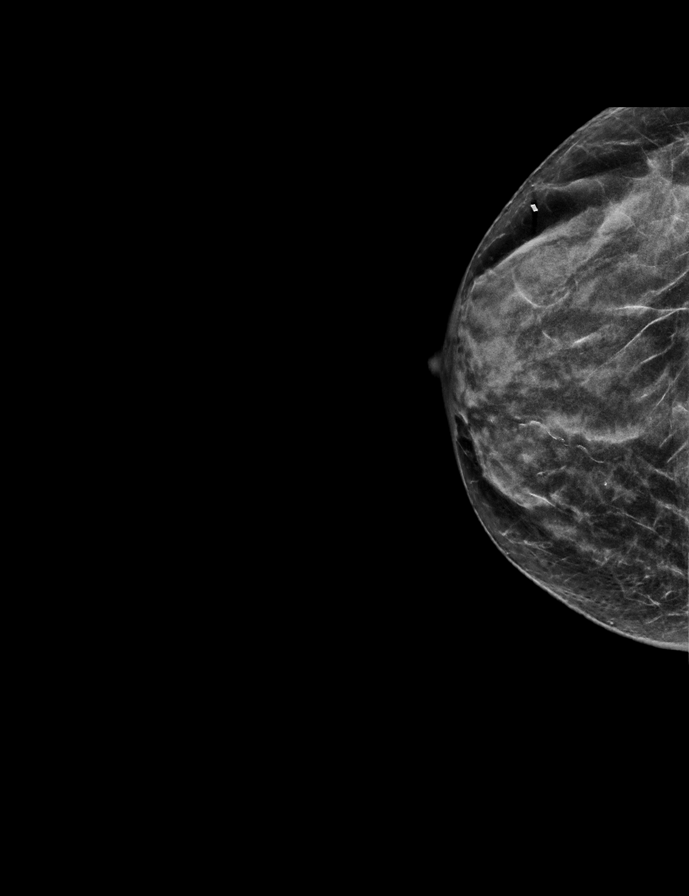

[R MLO synth-2D]
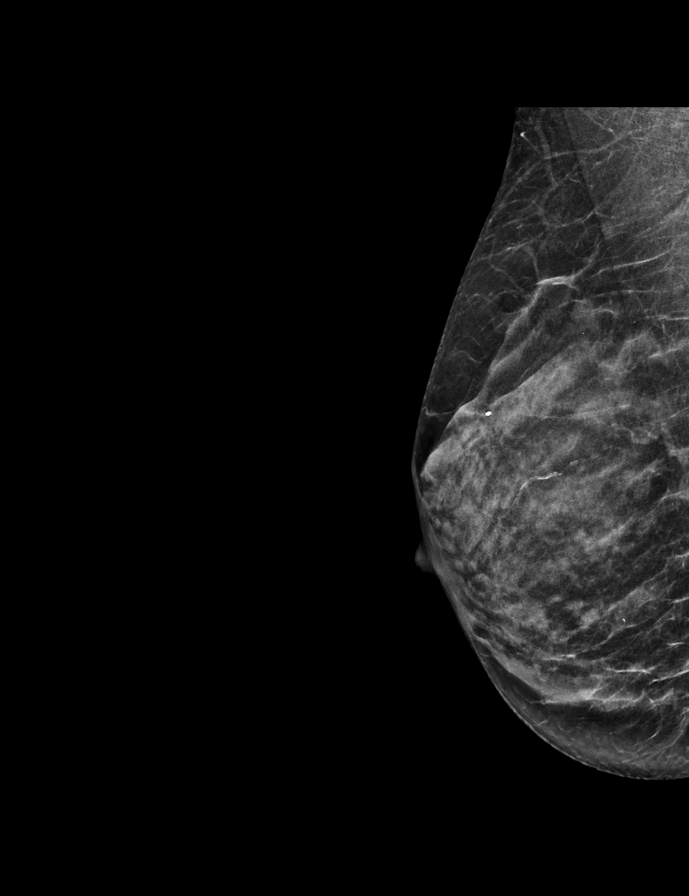

[L MLO synth-2D]
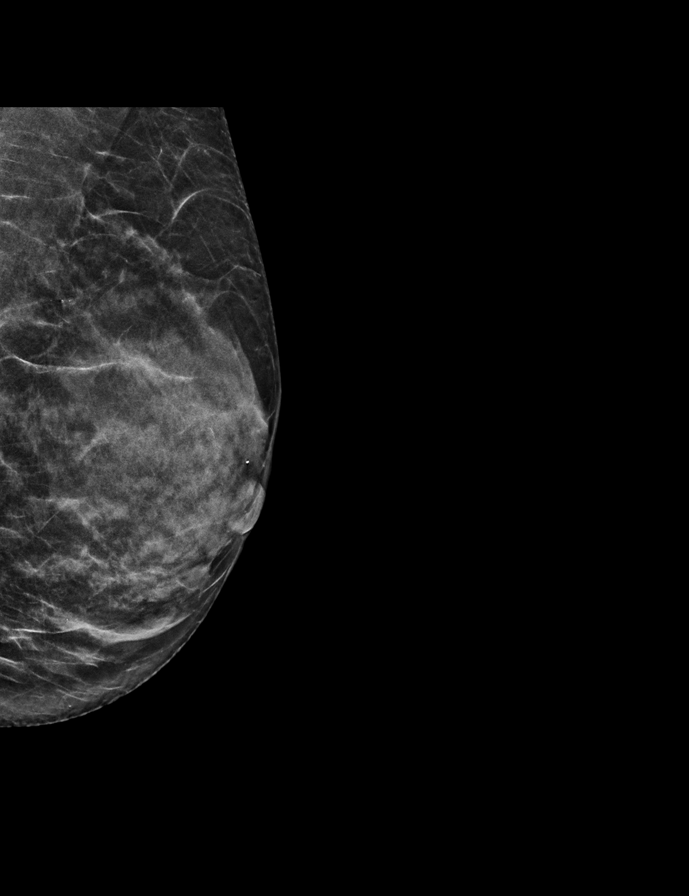

[L MLO tomo · 2 of 45 frames shown]
[frame 15/45]
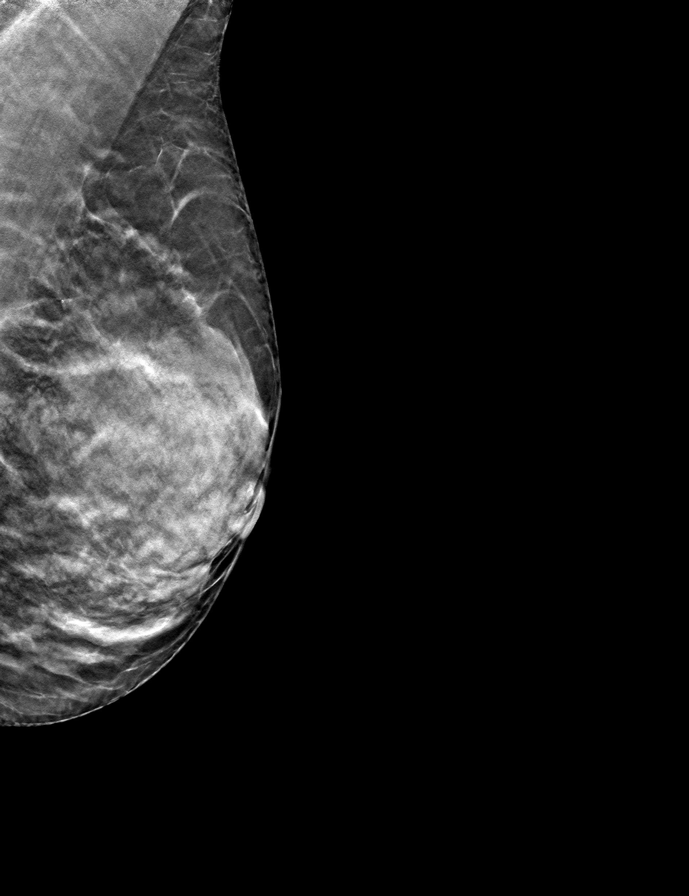
[frame 23/45]
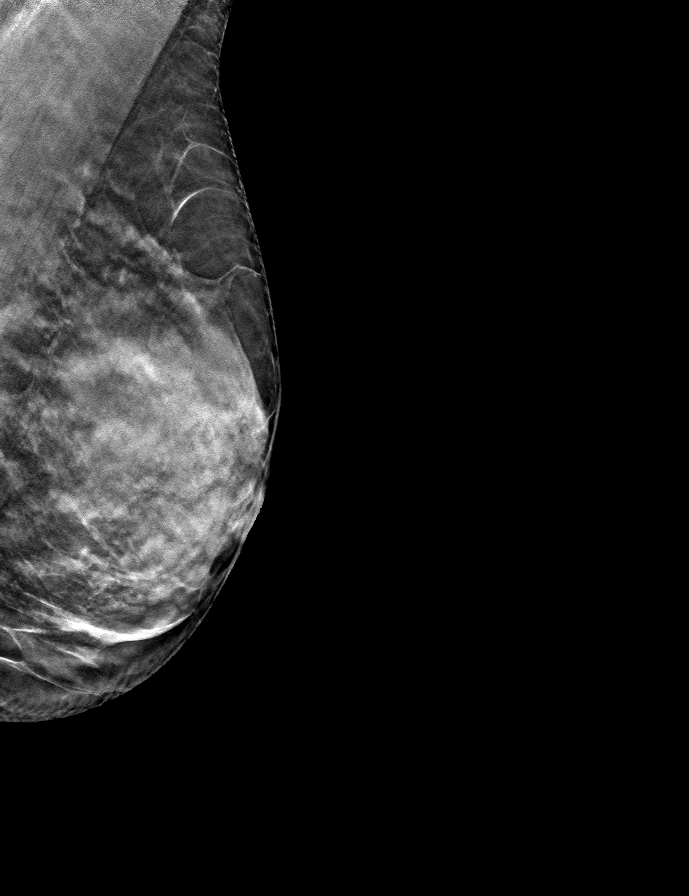

[R MLO tomo · tomo slice 22/43.0]
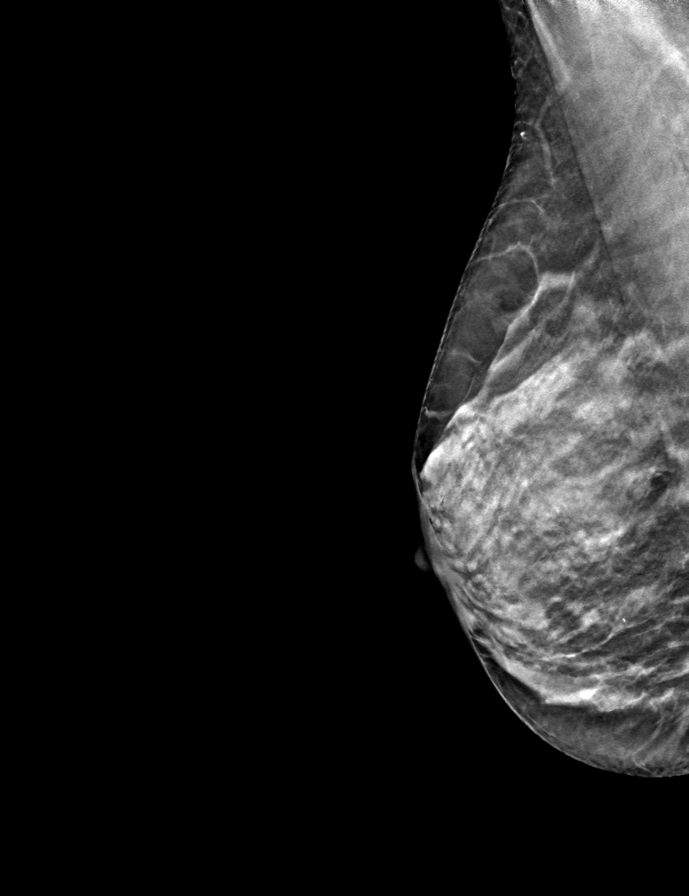

[R CC tomo · tomo slice 25/50.0]
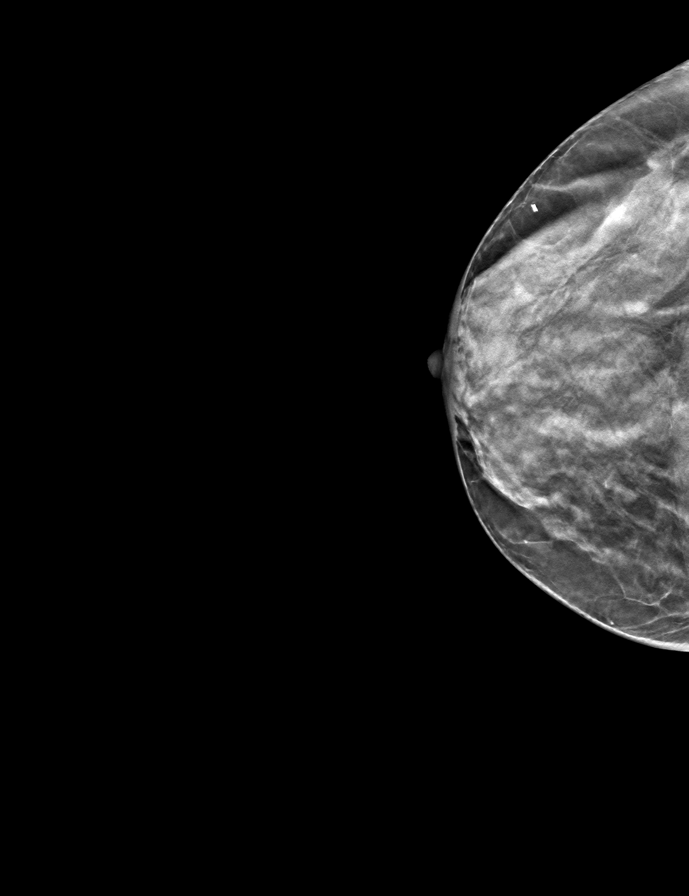

[L CC tomo · tomo slice 24/47.0]
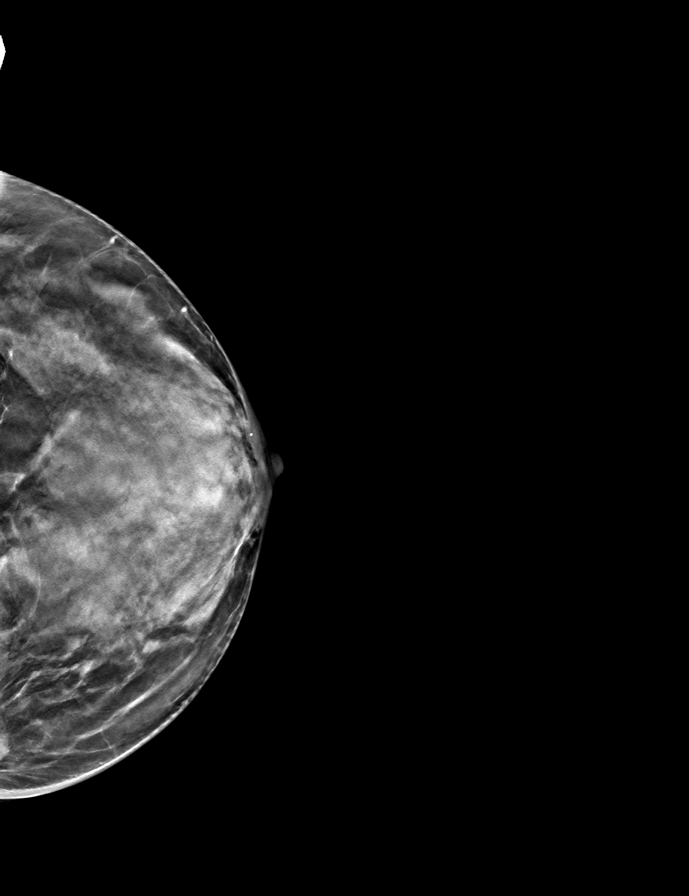

[9 of 24 positions shown; findings below may reference images not displayed]

ACR Breast Density Category d: The breast tissue is extremely dense,
which lowers the sensitivity of mammography
FINDINGS: There are no findings suspicious for malignancy. Images were
processed with CAD.
IMPRESSION: No mammographic evidence of malignancy. A result letter of this
screening mammogram will be mailed directly to the patient.

RECOMMENDATION:
Screening mammogram in one year. (Code:[5I])

BI-RADS CATEGORY  1: Negative.

## 2018-05-08 DIAGNOSIS — Z13220 Encounter for screening for lipoid disorders: Secondary | ICD-10-CM | POA: Diagnosis not present

## 2018-05-08 DIAGNOSIS — Z0000 Encounter for general adult medical examination without abnormal findings: Secondary | ICD-10-CM | POA: Diagnosis not present

## 2018-05-08 DIAGNOSIS — Z23 Encounter for immunization: Secondary | ICD-10-CM | POA: Diagnosis not present

## 2018-06-04 DIAGNOSIS — M810 Age-related osteoporosis without current pathological fracture: Secondary | ICD-10-CM | POA: Diagnosis not present

## 2018-07-08 DIAGNOSIS — M25561 Pain in right knee: Secondary | ICD-10-CM | POA: Diagnosis not present

## 2018-07-08 DIAGNOSIS — M79601 Pain in right arm: Secondary | ICD-10-CM | POA: Diagnosis not present

## 2018-07-08 DIAGNOSIS — M25511 Pain in right shoulder: Secondary | ICD-10-CM | POA: Diagnosis not present

## 2018-11-19 DIAGNOSIS — H6123 Impacted cerumen, bilateral: Secondary | ICD-10-CM | POA: Insufficient documentation

## 2019-04-01 ENCOUNTER — Other Ambulatory Visit: Payer: Self-pay | Admitting: Physician Assistant

## 2019-04-01 DIAGNOSIS — Z1231 Encounter for screening mammogram for malignant neoplasm of breast: Secondary | ICD-10-CM

## 2019-05-02 DIAGNOSIS — C50919 Malignant neoplasm of unspecified site of unspecified female breast: Secondary | ICD-10-CM

## 2019-05-02 HISTORY — DX: Malignant neoplasm of unspecified site of unspecified female breast: C50.919

## 2019-05-22 ENCOUNTER — Ambulatory Visit
Admission: RE | Admit: 2019-05-22 | Discharge: 2019-05-22 | Disposition: A | Discharge status: Home / Self Care | Payer: Medicare Other | Source: Ambulatory Visit | Attending: Physician Assistant | Admitting: Physician Assistant

## 2019-05-22 ENCOUNTER — Other Ambulatory Visit: Payer: Self-pay

## 2019-05-22 DIAGNOSIS — Z1231 Encounter for screening mammogram for malignant neoplasm of breast: Secondary | ICD-10-CM

## 2019-05-22 IMAGING — MG DIGITAL SCREENING BILAT W/ TOMO W/ CAD
8 series · 9 of 24 positions shown · non-contrast
Comparison: Previous exam(s).

CLINICAL DATA: Screening.

EXAM:
DIGITAL SCREENING BILATERAL MAMMOGRAM WITH TOMO AND CAD

[L CC synth-2D]
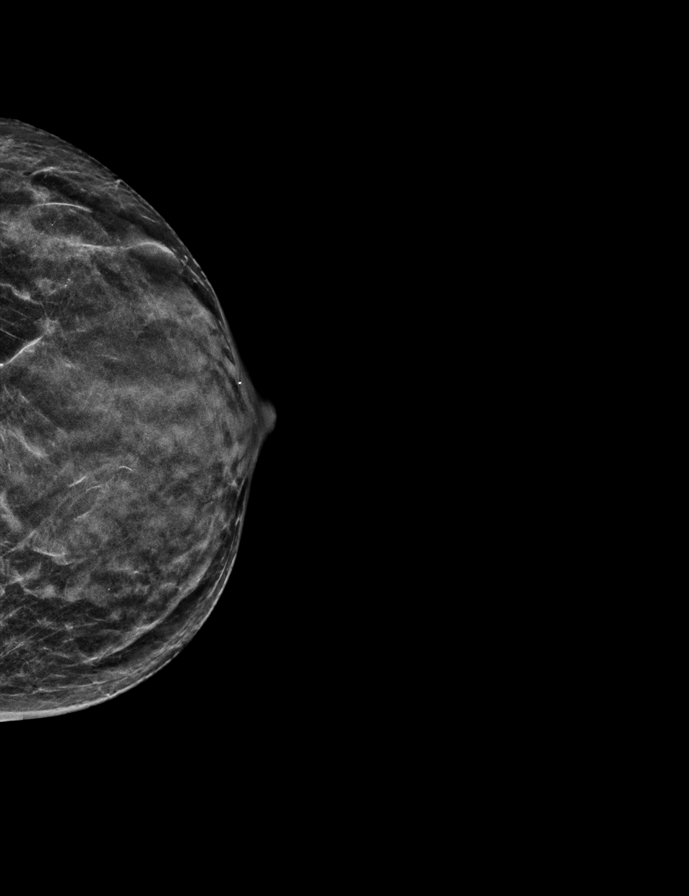

[R MLO synth-2D]
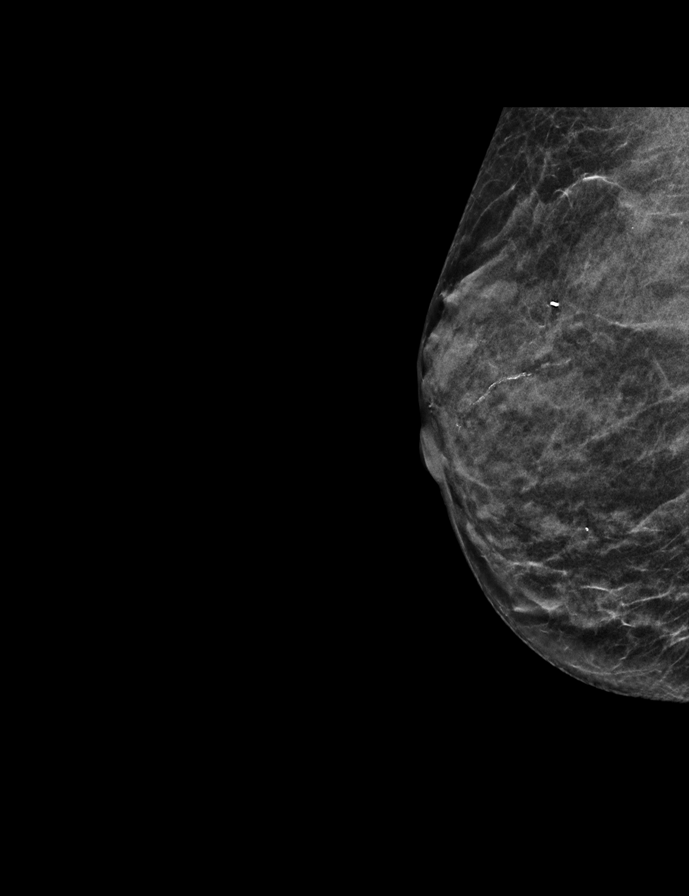

[L MLO synth-2D]
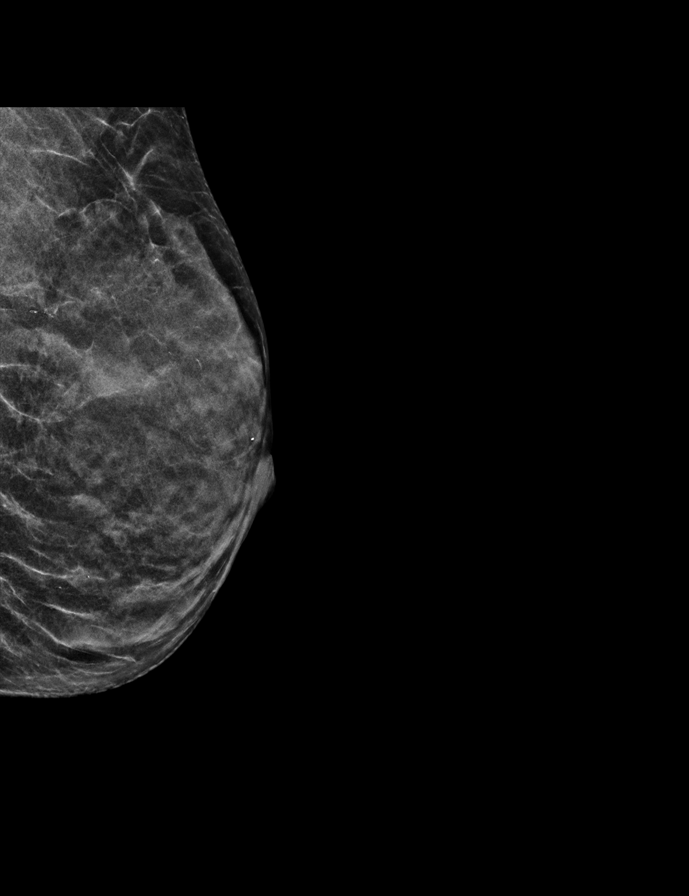

[R CC synth-2D]
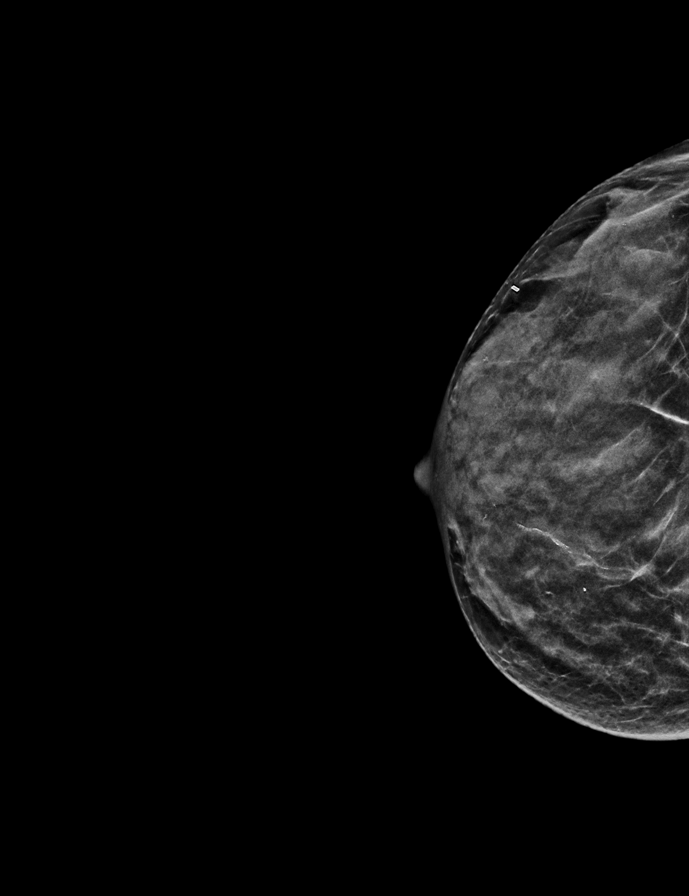

[R MLO tomo · 2 of 36 frames shown]
[frame 12/36]
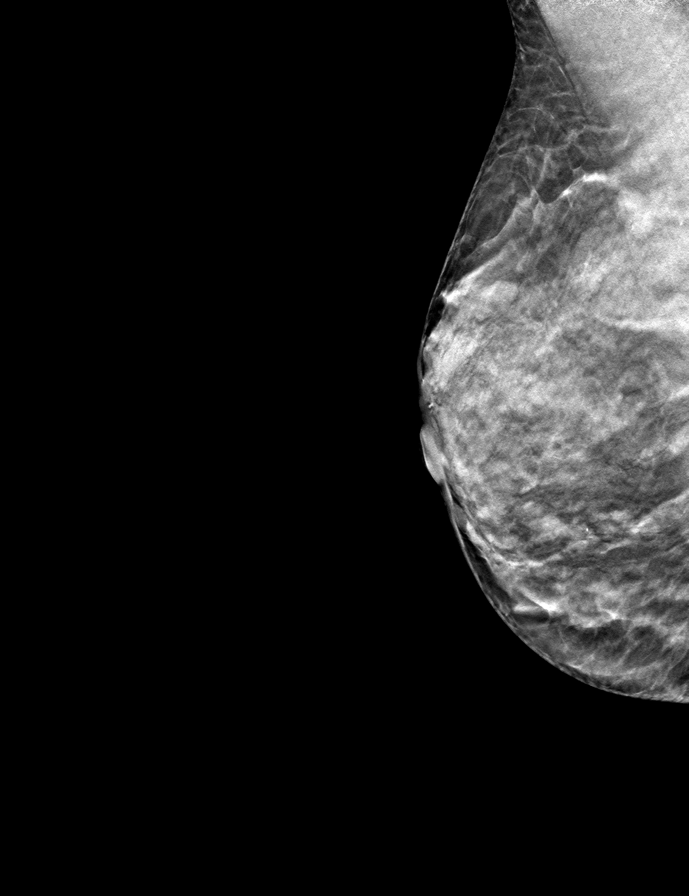
[frame 19/36]
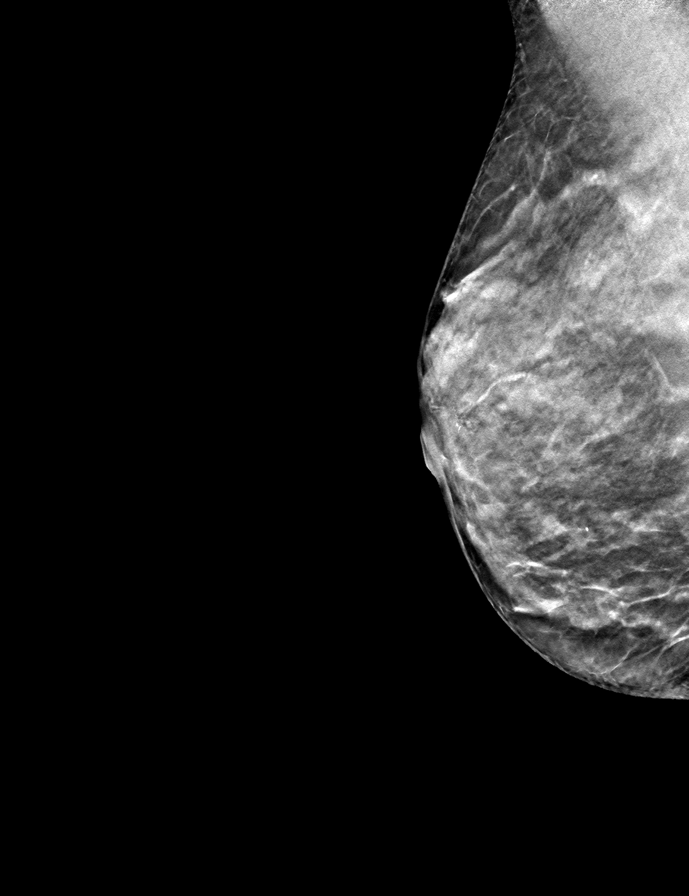

[L MLO tomo · tomo slice 20/39.0]
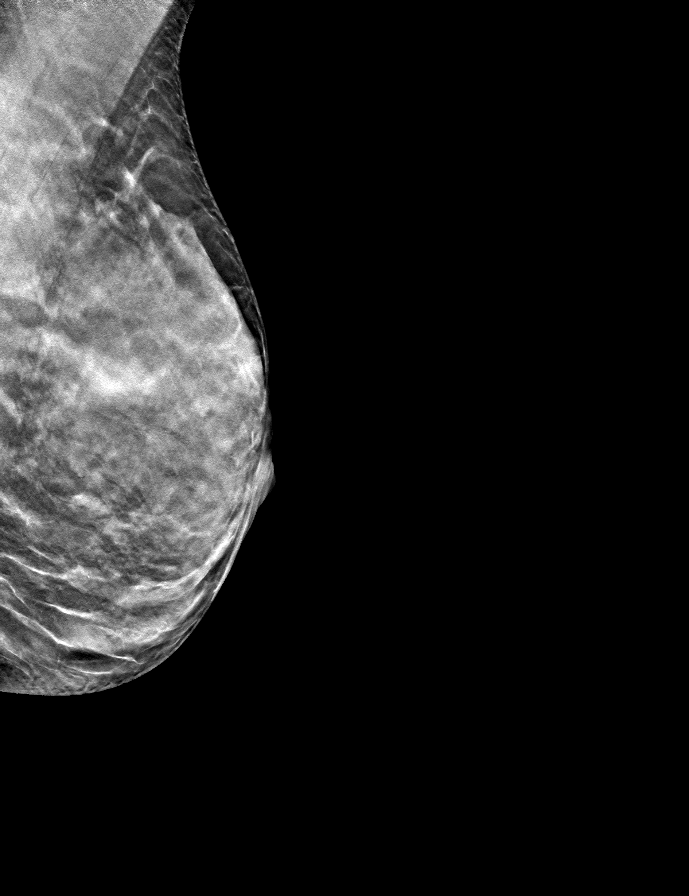

[L CC tomo · tomo slice 21/40.0]
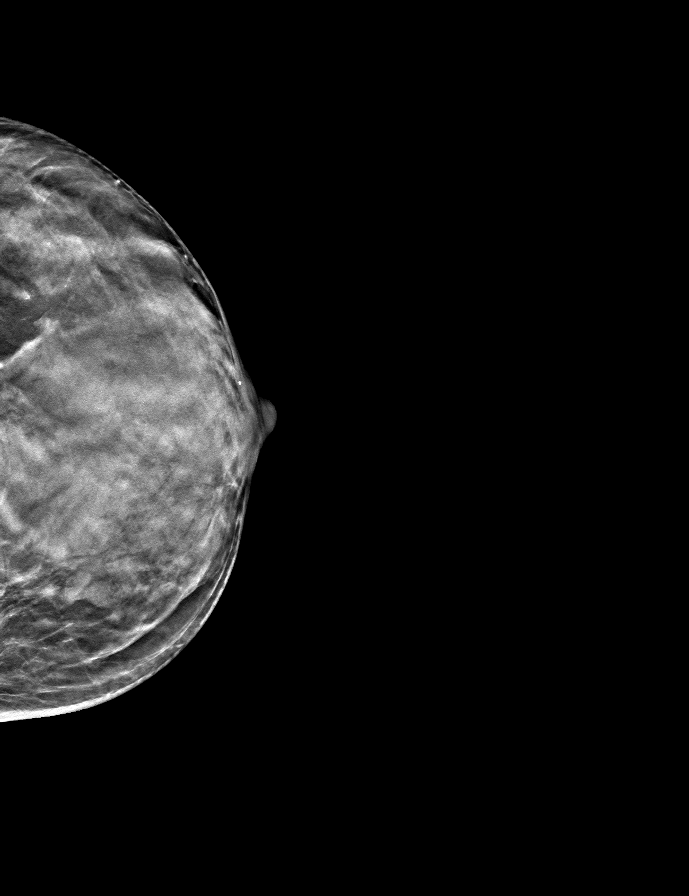

[R CC tomo · tomo slice 19/38.0]
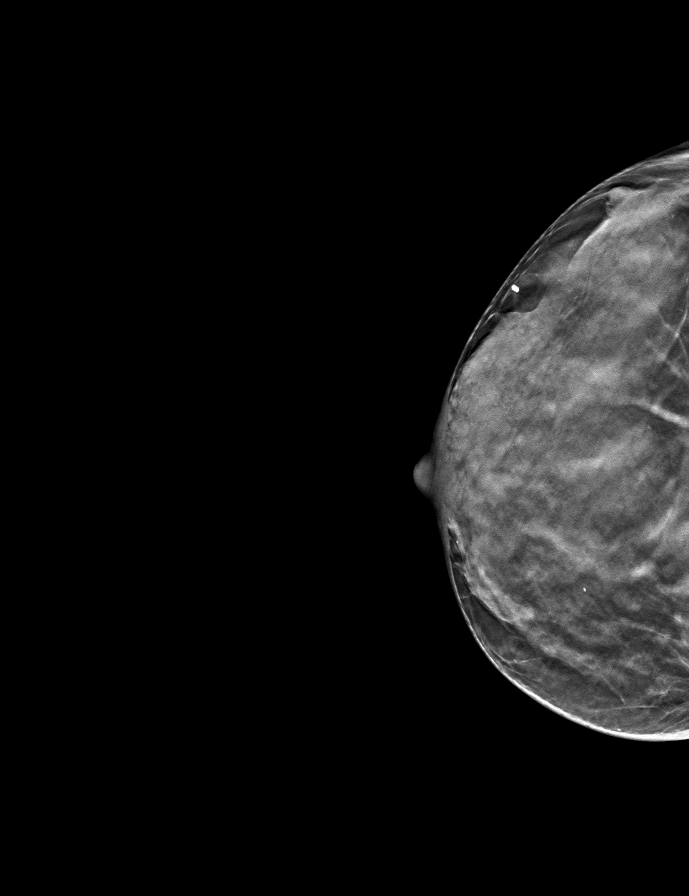

[9 of 24 positions shown; findings below may reference images not displayed]

ACR Breast Density Category d: The breast tissue is extremely dense,
which lowers the sensitivity of mammography.
FINDINGS: In the right breast, a possible mass warrants further evaluation. In
the left breast, no findings suspicious for malignancy. Images were
processed with CAD.
IMPRESSION: Further evaluation is suggested for possible mass in the right
breast.

RECOMMENDATION:
Diagnostic mammogram and possibly ultrasound of the right breast.
(Code:[AR])

The patient will be contacted regarding the findings, and additional
imaging will be scheduled.

BI-RADS CATEGORY  0: Incomplete. Need additional imaging evaluation
and/or prior mammograms for comparison.

## 2019-05-26 ENCOUNTER — Other Ambulatory Visit: Payer: Self-pay | Admitting: Physician Assistant

## 2019-05-26 DIAGNOSIS — R928 Other abnormal and inconclusive findings on diagnostic imaging of breast: Secondary | ICD-10-CM

## 2019-06-03 ENCOUNTER — Ambulatory Visit
Admission: RE | Admit: 2019-06-03 | Discharge: 2019-06-03 | Disposition: A | Discharge status: Home / Self Care | Payer: Medicare Other | Source: Ambulatory Visit | Attending: Physician Assistant | Admitting: Physician Assistant

## 2019-06-03 ENCOUNTER — Other Ambulatory Visit: Payer: Self-pay | Admitting: Physician Assistant

## 2019-06-03 ENCOUNTER — Other Ambulatory Visit: Payer: Self-pay

## 2019-06-03 DIAGNOSIS — R928 Other abnormal and inconclusive findings on diagnostic imaging of breast: Secondary | ICD-10-CM

## 2019-06-03 DIAGNOSIS — N6310 Unspecified lump in the right breast, unspecified quadrant: Secondary | ICD-10-CM

## 2019-06-03 IMAGING — US US BREAST*R* LIMITED INC AXILLA
1 series · 10 of 10 positions shown · non-contrast
Comparison: Previous exam(s).

CLINICAL DATA: Screening recall for a possible right breast mass.

EXAM:
DIGITAL DIAGNOSTIC RIGHT MAMMOGRAM WITH CAD AND TOMO
ULTRASOUND RIGHT BREAST

[Series 1: us breast*right* limited inc axilla · 0.04mm/px · 10 of 10 slices shown]
[im 1/10]
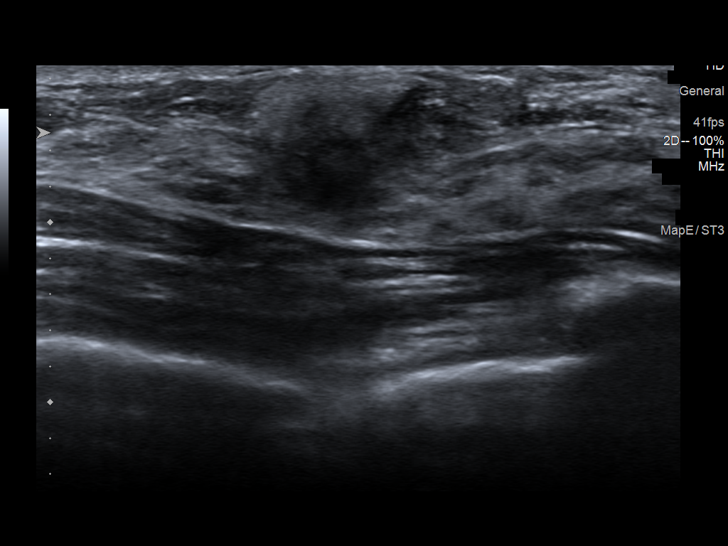
[im 2/10]
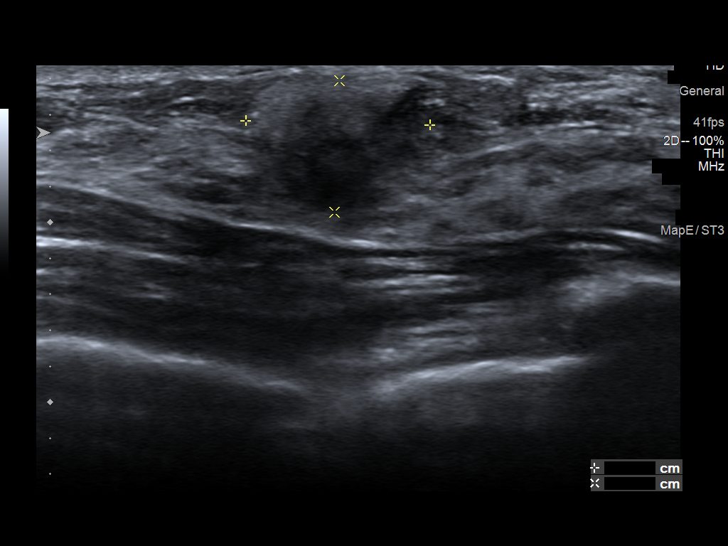
[im 3/10]
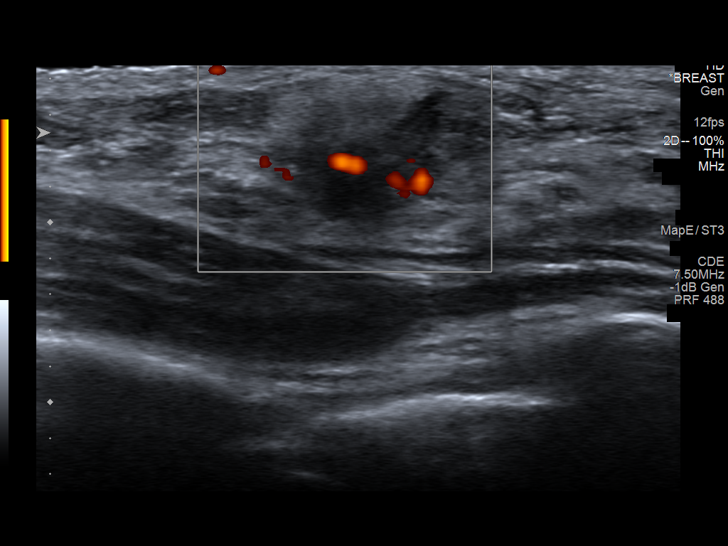
[im 4/10]
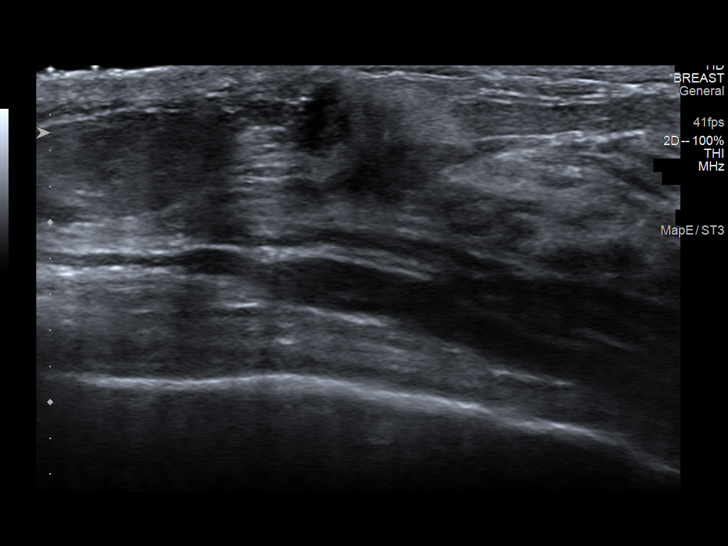
[im 5/10]
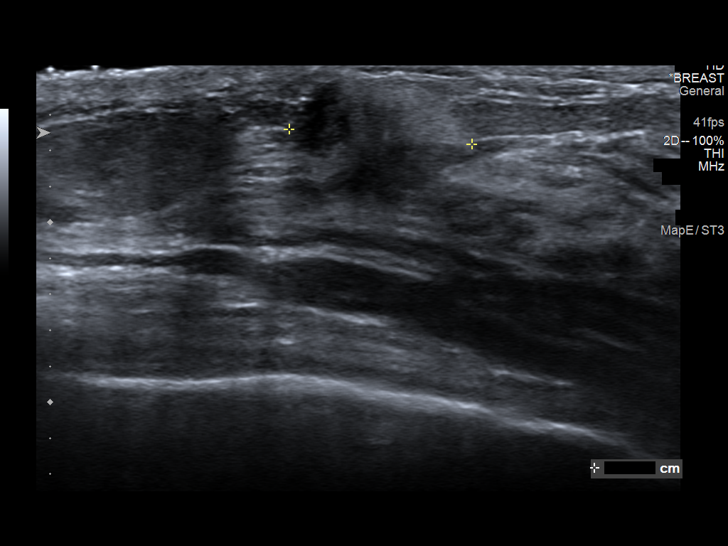
[im 6/10]
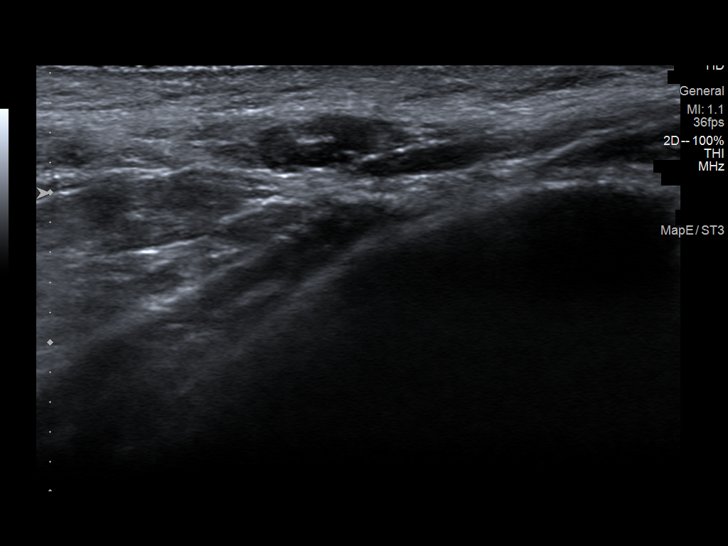
[im 7/10]
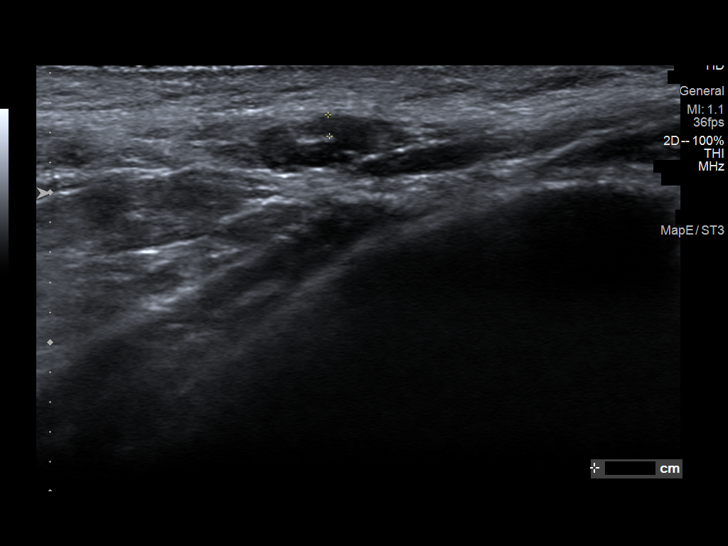
[im 8/10]
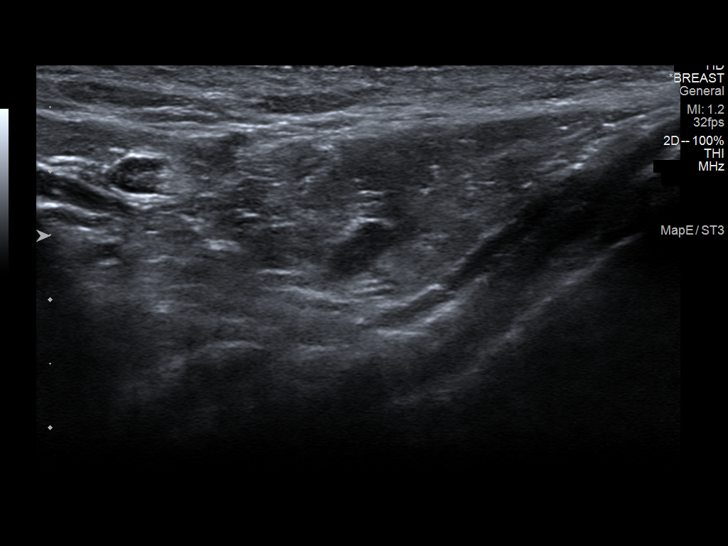
[im 9/10]
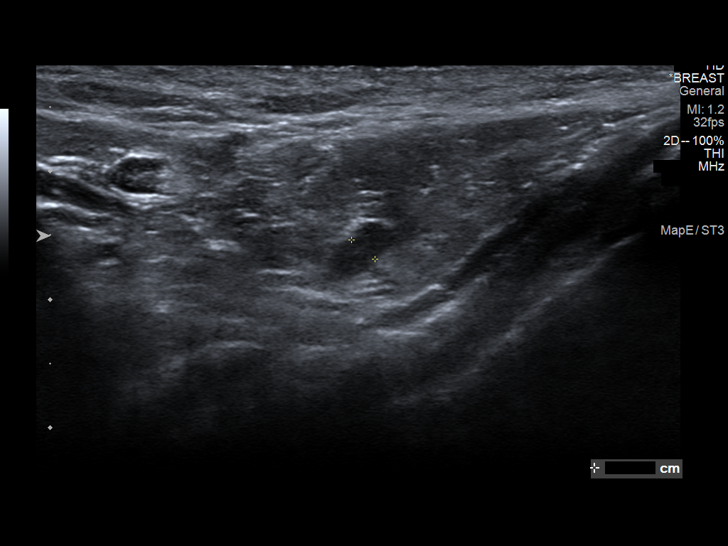
[im 10/10]
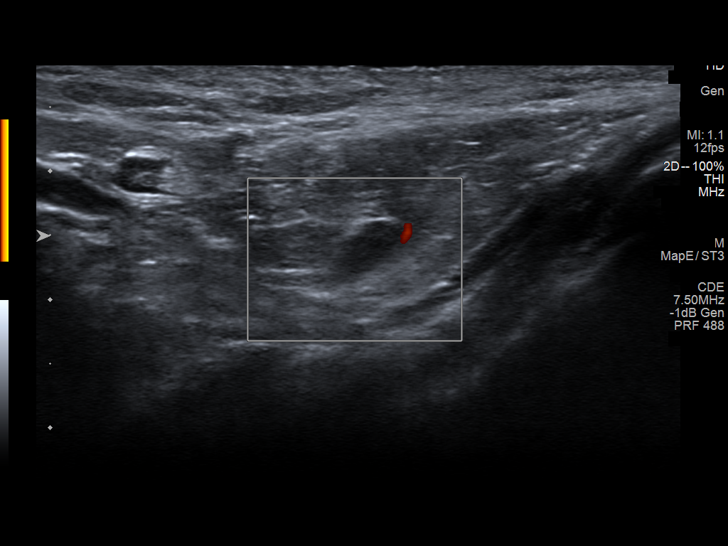

[10 of 10 positions shown; findings below may reference images not displayed]

ACR Breast Density Category d: The breast tissue is extremely dense,
which lowers the sensitivity of mammography.
FINDINGS: Spot compression tomosynthesis images through the upper outer
posterior right breast demonstrates a possible obscured mass.

Mammographic images were processed with CAD.

On physical exam, there is a firm small palpable lump in the
upper-outer quadrant of the right breast.

Ultrasound targeted to the right breast at 10 30, 5 cm from the
nipple demonstrates an ill-defined irregular mass with indistinct
margins measuring 1.0 x 0.7 x 1.0 cm. Normal lymph nodes are seen in
the right axilla.
IMPRESSION: 1.  There is a suspicious mass in the right breast at 10 o'clock.

2.  No evidence of right axillary lymphadenopathy.

RECOMMENDATION:
Ultrasound guided biopsy is recommended for the right breast mass.
This has been scheduled for [DATE] at [DATE] p.m.

I have discussed the findings and recommendations with the patient.
If applicable, a reminder letter will be sent to the patient
regarding the next appointment.

BI-RADS CATEGORY  5: Highly suggestive of malignancy.

## 2019-06-03 IMAGING — MG MM DIGITAL DIAGNOSTIC UNILAT*R* W/ TOMO W/ CAD
4 series · 4 of 12 positions shown · non-contrast
Comparison: Previous exam(s).

CLINICAL DATA: Screening recall for a possible right breast mass.

EXAM:
DIGITAL DIAGNOSTIC RIGHT MAMMOGRAM WITH CAD AND TOMO
ULTRASOUND RIGHT BREAST

[R CC synth-2D]
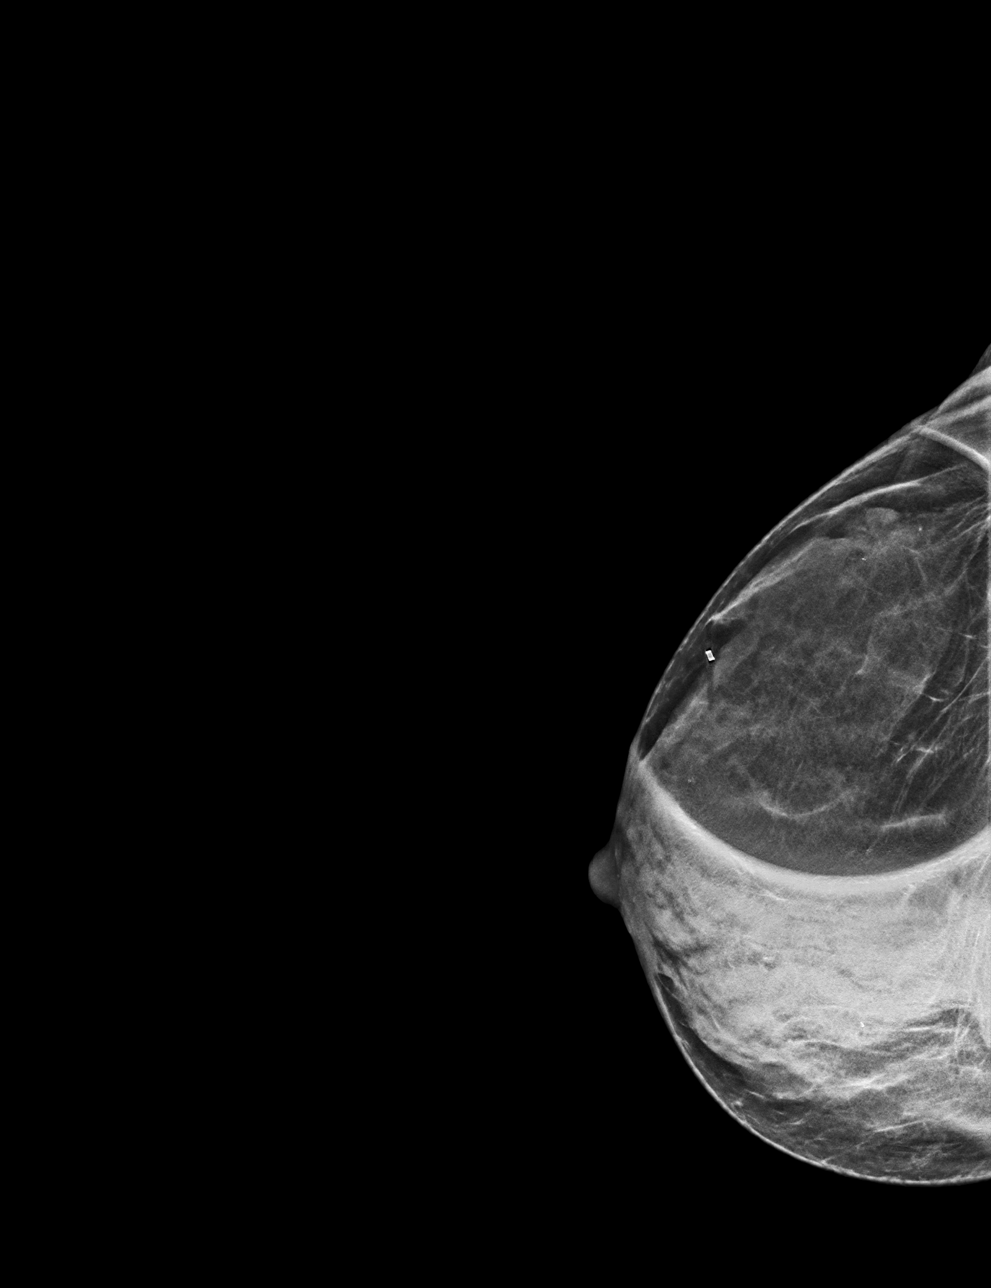

[R MLO synth-2D]
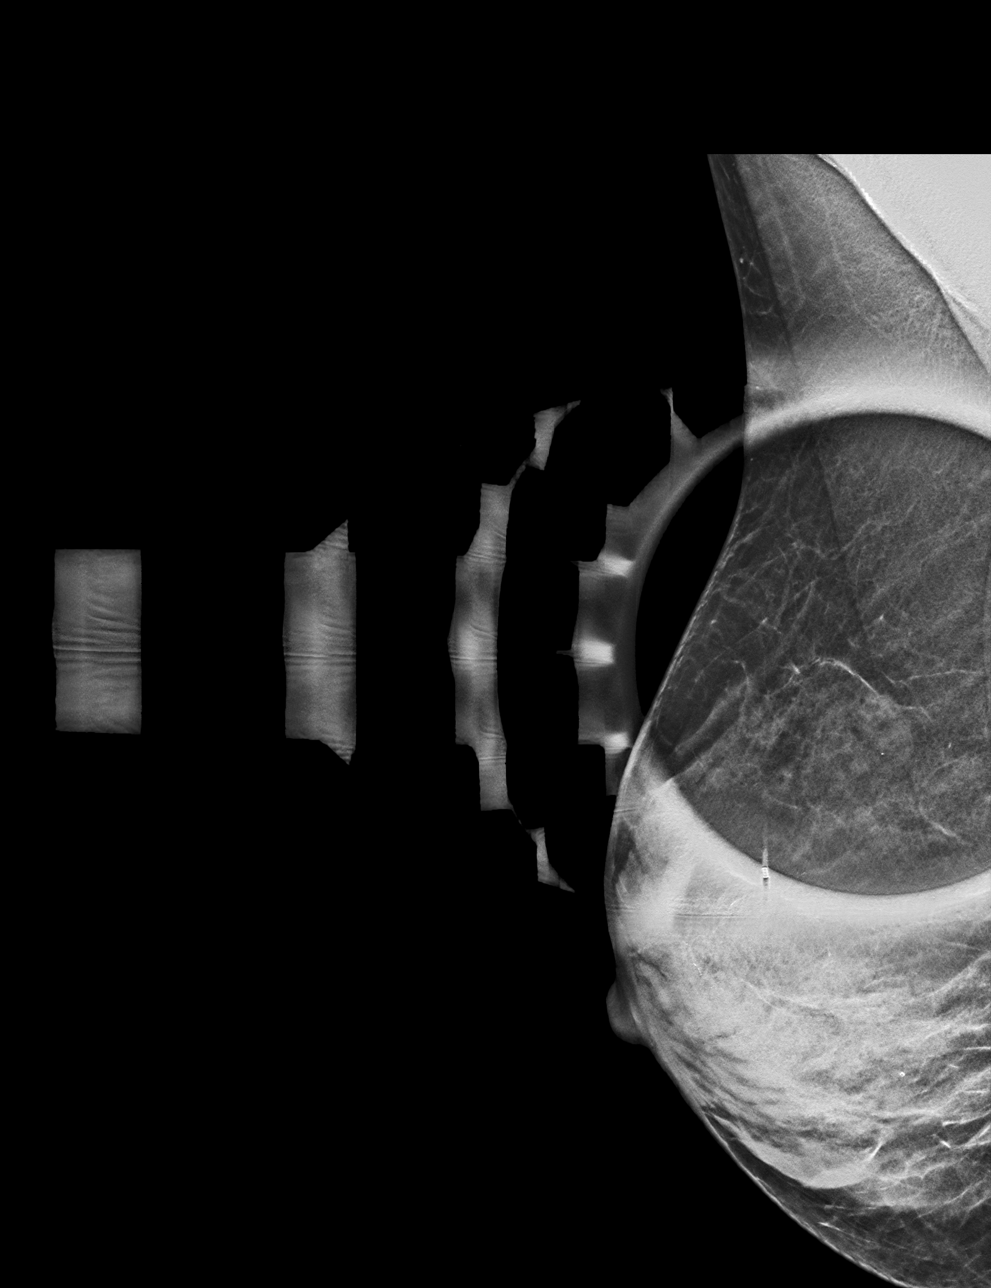

[R CC tomo · tomo slice 17/33.0]
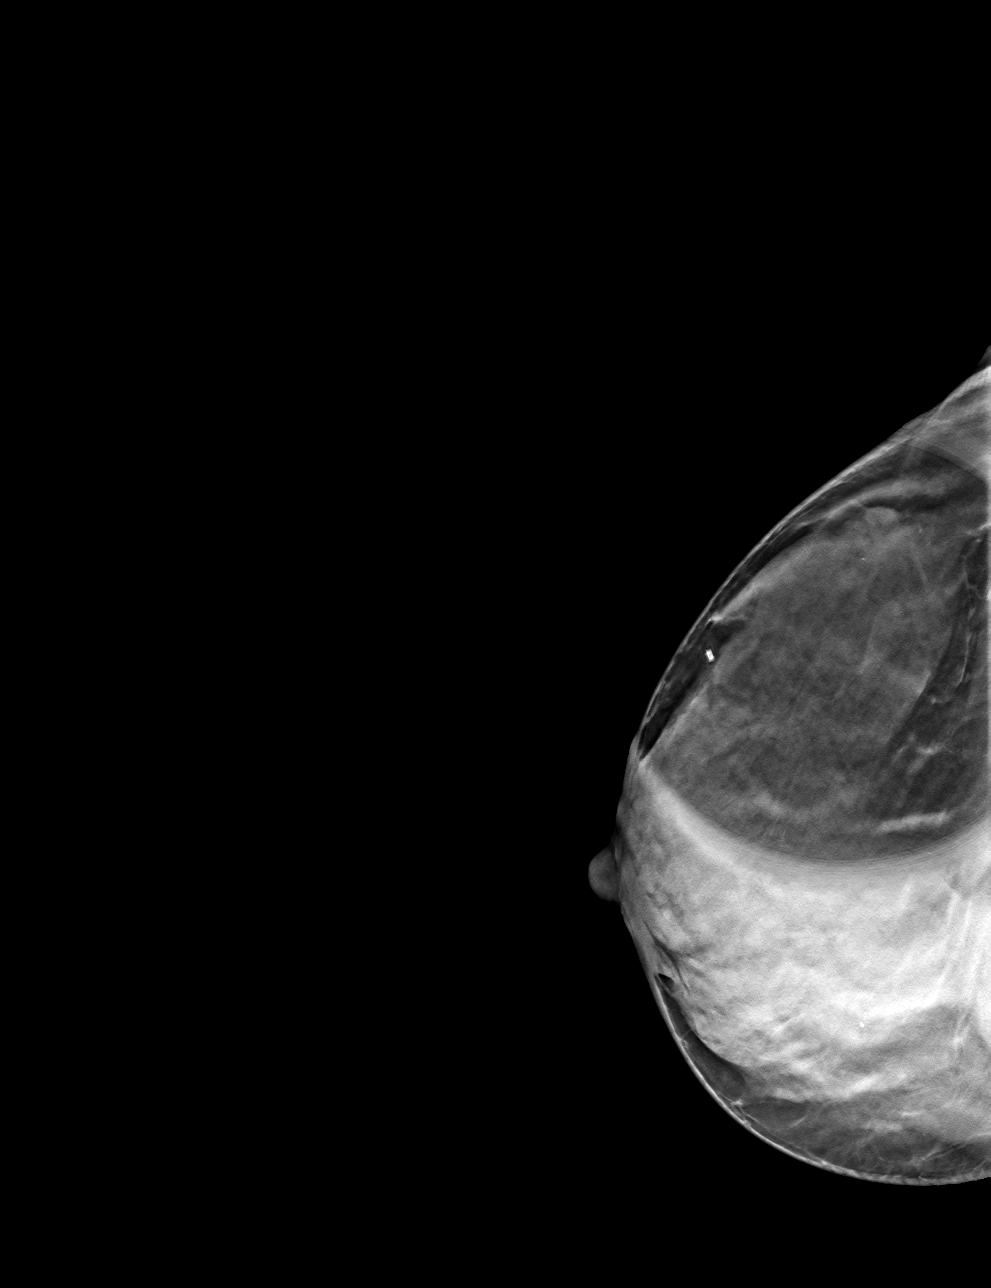

[R MLO tomo · tomo slice 15/30.0]
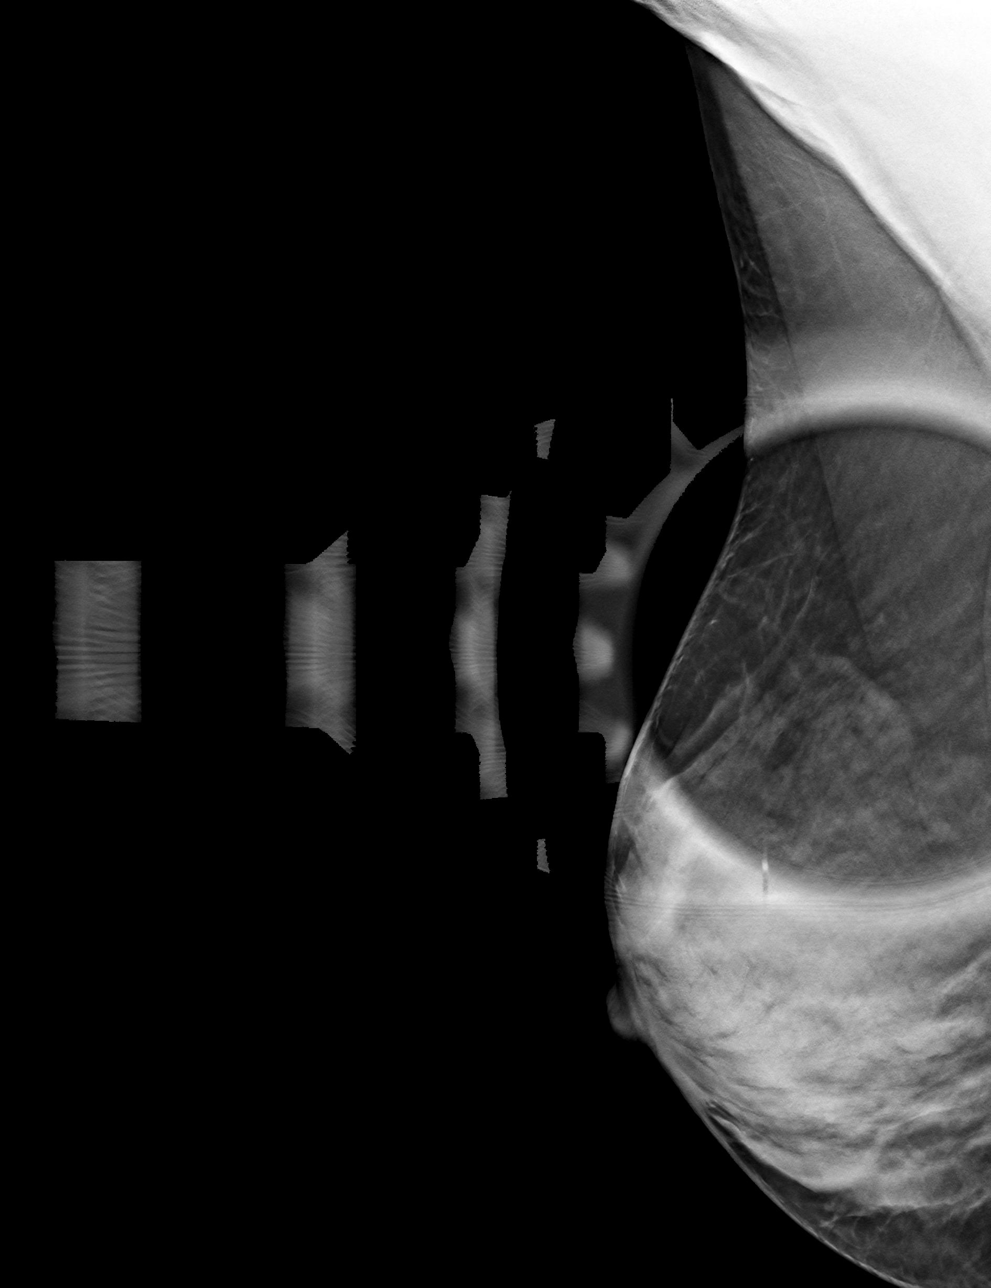

[4 of 12 positions shown; findings below may reference images not displayed]

ACR Breast Density Category d: The breast tissue is extremely dense,
which lowers the sensitivity of mammography.
FINDINGS: Spot compression tomosynthesis images through the upper outer
posterior right breast demonstrates a possible obscured mass.

Mammographic images were processed with CAD.

On physical exam, there is a firm small palpable lump in the
upper-outer quadrant of the right breast.

Ultrasound targeted to the right breast at 10 30, 5 cm from the
nipple demonstrates an ill-defined irregular mass with indistinct
margins measuring 1.0 x 0.7 x 1.0 cm. Normal lymph nodes are seen in
the right axilla.
IMPRESSION: 1.  There is a suspicious mass in the right breast at 10 o'clock.

2.  No evidence of right axillary lymphadenopathy.

RECOMMENDATION:
Ultrasound guided biopsy is recommended for the right breast mass.
This has been scheduled for [DATE] at [DATE] p.m.

I have discussed the findings and recommendations with the patient.
If applicable, a reminder letter will be sent to the patient
regarding the next appointment.

BI-RADS CATEGORY  5: Highly suggestive of malignancy.

## 2019-06-11 ENCOUNTER — Ambulatory Visit
Admission: RE | Admit: 2019-06-11 | Discharge: 2019-06-11 | Disposition: A | Discharge status: Home / Self Care | Payer: Medicare Other | Source: Ambulatory Visit | Attending: Physician Assistant | Admitting: Physician Assistant

## 2019-06-11 ENCOUNTER — Other Ambulatory Visit: Payer: Self-pay

## 2019-06-11 DIAGNOSIS — N6310 Unspecified lump in the right breast, unspecified quadrant: Secondary | ICD-10-CM

## 2019-06-11 IMAGING — US US  BREAST BX W/ LOC DEV 1ST LESION IMG BX SPEC US GUIDE*R*
1 series · 7 of 7 positions shown · non-contrast
Comparison: Previous exam(s).
COMPARISON: Previous exam(s).

Addendum:
CLINICAL DATA: Patient with indeterminate right breast mass 10:30
o'clock.

EXAM:
ULTRASOUND GUIDED RIGHT BREAST CORE NEEDLE BIOPSY

[Series 1: us breast bx w/ loc dev 1st lesion img bx spec us  · 0.04mm/px · 7 of 7 slices shown]
[im 1/7]
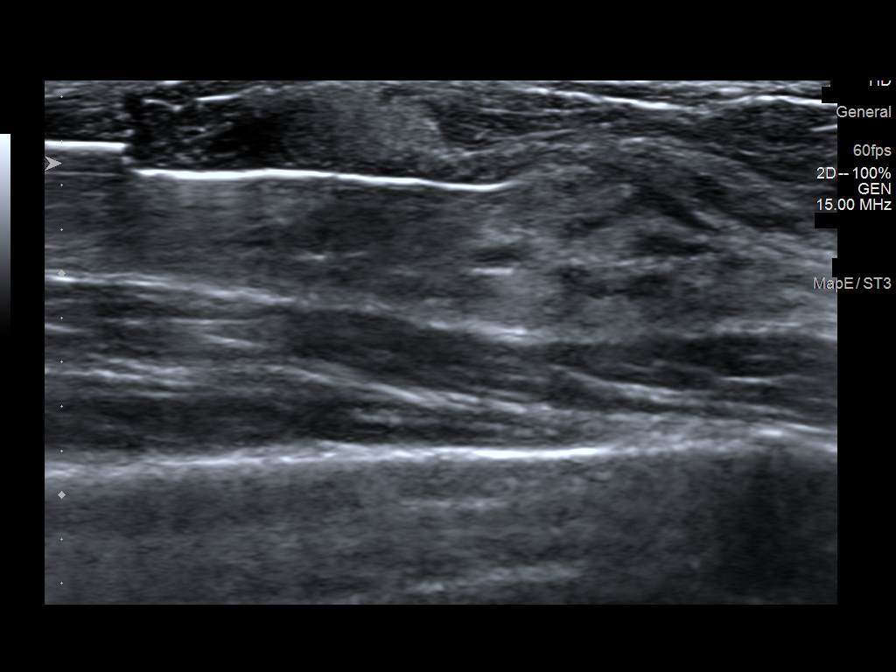
[im 2/7]
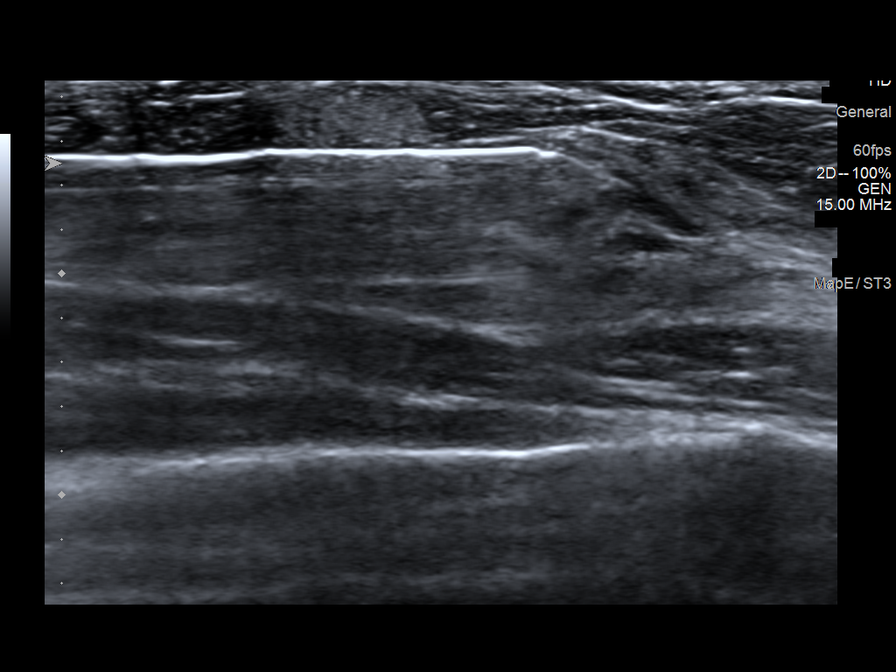
[im 3/7]
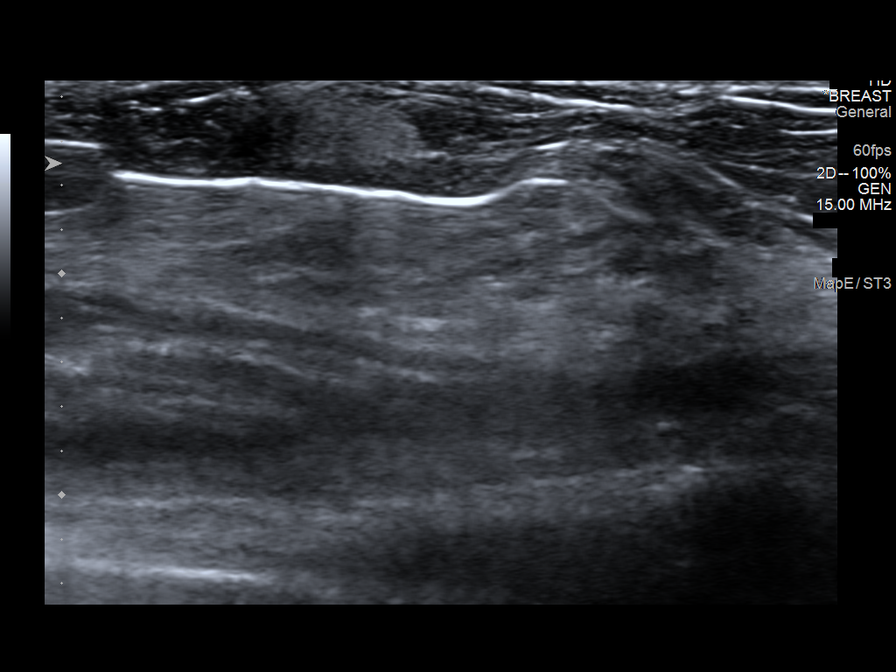
[im 4/7]
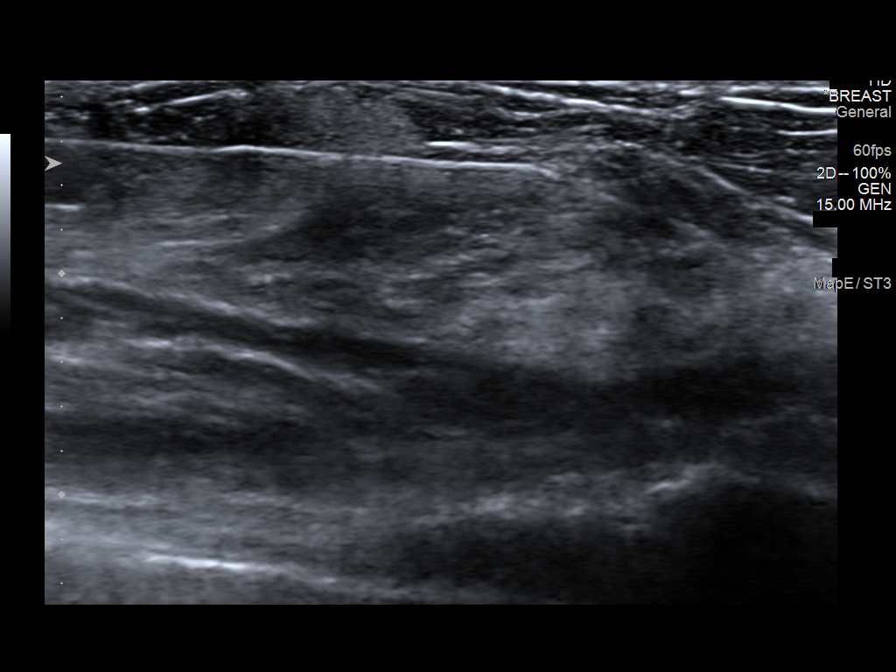
[im 5/7]
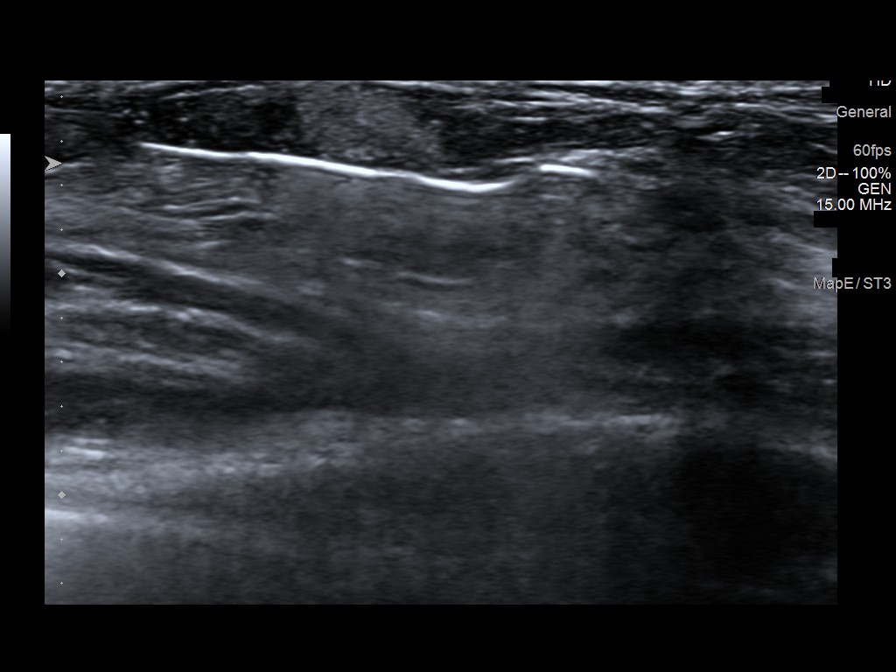
[im 6/7]
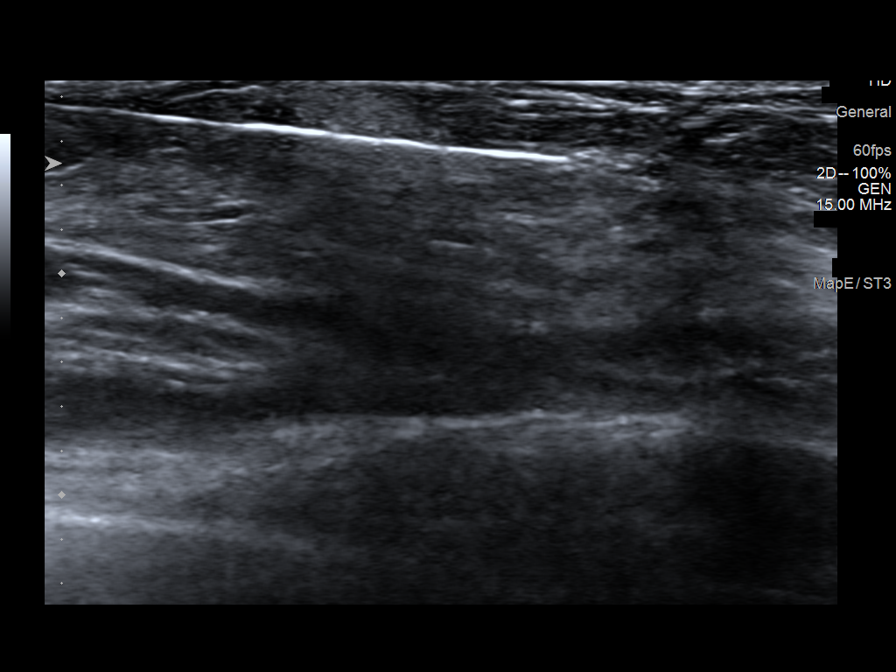
[im 7/7]
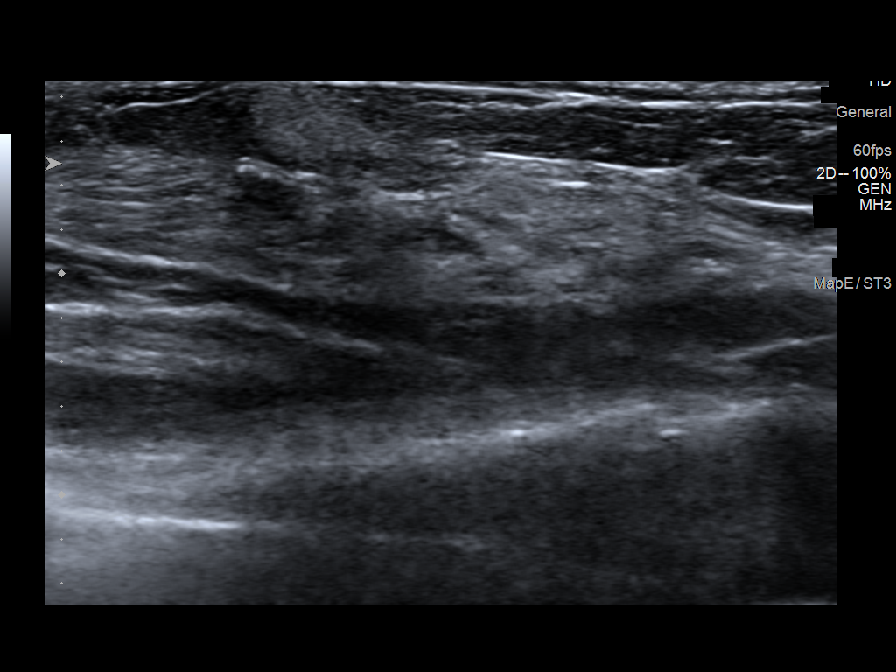

[7 of 7 positions shown; findings below may reference images not displayed]



Lesion quadrant: Upper outer quadrant

Using sterile technique and 1% Lidocaine as local anesthetic, under
direct ultrasound visualization, a 14 gauge CATACUTAN device was
used to perform biopsy of right breast mass 10:30 o'clock using a
lateral approach. At the conclusion of the procedure ribbon shaped
tissue marker clip was deployed into the biopsy cavity. Follow up 2
view mammogram was performed and dictated separately.
IMPRESSION: Ultrasound guided biopsy of right breast mass 10:30 o'clock. No
apparent complications.

ADDENDUM:
Pathology revealed GRADE I-II INVASIVE MAMMARY CARCINOMA of the
RIGHT Breast, 10:30 o'clock. This was found to be concordant by Dr.
CATACUTAN.

Pathology results were discussed with the patient by telephone. The
patient reported doing well after the biopsy with tenderness at the
site. Post biopsy instructions and care were reviewed and questions
were answered. The patient was encouraged to call The [REDACTED]

The patient was referred to [REDACTED]
[REDACTED] at [REDACTED] on
[DATE].

Pathology results reported by CATACUTAN RN on [DATE].



Lesion quadrant: Upper outer quadrant

Using sterile technique and 1% Lidocaine as local anesthetic, under
direct ultrasound visualization, a 14 gauge CATACUTAN device was
used to perform biopsy of right breast mass 10:30 o'clock using a
lateral approach. At the conclusion of the procedure ribbon shaped
tissue marker clip was deployed into the biopsy cavity. Follow up 2
view mammogram was performed and dictated separately.
IMPRESSION: Ultrasound guided biopsy of right breast mass 10:30 o'clock. No
apparent complications.

## 2019-06-11 IMAGING — MG MM BREAST LOCALIZATION CLIP
4 series · 4 of 12 positions shown · non-contrast
Comparison: Previous exam(s).

CLINICAL DATA: Patient with indeterminate right breast mass.

EXAM:
DIAGNOSTIC RIGHT MAMMOGRAM POST ULTRASOUND BIOPSY

[R CC synth-2D]
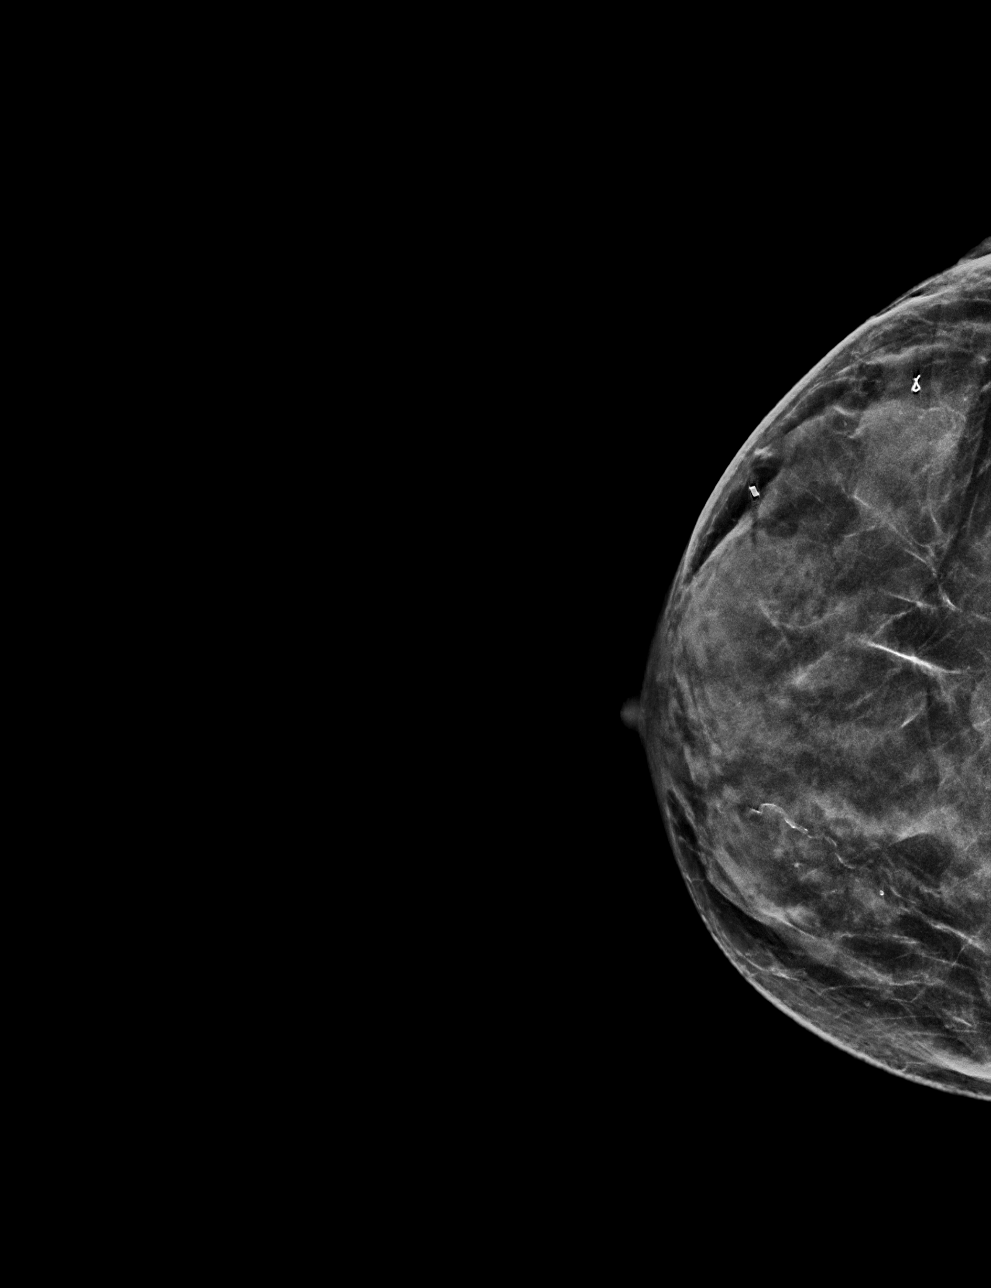

[R ML synth-2D]
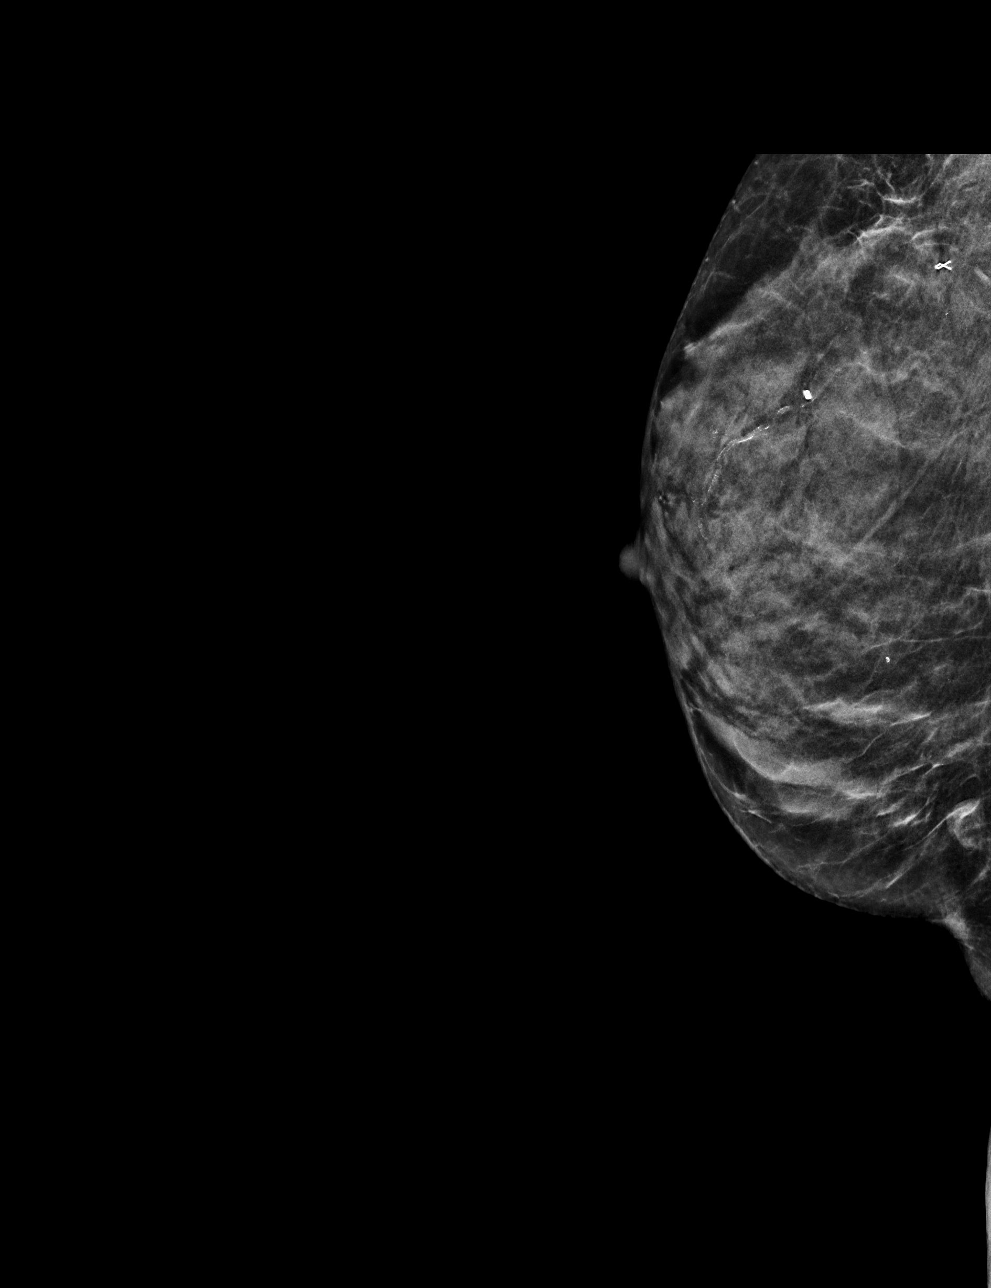

[R ML tomo · tomo slice 21/41.0]
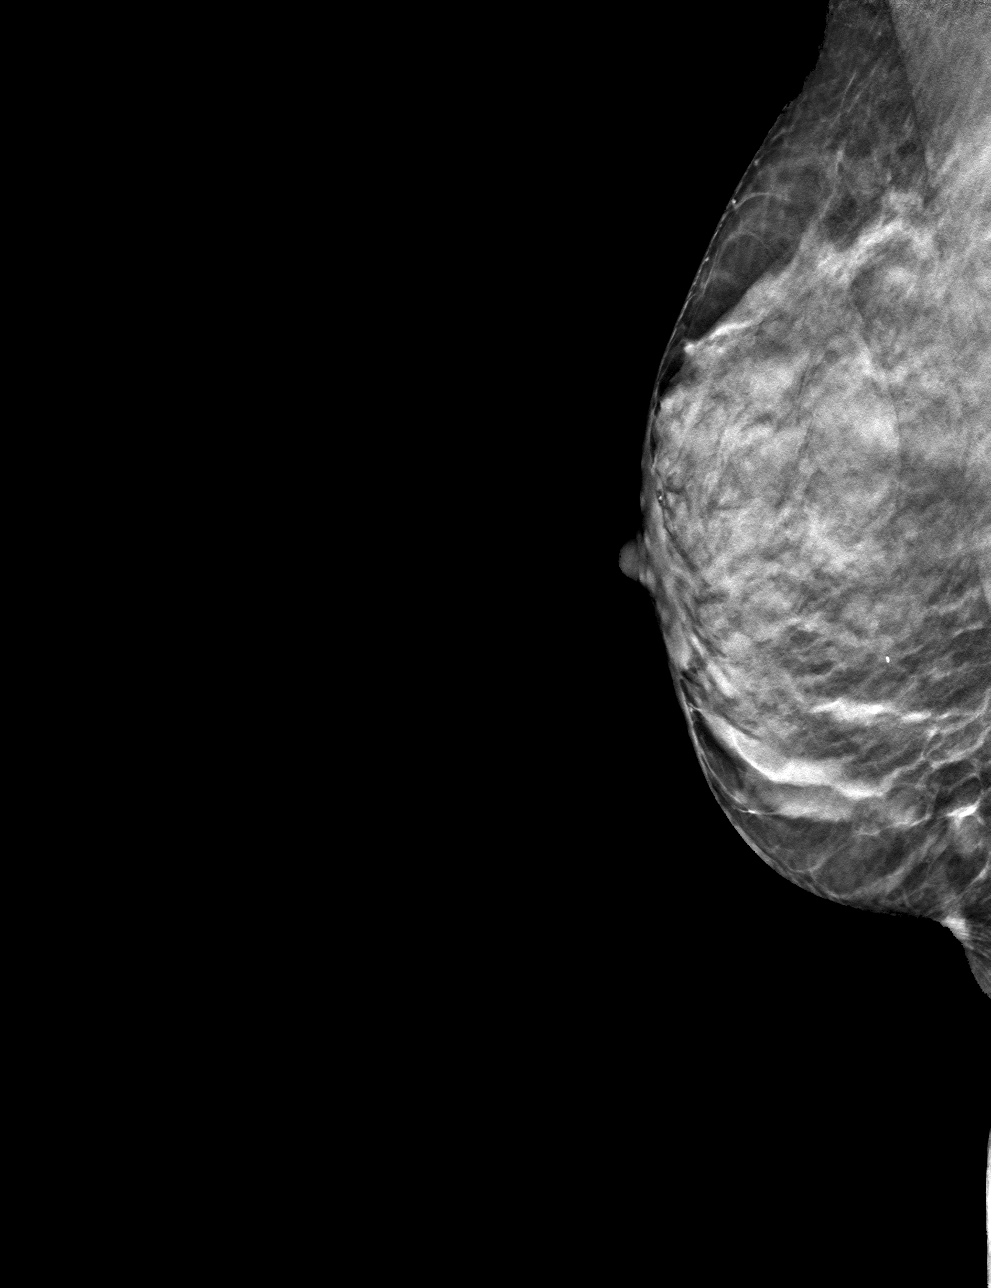

[R CC tomo · tomo slice 25/48.0]
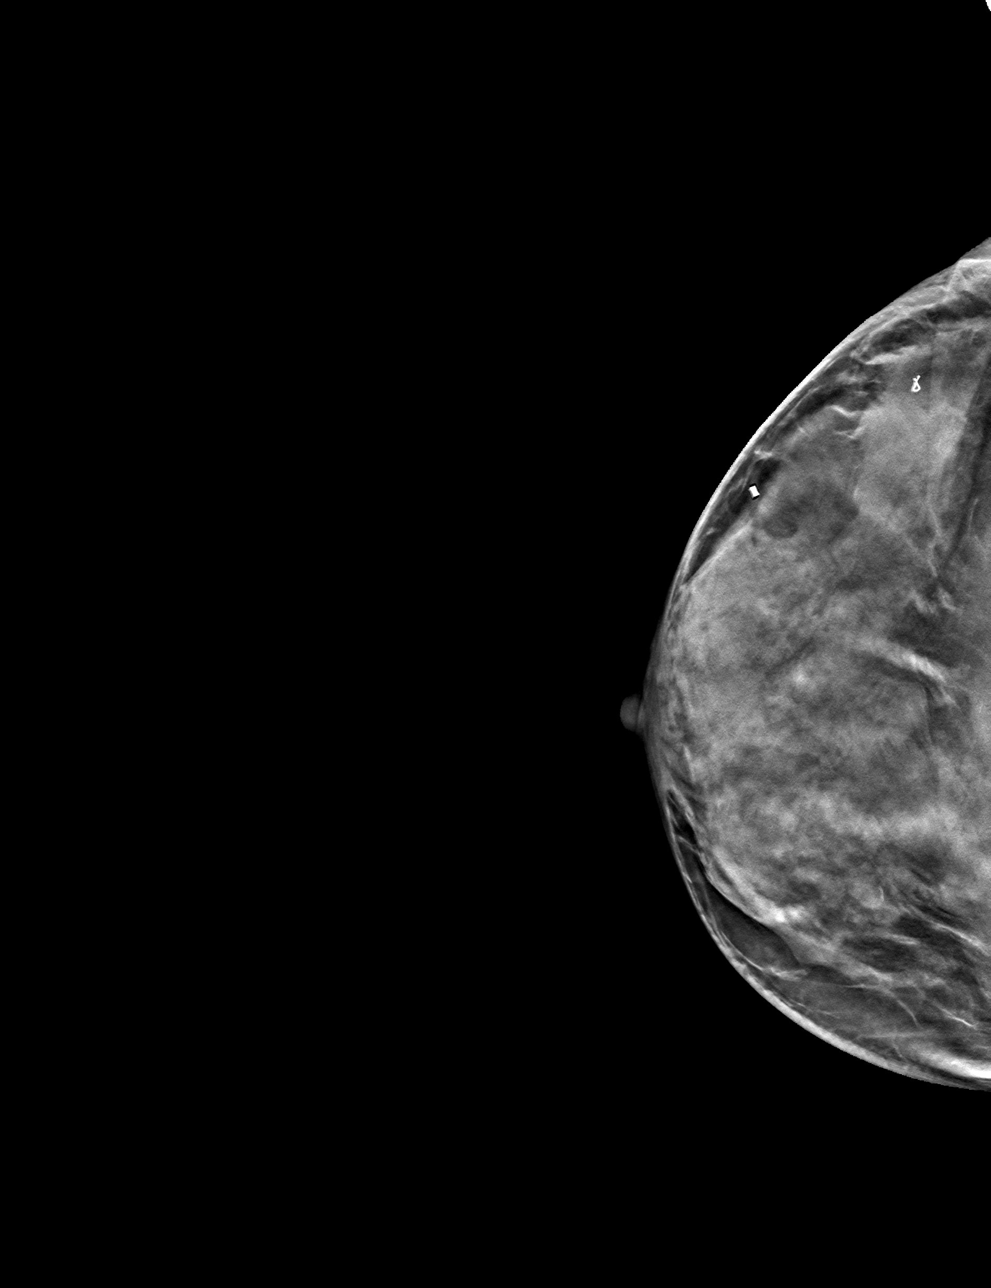

[4 of 12 positions shown; findings below may reference images not displayed]

FINDINGS: Mammographic images were obtained following ultrasound guided biopsy
of right breast mass 10:30 o'clock. The biopsy marking clip is in
expected position at the site of biopsy.
IMPRESSION: Appropriate positioning of the ribbon shaped biopsy marking clip at
the site of biopsy in the right breast mass 10:30 o'clock.

Final Assessment: Post Procedure Mammograms for Marker Placement

## 2019-06-13 ENCOUNTER — Encounter: Payer: Self-pay | Admitting: *Deleted

## 2019-06-13 DIAGNOSIS — Z170 Estrogen receptor positive status [ER+]: Secondary | ICD-10-CM

## 2019-06-13 DIAGNOSIS — C50411 Malignant neoplasm of upper-outer quadrant of right female breast: Secondary | ICD-10-CM | POA: Insufficient documentation

## 2019-06-16 ENCOUNTER — Other Ambulatory Visit: Payer: Self-pay | Admitting: Physician Assistant

## 2019-06-16 DIAGNOSIS — M810 Age-related osteoporosis without current pathological fracture: Secondary | ICD-10-CM

## 2019-06-17 NOTE — Progress Notes (Signed)
[place] NOTE  Patient Care Team: [name], [place] as PCP - General (Nurse Practitioner) Mauro Kaufmann, RN as Oncology Nurse Navigator Rockwell Germany, RN as Oncology Nurse Navigator Stark Klein, MD as Consulting Physician (General Surgery) Nicholas Lose, MD as Consulting Physician (Hematology and Oncology) Gery Pray, MD as Consulting Physician (Radiation Oncology)  CHIEF COMPLAINTS/PURPOSE OF CONSULTATION:  Newly diagnosed breast cancer  HISTORY OF PRESENTING ILLNESS:  Carrie Serrano 69 y.o. female is here because of recent diagnosis of invasive ductal carcinoma of the right breast. Screening mammogram on 05/23/19 detected a right breast mass, palpable on exam. Diagnostic mammogram and [place] on 06/03/19 showed a 1.0cm mass in the right breast at the 10:30 position. Biopsy on 06/10/18 showed invasive mammary carcinoma, grade 1, HER-2 equivocal by IHC, negative by FISH, ER+ 100%, PR+ 90%, Ki67 15%. She presents to the clinic today for initial evaluation and discussion of treatment options.   I reviewed her records extensively and collaborated the history with the patient.  SUMMARY OF ONCOLOGIC HISTORY: Oncology History  Malignant neoplasm of upper-outer quadrant of right breast in female, estrogen receptor positive ([place])  06/13/2019 Initial Diagnosis   Screening mammogram detected a right breast mass, palpable on exam. Diagnostic mammogram showed a 1.0cm mass in the right breast at the 10:30 position. Biopsy showed invasive mammary carcinoma, grade 1, HER-2 equivocal by IHC, negative by FISH, ER+ 100%, PR+ 90%, Ki67 15%     MEDICAL HISTORY:  Past Medical History:  Diagnosis Date  . HSV-2 (herpes simplex virus 2) infection   . Osteopenia   . PAT (paroxysmal atrial tachycardia) ([place])   . SVT (supraventricular tachycardia) ([place])     SURGICAL HISTORY: Past Surgical History:  Procedure Laterality Date  . ABDOMINAL HYSTERECTOMY    . ABLATION OF  DYSRHYTHMIC FOCUS  06/13/2012   SVT    Dr  Lovena Le  . COLONOSCOPY  2012  . COLONOSCOPY WITH PROPOFOL N/A 06/26/2016   Procedure: COLONOSCOPY WITH PROPOFOL;  Surgeon: Lollie Sails, MD;  Location[facility] ENDOSCOPY;  Service: Endoscopy;  Laterality: N/A;  . ELECTROPHYSIOLOGY STUDY N/A 06/13/2012   Procedure: ELECTROPHYSIOLOGY STUDY;  Surgeon: Evans Lance, MD;  Location[facility] CATH LAB;  Service: Cardiovascular;  Laterality: N/A;  . FLEXIBLE SIGMOIDOSCOPY N/A 01/24/2017   Procedure: FLEXIBLE SIGMOIDOSCOPY;  Surgeon: Toledo, Benay Pike, MD;  Location: [facility] ENDOSCOPY;  Service: Gastroenterology;  Laterality: N/A;  . SUPRAVENTRICULAR TACHYCARDIA ABLATION N/A 06/13/2012   Procedure: SUPRAVENTRICULAR TACHYCARDIA ABLATION;  Surgeon: Evans Lance, MD;  Location: [facility] CATH LAB;  Service: Cardiovascular;  Laterality: N/A;  . varicose vein injection in left lower leg      SOCIAL HISTORY: Social History   Socioeconomic History  . Marital status: Single    Spouse name: Not on file  . Number of children: Not on file  . Years of education: Not on file  . Highest education level: Not on file  Occupational History  . Not on file  Tobacco Use  . Smoking status: Former Smoker    Packs/day: 0.50    Years: 25.00    Pack years: 12.50    Types: Cigarettes    Quit date: 05/01/2014    Years since quitting: 5.1  . Smokeless tobacco: Never Used  . Tobacco comment: 5 cigarettes a day.  Substance and Sexual Activity  . Alcohol use: Yes    Alcohol/week: 8.0 - 10.0 standard drinks    Types: 1 Cans of beer, 7 - 9 Standard drinks  or equivalent per week    Comment: one beer a day  . Drug use: No  . Sexual activity: Not on file  Other Topics Concern  . Not on file  Social History Narrative  . Not on file   Social Determinants of Health   Financial Resource Strain:   . Difficulty of Paying Living Expenses: Not on file  Food Insecurity:   . Worried About Charity fundraiser in the Last Year: Not on file  .  Ran Out of Food in the Last Year: Not on file  Transportation Needs:   . Lack of Transportation (Medical): Not on file  . Lack of Transportation (Non-Medical): Not on file  Physical Activity:   . Days of Exercise per Week: Not on file  . Minutes of Exercise per Session: Not on file  Stress:   . Feeling of Stress : Not on file  Social Connections:   . Frequency of Communication with Friends and Family: Not on file  . Frequency of Social Gatherings with Friends and Family: Not on file  . Attends Religious Services: Not on file  . Active Member of Clubs or Organizations: Not on file  . Attends Archivist Meetings: Not on file  . Marital Status: Not on file  Intimate Partner Violence:   . Fear of Current or Ex-Partner: Not on file  . Emotionally Abused: Not on file  . Physically Abused: Not on file  . Sexually Abused: Not on file    FAMILY HISTORY: Family History  Problem Relation Age of Onset  . Stroke Mother   . Diabetes Mother   . Heart attack Father   . Hypertension Father     ALLERGIES:  is allergic to latex.  MEDICATIONS:  Current Outpatient Medications  Medication Sig Dispense Refill  . Calcium Carbonate-Vit D-Min (CALCIUM 1200) 1200-1000 MG-UNIT CHEW Chew 1 tablet by mouth daily.    Marland Kitchen denosumab (PROLIA) 60 MG/ML SOLN injection Inject 60 mg into the skin every 6 (six) months. Administer in upper arm, thigh, or abdomen    . Multiple Vitamin (MULTIVITAMIN) tablet Take 1 tablet by mouth daily.    . valACYclovir (VALTREX) 500 MG tablet Take 500 mg by mouth 2 (two) times daily as needed. For outbreak    . vitamin B-12 (CYANOCOBALAMIN) 1000 MCG tablet Take 1,000 mcg by mouth daily.    . Vitamin D, Cholecalciferol, 400 units CAPS Take 800 Units by mouth.     No current facility-administered medications for this visit.    REVIEW OF SYSTEMS:   Constitutional: Denies fevers, chills or abnormal night sweats Eyes: Denies blurriness of vision, double vision or watery  eyes Ears, nose, mouth, throat, and face: Denies mucositis or sore throat Respiratory: Denies cough, dyspnea or wheezes Cardiovascular: Denies palpitation, chest discomfort or lower extremity swelling Gastrointestinal:  Denies nausea, heartburn or change in bowel habits Skin: Denies abnormal skin rashes Lymphatics: Denies new lymphadenopathy or easy bruising Neurological:Denies numbness, tingling or new weaknesses Behavioral/Psych: Mood is stable, no new changes  Breast: Palpable right breast mass All other systems were reviewed with the patient and are negative.  PHYSICAL EXAMINATION: ECOG PERFORMANCE STATUS: 1 - Symptomatic but completely ambulatory  Vitals:   06/18/19 0906  BP: 117/82  Pulse: (!) 56  Resp: 18  Temp: (!) 97.2 F (36.2 C)  SpO2: 100%   Filed Weights   06/18/19 0906  Weight: 122 lb 3.2 oz (55.4 kg)    GENERAL:alert, no distress and comfortable SKIN: skin  color, texture, turgor are normal, no rashes or significant lesions EYES: normal, conjunctiva are pink and non-injected, sclera clear OROPHARYNX:no exudate, no erythema and lips, buccal mucosa, and tongue normal  NECK: supple, thyroid normal size, non-tender, without nodularity LYMPH:  no palpable lymphadenopathy in the cervical, axillary or inguinal LUNGS: clear to auscultation and percussion with normal breathing effort HEART: regular rate & rhythm and no murmurs and no lower extremity edema ABDOMEN:abdomen soft, non-tender and normal bowel sounds Musculoskeletal:no cyanosis of digits and no clubbing  PSYCH: alert & oriented x 3 with fluent speech NEURO: no focal motor/sensory deficits BREAST: No palpable nodules in breast. No palpable axillary or supraclavicular lymphadenopathy (exam performed in the presence of a chaperone)   LABORATORY DATA:  I have reviewed the data as listed Lab Results  Component Value Date   WBC 5.2 06/18/2019   HGB 15.3 (H) 06/18/2019   HCT 45.4 06/18/2019   MCV 97.6  06/18/2019   PLT 225 06/18/2019   Lab Results  Component Value Date   NA 141 06/18/2019   K 4.8 06/18/2019   CL 107 06/18/2019   CO2 26 06/18/2019    RADIOGRAPHIC STUDIES: I have personally reviewed the radiological reports and agreed with the findings in the report.  ASSESSMENT AND PLAN:  Malignant neoplasm of upper-outer quadrant of right breast in female, estrogen receptor positive ([place]) 06/13/2019:Screening mammogram detected a right breast mass, palpable on exam. Diagnostic mammogram showed a 1.0cm mass in the right breast at the 10:30 position. Biopsy showed invasive mammary carcinoma, grade 1, HER-2 equivocal by IHC, negative by FISH, ER+ 100%, PR+ 90%, Ki67 15%  Pathology and radiology counseling:Discussed with the patient, the details of pathology including the type of breast cancer,the clinical staging, the significance of ER, PR and HER-2/neu receptors and the implications for treatment. After reviewing the pathology in detail, we proceeded to discuss the different treatment options between surgery, radiation, chemotherapy, antiestrogen therapies.  Recommendations: 1. Breast conserving surgery followed by 2. Oncotype DX testing to determine if chemotherapy would be of any benefit followed by 3. Adjuvant radiation therapy followed by 4. Adjuvant antiestrogen therapy  Oncotype counseling: I discussed Oncotype DX test. I explained to the patient that this is a 21 gene panel to evaluate patient tumors DNA to calculate recurrence score. This would help determine whether patient has high risk or intermediate risk or low risk breast cancer. She understands that if her tumor was found to be high risk, she would benefit from systemic chemotherapy. If low risk, no need of chemotherapy. If she was found to be intermediate risk, we would need to evaluate the score as well as other risk factors and determine if an abbreviated chemotherapy may be of benefit.  Return to clinic after surgery to  discuss final pathology report and then determine if Oncotype DX testing will need to be sent.      All questions were answered. The patient knows to call the clinic with any problems, questions or concerns.   Rulon Eisenmenger, MD, MPH 06/18/2019    I, Molly Dorshimer, am acting as scribe for Nicholas Lose, MD.  I have reviewed the above documentation for accuracy and completeness, and I agree with the above.

## 2019-06-18 ENCOUNTER — Inpatient Hospital Stay: Payer: Medicare Other | Attending: Hematology and Oncology | Admitting: Hematology and Oncology

## 2019-06-18 ENCOUNTER — Encounter: Payer: Self-pay | Admitting: Physical Therapy

## 2019-06-18 ENCOUNTER — Other Ambulatory Visit: Payer: Self-pay | Admitting: General Surgery

## 2019-06-18 ENCOUNTER — Other Ambulatory Visit: Payer: Self-pay | Admitting: *Deleted

## 2019-06-18 ENCOUNTER — Ambulatory Visit: Payer: Medicare Other | Attending: General Surgery | Admitting: Physical Therapy

## 2019-06-18 ENCOUNTER — Encounter: Payer: Self-pay | Admitting: Hematology and Oncology

## 2019-06-18 ENCOUNTER — Ambulatory Visit
Admission: RE | Admit: 2019-06-18 | Discharge: 2019-06-18 | Disposition: A | Discharge status: Home / Self Care | Payer: Medicare Other | Source: Ambulatory Visit | Attending: Radiation Oncology | Admitting: Radiation Oncology

## 2019-06-18 ENCOUNTER — Inpatient Hospital Stay: Payer: Medicare Other

## 2019-06-18 ENCOUNTER — Other Ambulatory Visit: Payer: Self-pay

## 2019-06-18 DIAGNOSIS — Z170 Estrogen receptor positive status [ER+]: Secondary | ICD-10-CM | POA: Diagnosis present

## 2019-06-18 DIAGNOSIS — C50411 Malignant neoplasm of upper-outer quadrant of right female breast: Secondary | ICD-10-CM

## 2019-06-18 DIAGNOSIS — R293 Abnormal posture: Secondary | ICD-10-CM | POA: Diagnosis present

## 2019-06-18 DIAGNOSIS — M8580 Other specified disorders of bone density and structure, unspecified site: Secondary | ICD-10-CM | POA: Diagnosis not present

## 2019-06-18 LAB — CMP (CANCER CENTER ONLY)
ALT: 26 U/L (ref 0–44)
AST: 28 U/L (ref 15–41)
Albumin: 4.2 g/dL (ref 3.5–5.0)
Alkaline Phosphatase: 75 U/L (ref 38–126)
Anion gap: 8 (ref 5–15)
BUN: 15 mg/dL (ref 8–23)
CO2: 26 mmol/L (ref 22–32)
Calcium: 9.5 mg/dL (ref 8.9–10.3)
Chloride: 107 mmol/L (ref 98–111)
Creatinine: 0.85 mg/dL (ref 0.44–1.00)
GFR, Est AFR Am: >60 mL/min (ref >60)
GFR, Estimated: >60 mL/min (ref >60)
Glucose, Bld: 96 mg/dL (ref 70–99)
Potassium: 4.8 mmol/L (ref 3.5–5.1)
Sodium: 141 mmol/L (ref 135–145)
Total Bilirubin: 0.6 mg/dL (ref 0.3–1.2)
Total Protein: 6.8 g/dL (ref 6.5–8.1)

## 2019-06-18 LAB — CBC WITH DIFFERENTIAL (CANCER CENTER ONLY)
Abs Immature Granulocytes: 0.01 10*3/uL (ref 0.00–0.07)
Basophils Absolute: 0.1 10*3/uL (ref 0.0–0.1)
Basophils Relative: 1 %
Eosinophils Absolute: 0.1 10*3/uL (ref 0.0–0.5)
Eosinophils Relative: 2 %
HCT: 45.4 % (ref 36.0–46.0)
Hemoglobin: 15.3 g/dL — ABNORMAL HIGH (ref 12.0–15.0)
Immature Granulocytes: 0 %
Lymphocytes Relative: 24 %
Lymphs Abs: 1.2 10*3/uL (ref 0.7–4.0)
MCH: 32.9 pg (ref 26.0–34.0)
MCHC: 33.7 g/dL (ref 30.0–36.0)
MCV: 97.6 fL (ref 80.0–100.0)
Monocytes Absolute: 0.6 10*3/uL (ref 0.1–1.0)
Monocytes Relative: 12 %
Neutro Abs: 3.2 10*3/uL (ref 1.7–7.7)
Neutrophils Relative %: 61 %
Platelet Count: 225 10*3/uL (ref 150–400)
RBC: 4.65 MIL/uL (ref 3.87–5.11)
RDW: 12.4 % (ref 11.5–15.5)
WBC Count: 5.2 10*3/uL (ref 4.0–10.5)
nRBC: 0 % (ref 0.0–0.2)

## 2019-06-18 LAB — GENETIC SCREENING ORDER

## 2019-06-18 NOTE — Patient Instructions (Signed)

## 2019-06-18 NOTE — Assessment & Plan Note (Signed)
06/13/2019:Screening mammogram detected a right breast mass, palpable on exam. Diagnostic mammogram showed a 1.0cm mass in the right breast at the 10:30 position. Biopsy showed invasive mammary carcinoma, grade 1, HER-2 equivocal by IHC, negative by FISH, ER+ 100%, PR+ 90%, Ki67 15%  Pathology and radiology counseling:Discussed with the patient, the details of pathology including the type of breast cancer,the clinical staging, the significance of ER, PR and HER-2/neu receptors and the implications for treatment. After reviewing the pathology in detail, we proceeded to discuss the different treatment options between surgery, radiation, chemotherapy, antiestrogen therapies.  Recommendations: 1. Breast conserving surgery followed by 2. Oncotype DX testing to determine if chemotherapy would be of any benefit followed by 3. Adjuvant radiation therapy followed by 4. Adjuvant antiestrogen therapy  Oncotype counseling: I discussed Oncotype DX test. I explained to the patient that this is a 21 gene panel to evaluate patient tumors DNA to calculate recurrence score. This would help determine whether patient has high risk or intermediate risk or low risk breast cancer. She understands that if her tumor was found to be high risk, she would benefit from systemic chemotherapy. If low risk, no need of chemotherapy. If she was found to be intermediate risk, we would need to evaluate the score as well as other risk factors and determine if an abbreviated chemotherapy may be of benefit.  Return to clinic after surgery to discuss final pathology report and then determine if Oncotype DX testing will need to be sent.    

## 2019-06-18 NOTE — Progress Notes (Signed)
Radiation Oncology         [phone] ________________________________  Multidisciplinary Breast Oncology Clinic [facility]) Initial Outpatient Consultation  Name: Carrie Serrano MRN: [id number]  Date: 06/18/2019  DOB: Jan 28, 1951  IE:[facility], [place], [place]  Stark Klein, MD   REFERRING PHYSICIAN: Stark Klein, MD  DIAGNOSIS: The encounter diagnosis was Malignant neoplasm of upper-outer quadrant of right breast in female, estrogen receptor positive ([place]).  Clinical Stage T1b, N0, Right Breast UOQ, Invasive Ductal Carcinoma, ER+ / PR+ / Her2-, Grade 1-2    ICD-10-CM   1. Malignant neoplasm of upper-outer quadrant of right breast in female, estrogen receptor positive ([place])  C50.411    Z17.0     HISTORY OF PRESENT ILLNESS::Carrie Serrano is a 69 y.o. female who is presenting to the office today for evaluation of her newly diagnosed breast cancer. She is not accompanied by anyone. She is doing well overall.   She had routine screening mammography on 05/22/2019 showing a possible mass in the right breast. She underwent unilateral diagnostic mammography with tomography and right breast ultrasonography at [facility] on 06/03/2019 showing a suspicious mass in the right breast at 10 o'clock. No evidence of right axillary lymphadenopathy.  Biopsy on 06/11/2019 showed grade 1-2 invasive ductal carcinoma. Prognostic indicators significant for estrogen receptor, 100% positive with strong staining intensity and progesterone receptor, 90% positive with moderate staining intensity. Proliferation marker Ki67 at 15%. HER2 negative.  Menarche: 66-55 years old Age at first live birth: N/A GP: 0 LMP: 1990 Contraceptive: Yes; [place] HRT: Yes; 2006 - 2008   The patient was referred today for presentation in the multidisciplinary conference.  Radiology studies and pathology slides were presented there for review and discussion of treatment options.  A consensus was discussed regarding potential  next steps.  PREVIOUS RADIATION THERAPY: No  PAST MEDICAL HISTORY:  Past Medical History:  Diagnosis Date  . HSV-2 (herpes simplex virus 2) infection   . Osteopenia   . PAT (paroxysmal atrial tachycardia) ([place])   . SVT (supraventricular tachycardia) ([place])     PAST SURGICAL HISTORY: Past Surgical History:  Procedure Laterality Date  . ABDOMINAL HYSTERECTOMY    . ABLATION OF DYSRHYTHMIC FOCUS  06/13/2012   SVT    Dr  Lovena Le  . COLONOSCOPY  2012  . COLONOSCOPY WITH PROPOFOL N/A 06/26/2016   Procedure: COLONOSCOPY WITH PROPOFOL;  Surgeon: Lollie Sails, MD;  Location: [facility] ENDOSCOPY;  Service: Endoscopy;  Laterality: N/A;  . ELECTROPHYSIOLOGY STUDY N/A 06/13/2012   Procedure: ELECTROPHYSIOLOGY STUDY;  Surgeon: Evans Lance, MD;  Location: [facility] CATH LAB;  Service: Cardiovascular;  Laterality: N/A;  . FLEXIBLE SIGMOIDOSCOPY N/A 01/24/2017   Procedure: FLEXIBLE SIGMOIDOSCOPY;  Surgeon: Toledo, Benay Pike, MD;  Location: [facility] ENDOSCOPY;  Service: Gastroenterology;  Laterality: N/A;  . SUPRAVENTRICULAR TACHYCARDIA ABLATION N/A 06/13/2012   Procedure: SUPRAVENTRICULAR TACHYCARDIA ABLATION;  Surgeon: Evans Lance, MD;  Location: [facility] CATH LAB;  Service: Cardiovascular;  Laterality: N/A;  . varicose vein injection in left lower leg      FAMILY HISTORY:  Family History  Problem Relation Age of Onset  . Stroke Mother   . Diabetes Mother   . Heart attack Father   . Hypertension Father     SOCIAL HISTORY:  Social History   Socioeconomic History  . Marital status: Single    Spouse name: Not on file  . Number of children: Not on file  . Years of education: Not on file  . Highest education level:  Not on file  Occupational History  . Not on file  Tobacco Use  . Smoking status: Former Smoker    Packs/day: 0.50    Years: 25.00    Pack years: 12.50    Types: Cigarettes    Quit date: 05/01/2014    Years since quitting: 5.1  . Smokeless tobacco: Never Used  . Tobacco comment: 5  cigarettes a day.  Substance and Sexual Activity  . Alcohol use: Yes    Alcohol/week: 8.0 - 10.0 standard drinks    Types: 1 Cans of beer, 7 - 9 Standard drinks or equivalent per week    Comment: one beer a day  . Drug use: No  . Sexual activity: Not on file  Other Topics Concern  . Not on file  Social History Narrative  . Not on file   Social Determinants of Health   Financial Resource Strain:   . Difficulty of Paying Living Expenses: Not on file  Food Insecurity:   . Worried About Charity fundraiser in the Last Year: Not on file  . Ran Out of Food in the Last Year: Not on file  Transportation Needs:   . Lack of Transportation (Medical): Not on file  . Lack of Transportation (Non-Medical): Not on file  Physical Activity:   . Days of Exercise per Week: Not on file  . Minutes of Exercise per Session: Not on file  Stress:   . Feeling of Stress : Not on file  Social Connections:   . Frequency of Communication with Friends and Family: Not on file  . Frequency of Social Gatherings with Friends and Family: Not on file  . Attends Religious Services: Not on file  . Active Member of Clubs or Organizations: Not on file  . Attends Archivist Meetings: Not on file  . Marital Status: Not on file    ALLERGIES:  Allergies  Allergen Reactions  . Latex Itching    MEDICATIONS:  Current Outpatient Medications  Medication Sig Dispense Refill  . Calcium Carbonate-Vit D-Min (CALCIUM 1200) 1200-1000 MG-UNIT CHEW Chew 1 tablet by mouth daily.    Marland Kitchen denosumab (PROLIA) 60 MG/ML SOLN injection Inject 60 mg into the skin every 6 (six) months. Administer in upper arm, thigh, or abdomen    . Multiple Vitamin (MULTIVITAMIN) tablet Take 1 tablet by mouth daily.    . valACYclovir (VALTREX) 500 MG tablet Take 500 mg by mouth 2 (two) times daily as needed. For outbreak    . vitamin B-12 (CYANOCOBALAMIN) 1000 MCG tablet Take 1,000 mcg by mouth daily.    . Vitamin D, Cholecalciferol, 400  units CAPS Take 800 Units by mouth.     No current facility-administered medications for this encounter.    REVIEW OF SYSTEMS: A 10+ POINT REVIEW OF SYSTEMS WAS OBTAINED including neurology, dermatology, psychiatry, cardiac, respiratory, lymph, extremities, GI, GU, musculoskeletal, constitutional, reproductive, HEENT. On the provided form, she reports wearing glasses and history of skin cancer. She denies fever, chills, nausea, vomiting, chest pain, shortness of breath, cough, abdominal pain, breast pain, and any other symptoms.    PHYSICAL EXAM:   Vitals with BMI 06/18/2019  Height '5\' 6"'   Weight 122 lbs 3 oz  BMI 66.44  Systolic 034  Diastolic 82  Pulse 56    Lungs are clear to auscultation bilaterally. Heart has regular rate and rhythm. No palpable cervical, supraclavicular, or axillary adenopathy. Abdomen soft, non-tender, normal bowel sounds. Left breast with no palpable mass, nipple discharge, or bleeding.  Right breast with bruising and associated induration in the upper outer quadrant. No nipple discharge or bleeding.   KPS = 100  100 - Normal; no complaints; no evidence of disease. 90   - Able to carry on normal activity; minor signs or symptoms of disease. 80   - Normal activity with effort; some signs or symptoms of disease. 75   - Cares for self; unable to carry on normal activity or to do active work. 60   - Requires occasional assistance, but is able to care for most of his personal needs. 50   - Requires considerable assistance and frequent medical care. 78   - Disabled; requires special care and assistance. 89   - Severely disabled; hospital admission is indicated although death not imminent. 59   - Very sick; hospital admission necessary; active supportive treatment necessary. 10   - Moribund; fatal processes progressing rapidly. 0     - Dead  Karnofsky DA, Abelmann [place], Craver LS and Burchenal JH [phone]) The use of the nitrogen mustards in the palliative treatment of  carcinoma: with particular reference to bronchogenic carcinoma Cancer 1 [id number]  LABORATORY DATA:  Lab Results  Component Value Date   WBC 5.2 06/18/2019   HGB 15.3 (H) 06/18/2019   HCT 45.4 06/18/2019   MCV 97.6 06/18/2019   PLT 225 06/18/2019   Lab Results  Component Value Date   NA 141 06/18/2019   K 4.8 06/18/2019   CL 107 06/18/2019   CO2 26 06/18/2019   Lab Results  Component Value Date   ALT 26 06/18/2019   AST 28 06/18/2019   ALKPHOS 75 06/18/2019   BILITOT 0.6 06/18/2019    PULMONARY FUNCTION TEST:   Recent Review Flowsheet Data    There is no flowsheet data to display.      RADIOGRAPHY: US BREAST LTD UNI RIGHT INC AXILLA  Result Date: 06/03/2019 CLINICAL DATA:  Screening recall for a possible right breast mass. EXAM: DIGITAL DIAGNOSTIC RIGHT MAMMOGRAM WITH CAD AND TOMO ULTRASOUND RIGHT BREAST COMPARISON:  Previous exam(s). ACR Breast Density Category d: The breast tissue is extremely dense, which lowers the sensitivity of mammography. FINDINGS: Spot compression tomosynthesis images through the upper outer posterior right breast demonstrates a possible obscured mass. Mammographic images were processed with CAD. On physical exam, there is a firm small palpable lump in the upper-outer quadrant of the right breast. Ultrasound targeted to the right breast at 10 30, 5 cm from the nipple demonstrates an ill-defined irregular mass with indistinct margins measuring 1.0 x 0.7 x 1.0 cm. Normal lymph nodes are seen in the right axilla. IMPRESSION: 1.  There is a suspicious mass in the right breast at 10 o'clock. 2.  No evidence of right axillary lymphadenopathy. RECOMMENDATION: Ultrasound guided biopsy is recommended for the right breast mass. This has been scheduled for 06/11/2019 at 1:45 p.m. I have discussed the findings and recommendations with the patient. If applicable, a reminder letter will be sent to the patient regarding the next appointment. BI-RADS CATEGORY  5: Highly  suggestive of malignancy. Electronically Signed   By: Ammie Ferrier M.D.   On: 06/03/2019 16:02   MM DIAG BREAST TOMO UNI RIGHT  Result Date: 06/03/2019 CLINICAL DATA:  Screening recall for a possible right breast mass. EXAM: DIGITAL DIAGNOSTIC RIGHT MAMMOGRAM WITH CAD AND TOMO ULTRASOUND RIGHT BREAST COMPARISON:  Previous exam(s). ACR Breast Density Category d: The breast tissue is extremely dense, which lowers the sensitivity of mammography. FINDINGS: Spot compression tomosynthesis  images through the upper outer posterior right breast demonstrates a possible obscured mass. Mammographic images were processed with CAD. On physical exam, there is a firm small palpable lump in the upper-outer quadrant of the right breast. Ultrasound targeted to the right breast at 10 30, 5 cm from the nipple demonstrates an ill-defined irregular mass with indistinct margins measuring 1.0 x 0.7 x 1.0 cm. Normal lymph nodes are seen in the right axilla. IMPRESSION: 1.  There is a suspicious mass in the right breast at 10 o'clock. 2.  No evidence of right axillary lymphadenopathy. RECOMMENDATION: Ultrasound guided biopsy is recommended for the right breast mass. This has been scheduled for 06/11/2019 at 1:45 p.m. I have discussed the findings and recommendations with the patient. If applicable, a reminder letter will be sent to the patient regarding the next appointment. BI-RADS CATEGORY  5: Highly suggestive of malignancy. Electronically Signed   By: Ammie Ferrier M.D.   On: 06/03/2019 16:02   MM 3D SCREEN BREAST BILATERAL  Result Date: 05/23/2019 CLINICAL DATA:  Screening. EXAM: DIGITAL SCREENING BILATERAL MAMMOGRAM WITH TOMO AND CAD COMPARISON:  Previous exam(s). ACR Breast Density Category d: The breast tissue is extremely dense, which lowers the sensitivity of mammography. FINDINGS: In the right breast, a possible mass warrants further evaluation. In the left breast, no findings suspicious for malignancy. Images were  processed with CAD. IMPRESSION: Further evaluation is suggested for possible mass in the right breast. RECOMMENDATION: Diagnostic mammogram and possibly ultrasound of the right breast. (Code:[id number]) The patient will be contacted regarding the findings, and additional imaging will be scheduled. BI-RADS CATEGORY  0: Incomplete. Need additional imaging evaluation and/or prior mammograms for comparison. Electronically Signed   By: Curlene Dolphin M.D.   On: 05/23/2019 07:50   MM CLIP PLACEMENT RIGHT  Result Date: 06/11/2019 CLINICAL DATA:  Patient with indeterminate right breast mass. EXAM: DIAGNOSTIC RIGHT MAMMOGRAM POST ULTRASOUND BIOPSY COMPARISON:  Previous exam(s). FINDINGS: Mammographic images were obtained following ultrasound guided biopsy of right breast mass 10:30 o'clock. The biopsy marking clip is in expected position at the site of biopsy. IMPRESSION: Appropriate positioning of the ribbon shaped biopsy marking clip at the site of biopsy in the right breast mass 10:30 o'clock. Final Assessment: Post Procedure Mammograms for Marker Placement Electronically Signed   By: Lovey Newcomer M.D.   On: 06/11/2019 14:36   [place] RT BREAST BX W LOC DEV 1ST LESION IMG BX SPEC US GUIDE  Addendum Date: 06/12/2019   ADDENDUM REPORT: 06/12/2019 13:08 ADDENDUM: Pathology revealed GRADE I-II INVASIVE MAMMARY CARCINOMA of the RIGHT Breast, 10:30 o'clock. This was found to be concordant by Dr. Lovey Newcomer. Pathology results were discussed with the patient by telephone. The patient reported doing well after the biopsy with tenderness at the site. Post biopsy instructions and care were reviewed and questions were answered. The patient was encouraged to call [facility] for any additional concerns. The patient was referred to [facility] on June 18, 2019. Pathology results reported by Stacie Acres RN on 06/12/2019.  Electronically Signed   By: Lovey Newcomer M.D.   On: 06/12/2019 13:08   Result Date: 06/12/2019 CLINICAL DATA:  Patient with indeterminate right breast mass 10:30 o'clock. EXAM: ULTRASOUND GUIDED RIGHT BREAST CORE NEEDLE BIOPSY COMPARISON:  Previous exam(s). FINDINGS: I met with the patient and we discussed the procedure of ultrasound-guided biopsy, including benefits and alternatives. We discussed the high likelihood of a  successful procedure. We discussed the risks of the procedure, including infection, bleeding, tissue injury, clip migration, and inadequate sampling. Informed written consent was given. The usual time-out protocol was performed immediately prior to the procedure. Lesion quadrant: Upper outer quadrant Using sterile technique and 1% Lidocaine as local anesthetic, under direct ultrasound visualization, a 14 gauge spring-loaded device was used to perform biopsy of right breast mass 10:30 o'clock using a lateral approach. At the conclusion of the procedure ribbon shaped tissue marker clip was deployed into the biopsy cavity. Follow up 2 view mammogram was performed and dictated separately. IMPRESSION: Ultrasound guided biopsy of right breast mass 10:30 o'clock. No apparent complications. Electronically Signed: By: Lovey Newcomer M.D. On: 06/11/2019 14:35      IMPRESSION: Clinical Stage T1b, N0, Right Breast UOQ, Invasive Ductal Carcinoma, ER+ / PR+ / Her2-, Grade 1-2  Patient will be a good candidate for breast conservation with radiotherapy to right breast. She will proceed with MRI for further evaluation given her breast density. She does seem interested in breast conserving treatment unless MRI shows new problems. We discussed the general course of radiation, potential side effects, and toxicities with radiation and the patient is interested in this approach.   PLAN:  1. MRI 2. Right lumpectomy with sentinel node biopsy 3. Oncoptype 4. Adjuvant radiation therapy 5. Aromatase inhibitor     ------------------------------------------------  Blair Promise, PhD, MD  This document serves as a record of services personally performed by Gery Pray, MD. It was created on his behalf by Clerance Lav, a trained medical scribe. The creation of this record is based on the scribe's personal observations and the provider's statements to them. This document has been checked and approved by the attending provider.

## 2019-06-18 NOTE — Therapy (Signed)
[place], [place], [address] Phone: [phone]   Fax:  [phone]  Physical Therapy Evaluation  Patient Details  Name: Carrie Serrano MRN: [id number] Date of Birth: 1951-04-21 Referring Provider (PT): Dr. Stark Klein   Encounter Date: 06/18/2019  PT End of Session - 06/18/19 1252    Visit Number  1    Number of Visits  2    Date for PT Re-Evaluation  08/13/19    PT Start Time  1540    PT Stop Time  1102    PT Time Calculation (min)  22 min    Activity Tolerance  Patient tolerated treatment well    Behavior During Therapy  [facility] for tasks assessed/performed       Past Medical History:  Diagnosis Date  . HSV-2 (herpes simplex virus 2) infection   . Osteopenia   . PAT (paroxysmal atrial tachycardia) ([place])   . SVT (supraventricular tachycardia) ([facility])     Past Surgical History:  Procedure Laterality Date  . ABDOMINAL HYSTERECTOMY    . ABLATION OF DYSRHYTHMIC FOCUS  06/13/2012   SVT    Dr  Lovena Le  . COLONOSCOPY  2012  . COLONOSCOPY WITH PROPOFOL N/A 06/26/2016   Procedure: COLONOSCOPY WITH PROPOFOL;  Surgeon: Lollie Sails, MD;  Location: [facility] ENDOSCOPY;  Service: Endoscopy;  Laterality: N/A;  . ELECTROPHYSIOLOGY STUDY N/A 06/13/2012   Procedure: ELECTROPHYSIOLOGY STUDY;  Surgeon: Evans Lance, MD;  Location: [facility] CATH LAB;  Service: Cardiovascular;  Laterality: N/A;  . FLEXIBLE SIGMOIDOSCOPY N/A 01/24/2017   Procedure: FLEXIBLE SIGMOIDOSCOPY;  Surgeon: Toledo, Benay Pike, MD;  Location: [facility] ENDOSCOPY;  Service: Gastroenterology;  Laterality: N/A;  . SUPRAVENTRICULAR TACHYCARDIA ABLATION N/A 06/13/2012   Procedure: SUPRAVENTRICULAR TACHYCARDIA ABLATION;  Surgeon: Evans Lance, MD;  Location: [facility] CATH LAB;  Service: Cardiovascular;  Laterality: N/A;  . varicose vein injection in left lower leg      There were no vitals filed for this visit.   Subjective Assessment - 06/18/19 1242    Subjective  Patient reports she is here today to be seen by her medical team for her newly diagnosed right breast cancer.    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 It measures 1 cm and is located in the upper outer quadrant. It is ER/PR positive and HER2 negative with a Ki67 of 15%. She has chronic edema in her right fingers, worst in middle finger for unknown reasons.    Patient Stated Goals  Reduce lymphedema risk and learn post op shoulder ROM HEP    Currently in Pain?  No/denies         [facility] PT Assessment - 06/18/19 0001      Assessment   Medical Diagnosis  Right breast cancer    Referring Provider (PT)  Dr. Stark Klein    Onset Date/Surgical Date  06/12/19    Hand Dominance  Right    Prior Therapy  none      Precautions   Precautions  Other (comment)    Precaution Comments  Active cancer      Restrictions   Weight Bearing Restrictions  No      Balance Screen   Has the patient fallen in the past 6 months  No    Has the patient had a decrease in activity level because of a fear of falling?   No    Is the patient reluctant to  leave their home because of a fear of falling?   No      Home Environment   Living Environment  Private residence    Living Arrangements  Alone    Available Help at Discharge  Friend(s)      Prior Function   Level of [place]  Retired    Leisure  She does boot camp 3x/week and walks/runs daily for 1 hour      Cognition   Overall Cognitive Status  Within Functional Limits for tasks assessed      Posture/Postural Control   Posture/Postural Control  Postural limitations    Postural Limitations  Rounded Shoulders;Forward head      ROM / Strength   AROM / PROM / Strength  AROM;Strength      AROM   AROM Assessment Site  Shoulder;Cervical    Right/Left Shoulder  Right;Left    Right Shoulder Extension  55 Degrees    Right Shoulder Flexion   150 Degrees    Right Shoulder ABduction  155 Degrees    Right Shoulder Internal Rotation  67 Degrees    Right Shoulder External Rotation  81 Degrees    Left Shoulder Extension  64 Degrees    Left Shoulder Flexion  151 Degrees    Left Shoulder ABduction  156 Degrees    Left Shoulder Internal Rotation  75 Degrees    Left Shoulder External Rotation  85 Degrees    Cervical Flexion  WNL    Cervical Extension  WNL    Cervical - Right Side Bend  25% limited    Cervical - Left Side Bend  25% limited    Cervical - Right Rotation  25% limited    Cervical - Left Rotation  25% limited      Strength   Overall Strength  Within functional limits for tasks performed        LYMPHEDEMA/ONCOLOGY QUESTIONNAIRE - 06/18/19 1250      Type   Cancer Type  Right breast cancer      Lymphedema Assessments   Lymphedema Assessments  Upper extremities      Right Upper Extremity Lymphedema   10 cm Proximal to Olecranon Process  25.1 cm    Olecranon Process  24.5 cm    10 cm Proximal to Ulnar Styloid Process  22.2 cm    Just Proximal to Ulnar Styloid Process  16.2 cm    Across Hand at [employer]  19.4 cm    At [place] of 2nd Digit  6.4 cm      Left Upper Extremity Lymphedema   10 cm Proximal to Olecranon Process  25 cm    Olecranon Process  23.2 cm    10 cm Proximal to Ulnar Styloid Process  21.3 cm    Just Proximal to Ulnar Styloid Process  16 cm    Across Hand at [employer]  18.1 cm    At [place] of 2nd Digit  6.2 cm          Quick Dash - 06/18/19 0001    Open a tight or new jar  No difficulty    Do heavy household chores (wash walls, wash floors)  No difficulty    Carry a shopping bag or briefcase  No difficulty    Wash your back  No difficulty    Use a knife to cut food  No difficulty    Recreational activities in which you take some force  or impact through your arm, shoulder, or hand (golf, hammering, tennis)  No difficulty    During the past week, to what extent has your arm,  shoulder or hand problem interfered with your normal social activities with family, friends, neighbors, or groups?  Not at all    During the past week, to what extent has your arm, shoulder or hand problem limited your work or other regular daily activities  Not at all    Arm, shoulder, or hand pain.  None    Tingling (pins and needles) in your arm, shoulder, or hand  Mild    Difficulty Sleeping  No difficulty    DASH Score  2.27 %        Objective measurements completed on examination: See above findings.      Patient was instructed today in a home exercise program today for post op shoulder range of motion. These included active assist shoulder flexion in sitting, scapular retraction, wall walking with shoulder abduction, and hands behind head external rotation.  She was encouraged to do these twice a day, holding 3 seconds and repeating 5 times when permitted by her physician.        PT Education - 06/18/19 1251    Education Details  Lymphedema risk reduction and post op shoulder ROM HEP    Person(s) Educated  Patient    Methods  Explanation;Demonstration;Handout    Comprehension  Returned demonstration;Verbalized understanding          PT Long Term Goals - 06/18/19 1256      PT LONG TERM GOAL #1   Title  Patient will demonstrate she has regainde full shoulder ROM and function post operatively compared to baselines.    Time  8    Period  Weeks    Status  New    Target Date  08/13/19      Breast Clinic Goals - 06/18/19 1255      Patient will be able to verbalize understanding of pertinent lymphedema risk reduction practices relevant to her diagnosis specifically related to skin care.   Time  1    Period  Days    Status  Achieved      Patient will be able to return demonstrate and/or verbalize understanding of the post-op home exercise program related to regaining shoulder range of motion.   Time  1    Period  Days    Status  Achieved      Patient will be able  to verbalize understanding of the importance of attending the postoperative After Breast Cancer Class for further lymphedema risk reduction education and therapeutic exercise.   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 It measures 1 cm and is located in the upper outer quadrant. It is ER/PR positive and HER2 negative with a Ki67 of 15%. She has chronic edema in her right fingers, worst in middle finger for unknown reasons. Her multidisciplinary medical team met prior to her assessments to determine a recommended treatment plan. She is planning to have a right lumpectomy and sentinel node biopsy followed by Oncotype testing, radiation, and anti-estrogen therapy. She will benefit from a post op PT reassessment to determine needs.    Stability/Clinical Decision Making  Stable/Uncomplicated  Clinical Decision Making  Low    Rehab Potential  Excellent    PT Frequency  --   Eval and 1 f/u visit   PT Treatment/Interventions  ADLs/Self Care Home Management;Therapeutic exercise;Patient/family education    PT Next Visit Plan  Will reassess and determine needs 3-4 weeks post op    PT Home Exercise Plan  Post op shoulder ROM HEP    Consulted and Agree with Plan of Care  Patient       Patient will benefit from skilled therapeutic intervention in order to improve the following deficits and impairments:  Postural dysfunction, Decreased range of motion, Pain, Impaired UE functional use, Decreased knowledge of precautions  Visit Diagnosis: Malignant neoplasm of upper-outer quadrant of right breast in female, estrogen receptor positive ([place]) - Plan: PT plan of care cert/re-cert  Abnormal posture - Plan: PT plan of care cert/re-cert   Patient will follow up at outpatient cancer rehab 3-4 weeks following surgery.  If the patient requires  physical therapy at that time, a specific plan will be dictated and sent to the referring physician for approval. The patient was educated today on appropriate basic range of motion exercises to begin post operatively and the importance of attending the After Breast Cancer class following surgery.  Patient was educated today on lymphedema risk reduction practices as it pertains to recommendations that will benefit the patient immediately following surgery.  She verbalized good understanding.      Problem List Patient Active Problem List   Diagnosis Date Noted  . Malignant neoplasm of upper-outer quadrant of right breast in female, estrogen receptor positive ([place]) 06/13/2019  . Varicose veins of both lower extremities with complications 27/09/2374  . Chronic venous insufficiency 06/30/2016  . PSVT (paroxysmal supraventricular tachycardia) ([place]) 09/18/2014  . Right bundle branch block 09/18/2014  . RBBB 09/05/2013  . LAFB (left anterior fascicular block) 09/05/2013  . SVT (supraventricular tachycardia) ([place]) 12/08/2011  . Tobacco abuse 12/08/2011    Annia Friendly, PT 06/18/19 12:58 PM   [place], [place], [address] Phone: [phone]   Fax:  [phone]  Name: Carrie Serrano MRN: [id number] Date of Birth: October 03, 1950

## 2019-06-18 NOTE — Progress Notes (Signed)
MRI

## 2019-06-25 ENCOUNTER — Other Ambulatory Visit: Payer: Self-pay

## 2019-06-25 ENCOUNTER — Ambulatory Visit (HOSPITAL_COMMUNITY)
Admission: RE | Admit: 2019-06-25 | Discharge: 2019-06-25 | Disposition: A | Discharge status: Home / Self Care | Payer: Medicare Other | Source: Ambulatory Visit | Attending: General Surgery | Admitting: General Surgery

## 2019-06-25 ENCOUNTER — Other Ambulatory Visit: Payer: Self-pay | Admitting: *Deleted

## 2019-06-25 ENCOUNTER — Other Ambulatory Visit: Payer: Self-pay | Admitting: General Surgery

## 2019-06-25 ENCOUNTER — Encounter (HOSPITAL_BASED_OUTPATIENT_CLINIC_OR_DEPARTMENT_OTHER): Payer: Self-pay | Admitting: General Surgery

## 2019-06-25 DIAGNOSIS — C50411 Malignant neoplasm of upper-outer quadrant of right female breast: Secondary | ICD-10-CM

## 2019-06-25 DIAGNOSIS — Z170 Estrogen receptor positive status [ER+]: Secondary | ICD-10-CM | POA: Diagnosis present

## 2019-06-25 IMAGING — MR MR BREAST BILAT WO/W CM
7 of 10 series · 29 of 48 positions shown · IV contrast (6ml GADAVIST)
Comparison: Previous exam(s).

CLINICAL DATA: 68-year-old female with newly diagnosed right breast
cancer.

LABS:  None performed on site.
EXAM:
BILATERAL BREAST MRI WITH AND WITHOUT CONTRAST
TECHNIQUE: Multiplanar, multisequence MR images of both breasts were obtained
prior to and following the intravenous administration of 6 ml of
Gadavist.

[Series 2: T2 · axial · 3.0mm · 0.83mm/px · z∈[-81,+81]mm · 2 of 54 slices shown]
[im 1/54]
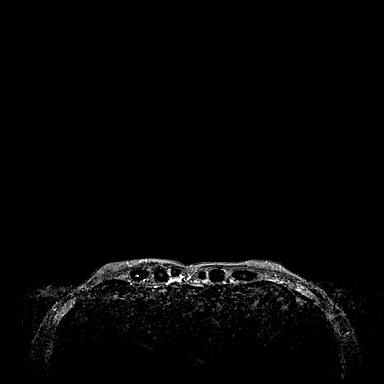
[im 54/54]
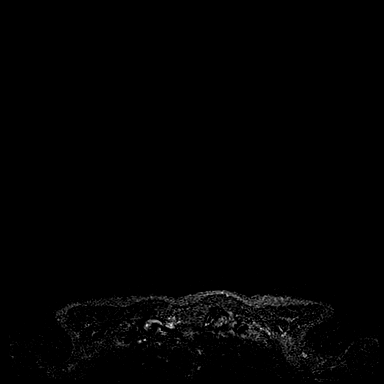

[Series 3: T1 fat-sat · axial · 1.2mm · 0.71mm/px · z∈[-71,+71]mm · 5 of 120 slices shown (1 of 4)]
[im 1/120]
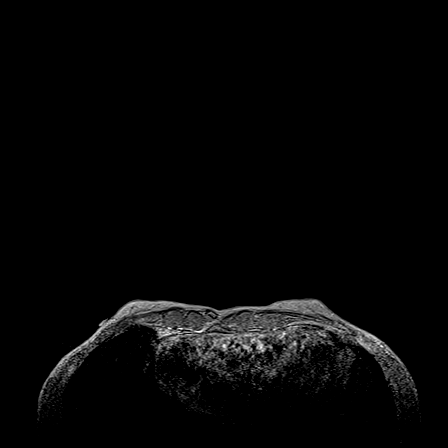
[im 30/120]
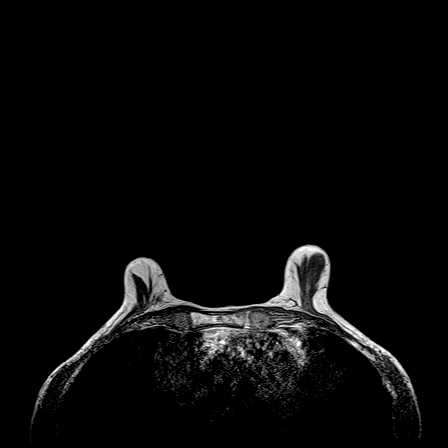
[im 60/120]
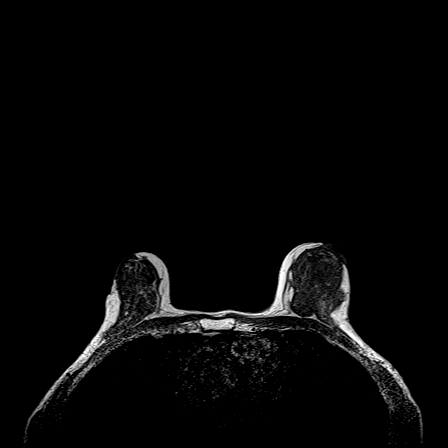
[im 90/120]
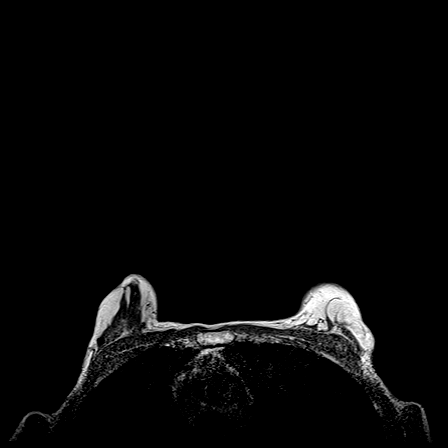
[im 120/120]
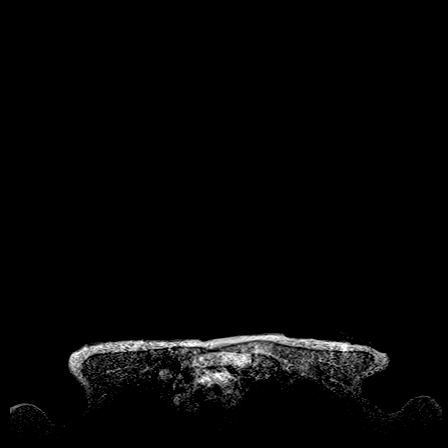

[Series 5: T1 fat-sat · axial · 1.6mm · 0.77mm/px · z∈[-89,+89]mm · 5 of 112 slices shown (2 of 4)]
[im 1/112]
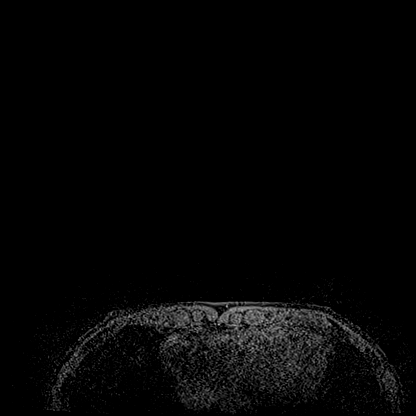
[im 28/112]
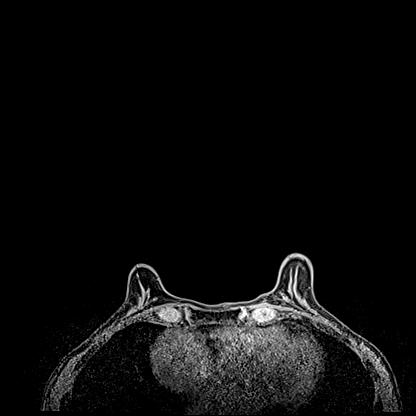
[im 56/112]
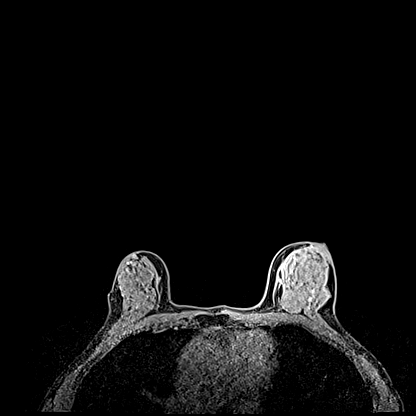
[im 84/112]
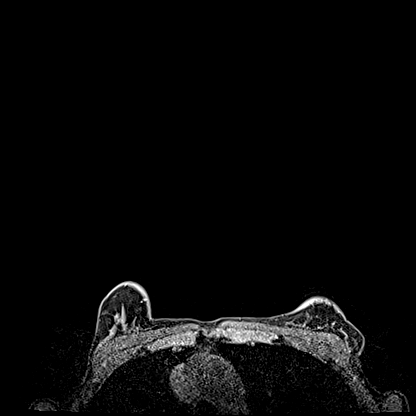
[im 112/112]
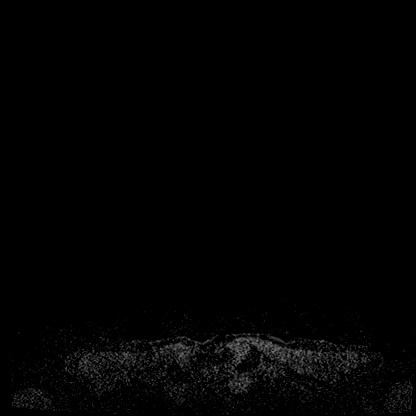

[Series 6: T1 fat-sat · axial · 1.6mm · 0.77mm/px · z∈[-89,+89]mm · 5 of 112 slices shown (3 of 4)]
[im 1/112]
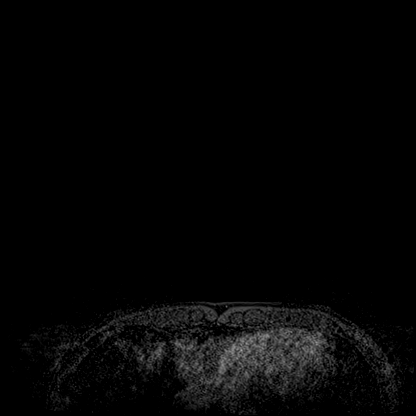
[im 28/112]
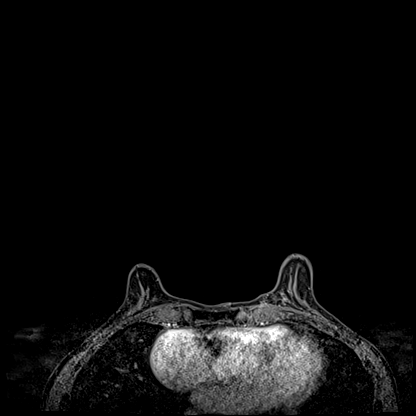
[im 56/112]
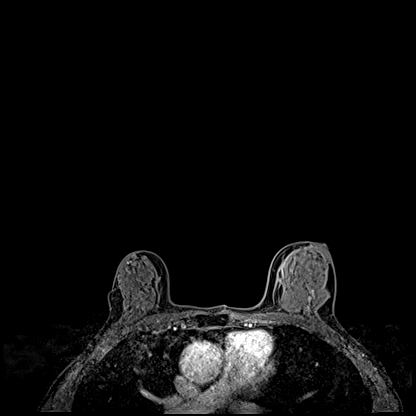
[im 84/112]
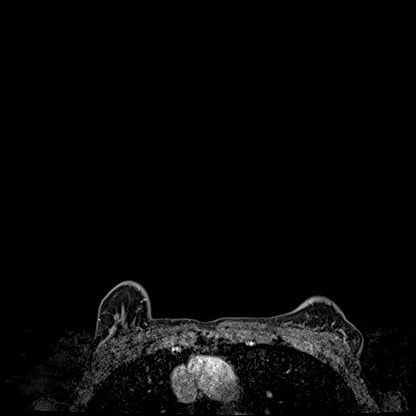
[im 112/112]
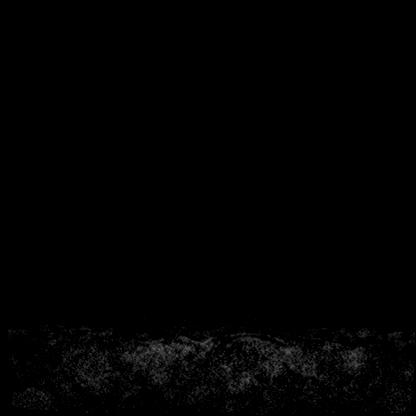

[Series 7: T1 · axial · 1.6mm · 0.77mm/px · z∈[-89,+89]mm · 6 of 112 slices shown (1 of 2)]
[im 1/112]
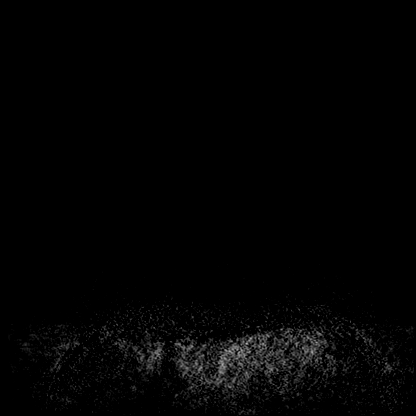
[im 23/112]
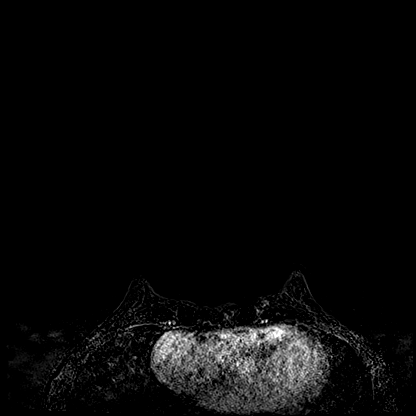
[im 45/112]
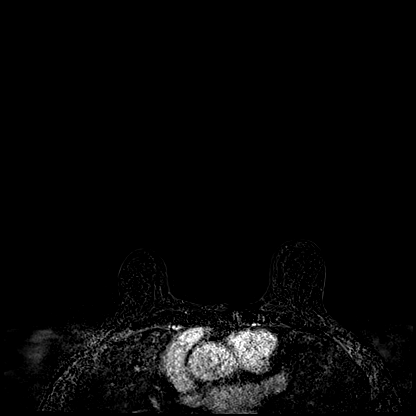
[im 67/112]
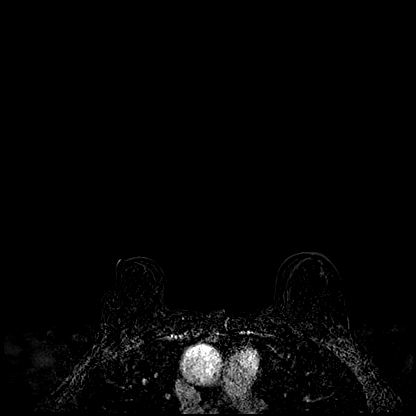
[im 89/112]
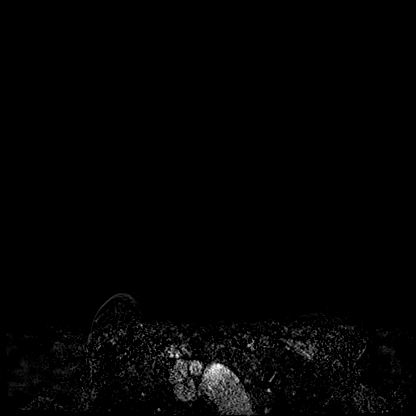
[im 112/112]
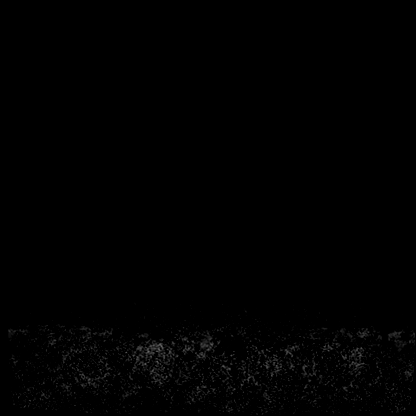

[Series 9: T1 · axial · 179.2mm · 0.77mm/px · 1 of 3 slices shown (2 of 2)]
[im 1/3]
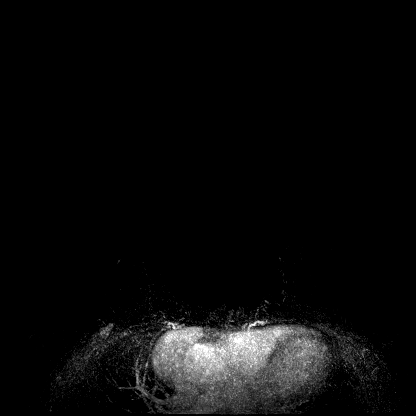

[Series 10: T1 fat-sat · axial · 1.6mm · 0.77mm/px · z∈[-89,+52]mm · 5 of 112 slices shown (4 of 4)]
[im 1/112]
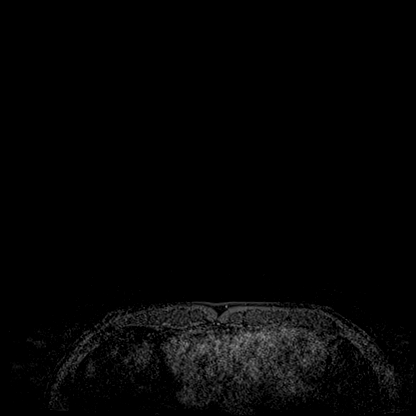
[im 23/112]
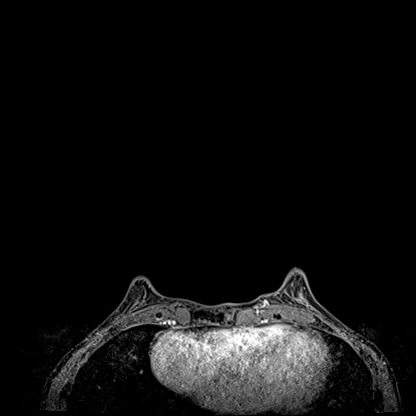
[im 45/112]
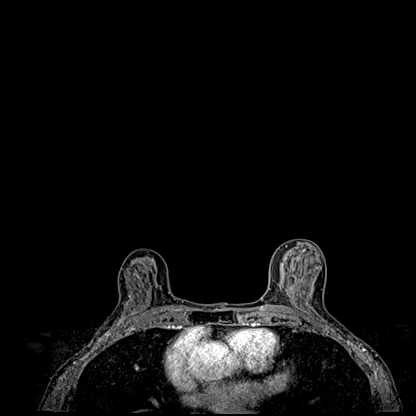
[im 67/112]
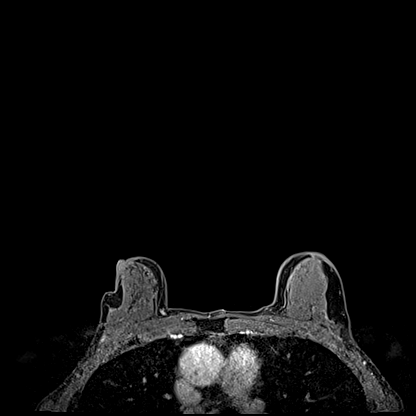
[im 89/112]
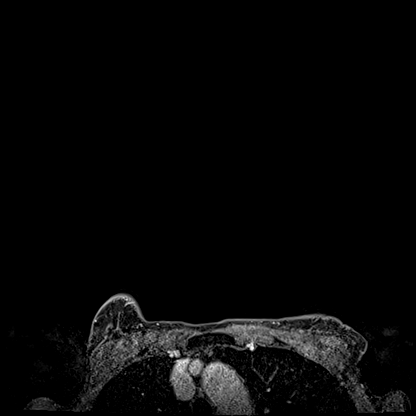

[29 of 48 positions shown; findings below may reference images not displayed]

Three-dimensional MR images were rendered by post-processing of the
original MR data on an independent workstation. The
three-dimensional MR images were interpreted, and findings are
reported in the following complete MRI report for this study. Three
dimensional images were evaluated at the independent [employer]
workstation
FINDINGS: Breast composition: d. Extreme fibroglandular tissue.

Background parenchymal enhancement: Minimal.

Right breast: Susceptibility artifact from post biopsy clip is seen
in association with enhancing mass in the far posterior, upper-outer
right breast (series 6, image 39/112). This is consistent with the
patient's biopsy-proven malignancy. It measures 1.3 x 1.0 x 1.1 cm
(AP by transverse by craniocaudal dimensions).

No additional suspicious mass or non-mass enhancement is seen in the
remainder of the right breast. Post biopsy clip from prior remote
biopsy is seen in the lateral aspect at middle depth.

Left breast: No suspicious mass or non-mass enhancement.

Lymph nodes: No abnormal appearing lymph nodes.

Ancillary findings:  None.
IMPRESSION: 1. 1.3 cm enhancing mass in the far posterior, upper-outer right
breast corresponding with the patient's biopsy-proven site of
malignancy. No additional suspicious enhancement in the remainder of
the right breast.
2. No MRI evidence of malignancy on the left.
3. No suspicious lymphadenopathy.

RECOMMENDATION:
Per clinical treatment plan.

BI-RADS CATEGORY  6: Known biopsy-proven malignancy.

## 2019-06-25 MED ORDER — GADOBUTROL 1 MMOL/ML IV SOLN
6.0000 mL | Freq: Once | INTRAVENOUS | Status: AC | PRN
  Administered 2019-06-25: 6 mL via INTRAVENOUS

## 2019-06-26 ENCOUNTER — Telehealth: Payer: Self-pay | Admitting: *Deleted

## 2019-06-26 ENCOUNTER — Ambulatory Visit: Payer: Medicare Other | Attending: Internal Medicine

## 2019-06-26 ENCOUNTER — Telehealth: Payer: Self-pay | Admitting: Hematology and Oncology

## 2019-06-26 DIAGNOSIS — Z23 Encounter for immunization: Secondary | ICD-10-CM | POA: Insufficient documentation

## 2019-06-26 NOTE — Telephone Encounter (Signed)
Scheduled appt per 2/24 sch message - - pt aware of appt date and time   

## 2019-06-26 NOTE — Progress Notes (Signed)
   Covid-19 Vaccination Clinic  Name:  Carrie Serrano    MRN: [id number] DOB: 23-Nov-1950  06/26/2019  Carrie Serrano was observed post Covid-19 immunization for 15 minutes without incidence. She was provided with Vaccine Information Sheet and instruction to access the V-Safe system.   Carrie Serrano was instructed to call 911 with any severe reactions post vaccine: Marland Kitchen Difficulty breathing  . Swelling of your face and throat  . A fast heartbeat  . A bad rash all over your body  . Dizziness and weakness    Immunizations Administered    Name Date Dose VIS Date Route   Pfizer COVID-19 Vaccine 06/26/2019 10:04 AM 0.3 mL 04/11/2019 Intramuscular   Manufacturer: [place]   Lot: [id number]   [place]: [id number]

## 2019-06-26 NOTE — Telephone Encounter (Signed)
Spoke with patient to follow up from [facility] and assess navigation needs.  Patient states she is doing well and excited that her surgery is next week.  She is requesting that her radiation be done in [place] with Dr. Baruch Gouty because that will be closer for her.  We will make the referral after we receive her oncotype results after surgery.  Encouraged her to call with any other needs or concerns.

## 2019-06-27 ENCOUNTER — Other Ambulatory Visit (HOSPITAL_COMMUNITY)
Admission: RE | Admit: 2019-06-27 | Discharge: 2019-06-27 | Disposition: A | Discharge status: Home / Self Care | Payer: Medicare Other | Source: Ambulatory Visit | Attending: General Surgery | Admitting: General Surgery

## 2019-06-27 DIAGNOSIS — Z20822 Contact with and (suspected) exposure to covid-19: Secondary | ICD-10-CM | POA: Diagnosis not present

## 2019-06-27 DIAGNOSIS — Z01812 Encounter for preprocedural laboratory examination: Secondary | ICD-10-CM | POA: Insufficient documentation

## 2019-06-27 LAB — SARS CORONAVIRUS 2 (TAT 6-24 HRS): SARS Coronavirus 2: NEGATIVE

## 2019-06-27 MED ORDER — ENSURE PRE-SURGERY PO LIQD: 296.0000 mL | Freq: Once | ORAL | Status: DC

## 2019-06-27 NOTE — Progress Notes (Signed)

## 2019-06-30 ENCOUNTER — Other Ambulatory Visit: Payer: Self-pay | Admitting: General Surgery

## 2019-06-30 ENCOUNTER — Other Ambulatory Visit: Payer: Self-pay

## 2019-06-30 ENCOUNTER — Ambulatory Visit
Admission: RE | Admit: 2019-06-30 | Discharge: 2019-06-30 | Disposition: A | Discharge status: Home / Self Care | Payer: Medicare Other | Source: Ambulatory Visit | Attending: General Surgery | Admitting: General Surgery

## 2019-06-30 DIAGNOSIS — C50411 Malignant neoplasm of upper-outer quadrant of right female breast: Secondary | ICD-10-CM

## 2019-06-30 DIAGNOSIS — Z170 Estrogen receptor positive status [ER+]: Secondary | ICD-10-CM

## 2019-06-30 IMAGING — MG MM BREAST LOCALIZATION CLIP
4 series · 4 of 12 positions shown · non-contrast
Comparison: Previous exam(s).

CLINICAL DATA: Post ultrasound-guided right breast radioactive seed
placement prior to scheduled lumpectomy.

EXAM:
DIAGNOSTIC RIGHT MAMMOGRAM POST ULTRASOUND-GUIDED RADIOACTIVE SEED
PLACEMENT

[R ML synth-2D]
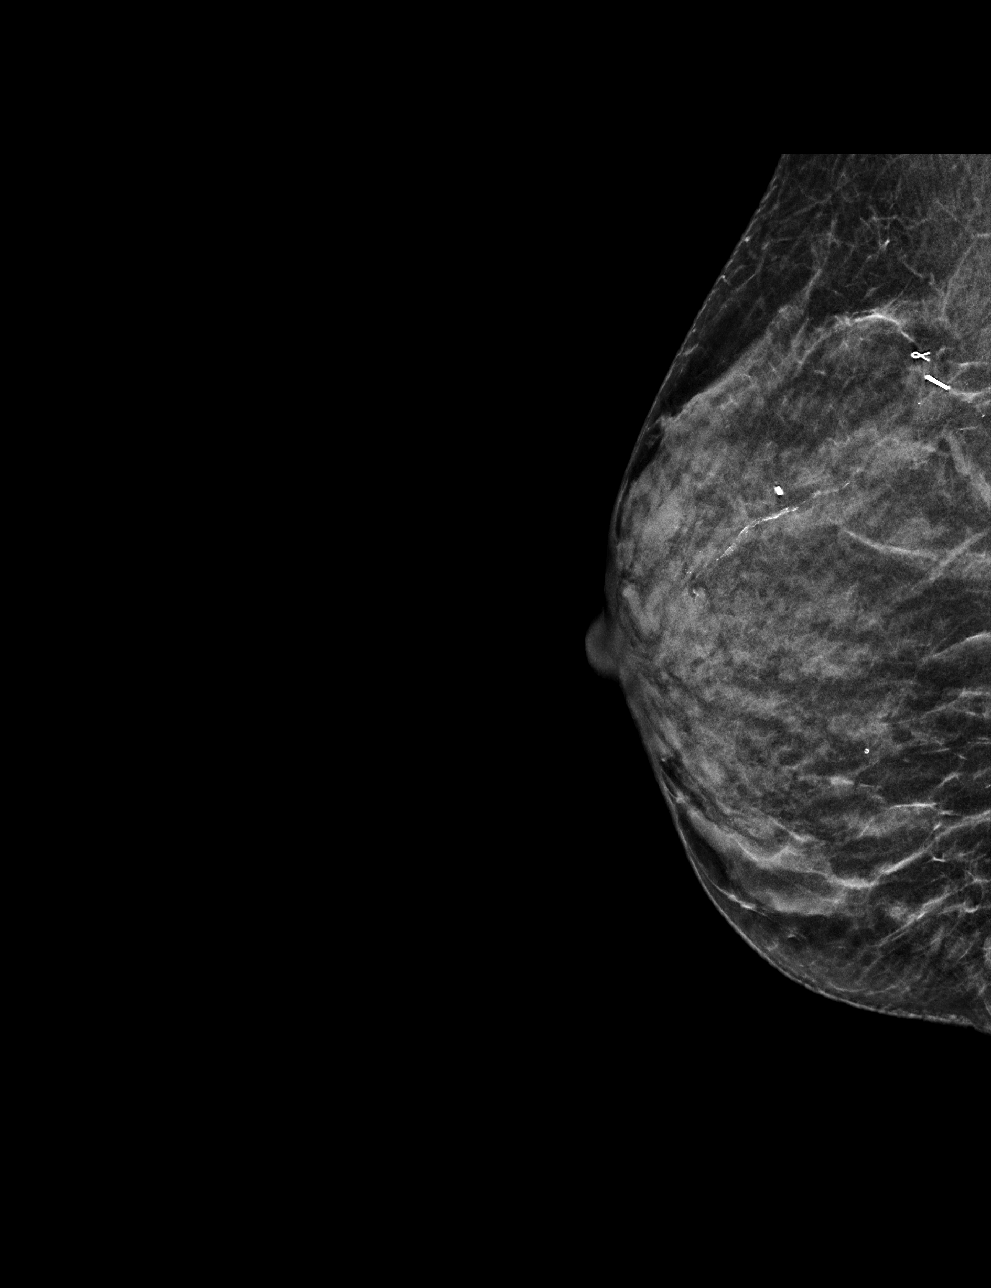

[R CC synth-2D]
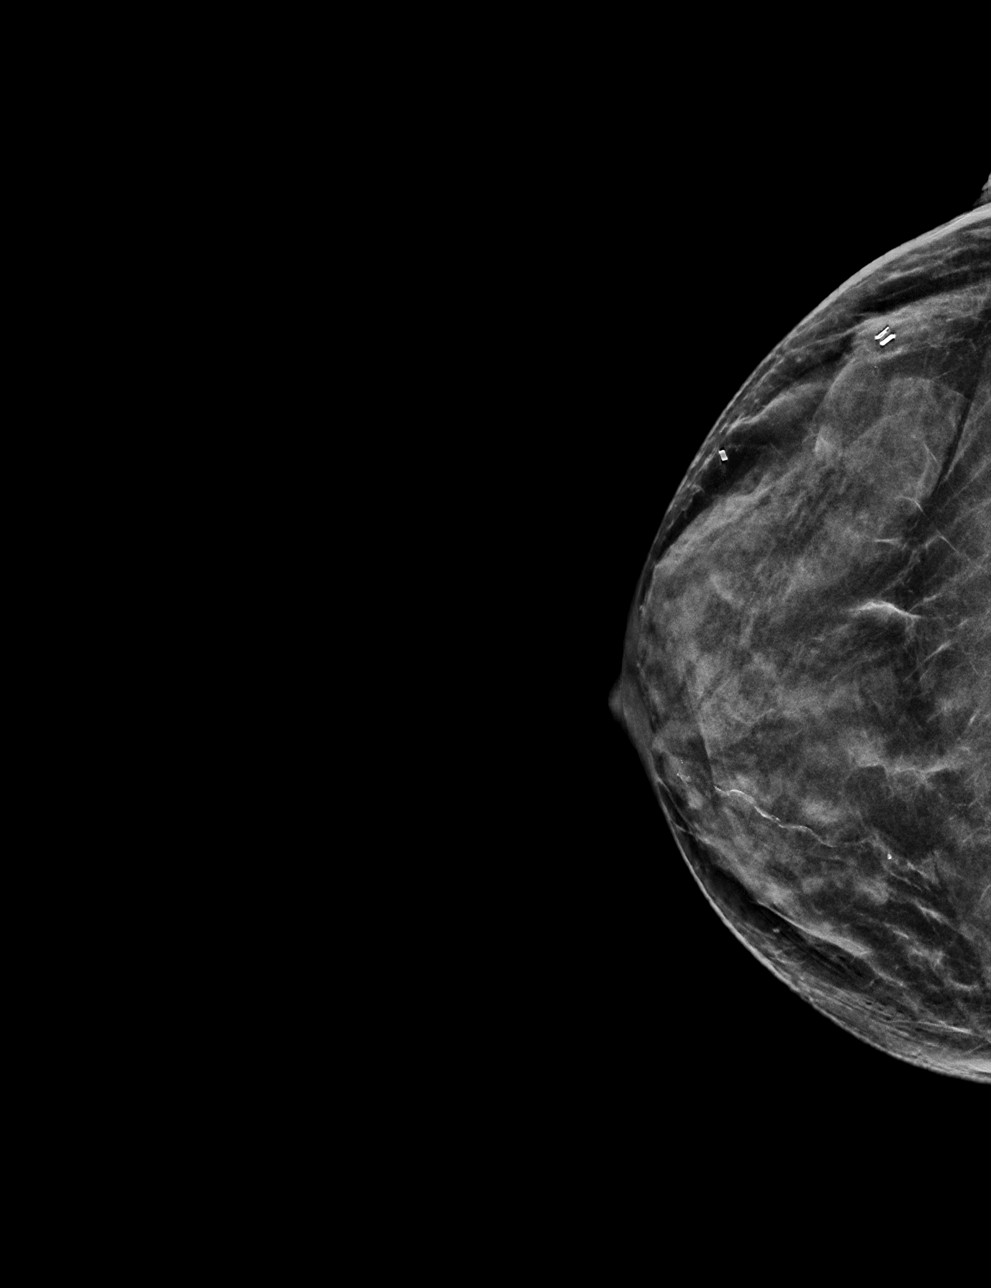

[R ML tomo · tomo slice 18/35.0]
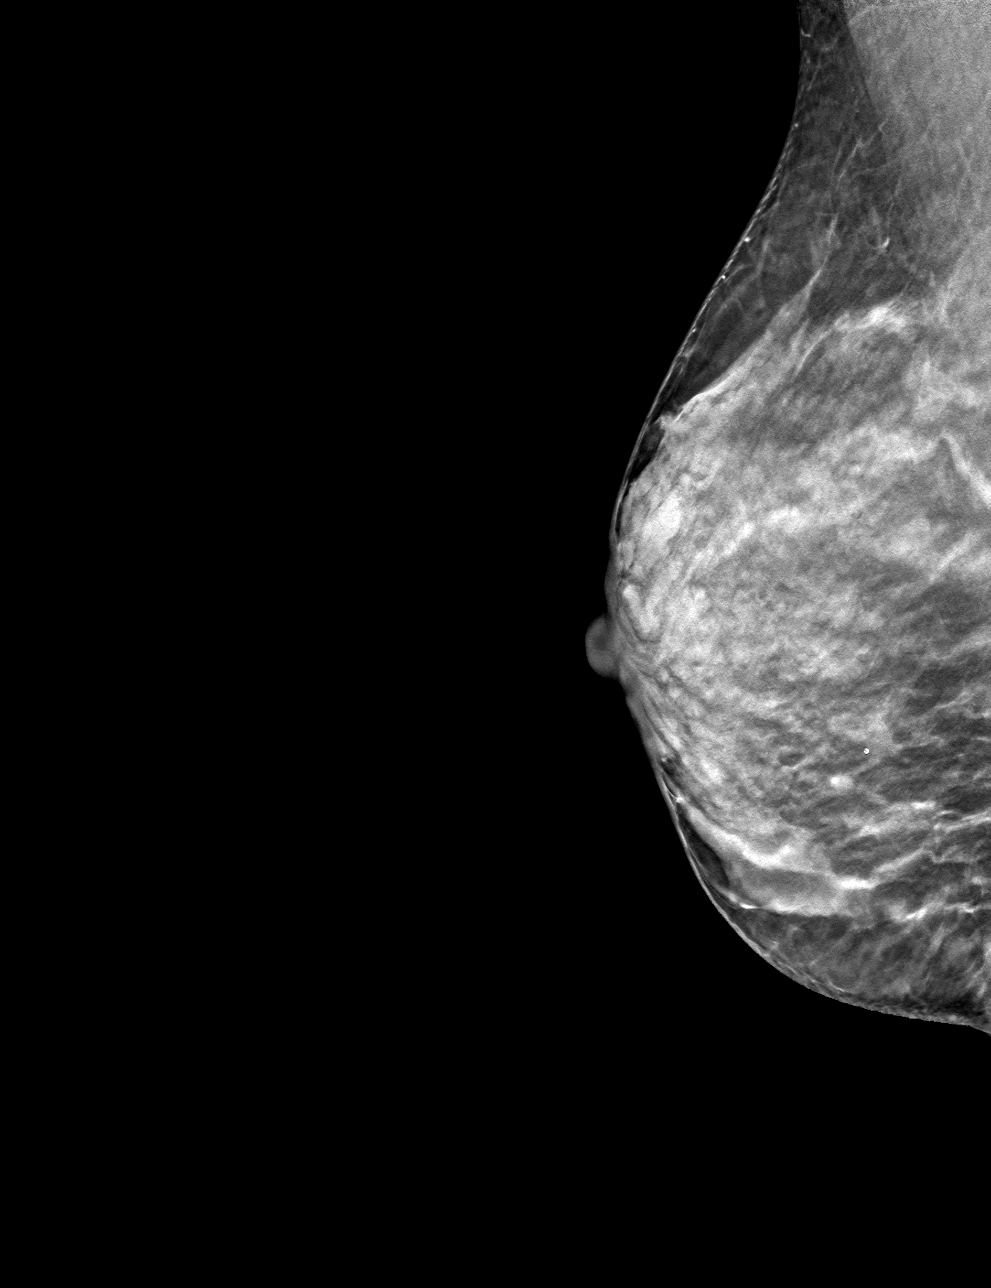

[R CC tomo · tomo slice 22/43.0]
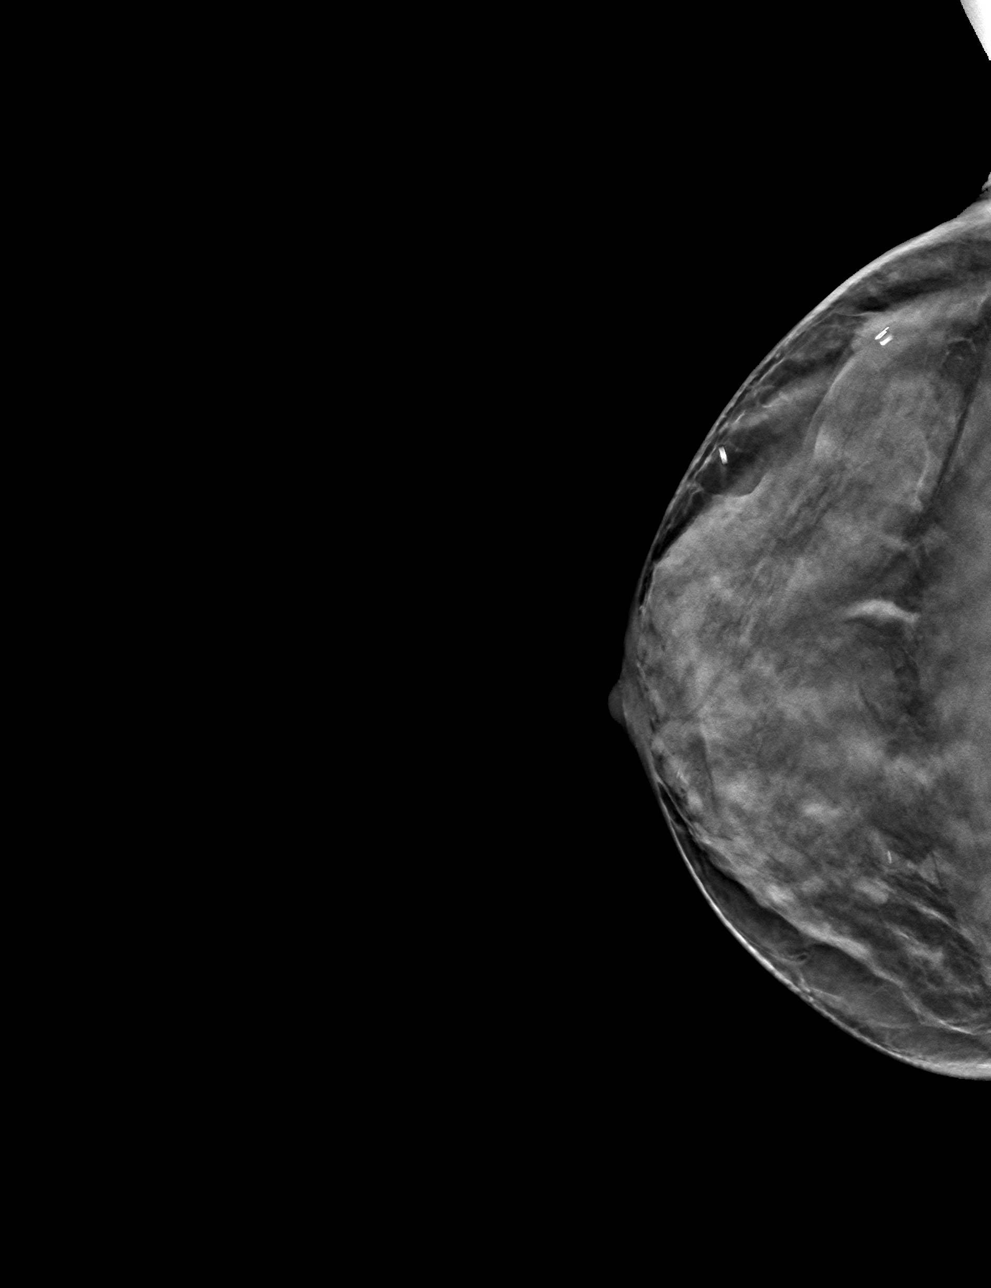

[4 of 12 positions shown; findings below may reference images not displayed]

FINDINGS: Mammographic images were obtained following ultrasound-guided
radioactive seed placement. The radioactive seed is appropriately
positioned within the right breast mass at the [DATE] position
adjacent to the ribbon shaped biopsy marking clip.
IMPRESSION: Appropriate location of the radioactive seed.

Final Assessment: Post Procedure Mammograms for Seed Placement

## 2019-06-30 IMAGING — US US NEEDLE LOCALIZATION*R*
1 series · 4 of 4 positions shown · non-contrast
Comparison: Previous exam(s).

CLINICAL DATA: 68-year-old female with recently diagnosed right
breast cancer presents for radioactive seed localization of the
cm mass in the upper-outer right breast prior to lumpectomy.

EXAM:
ULTRASOUND GUIDED RADIOACTIVE SEED LOCALIZATION OF THE RIGHT BREAST

[Series 1: us needle localization*right* · 0.06mm/px · 4 of 4 slices shown]
[im 1/4]
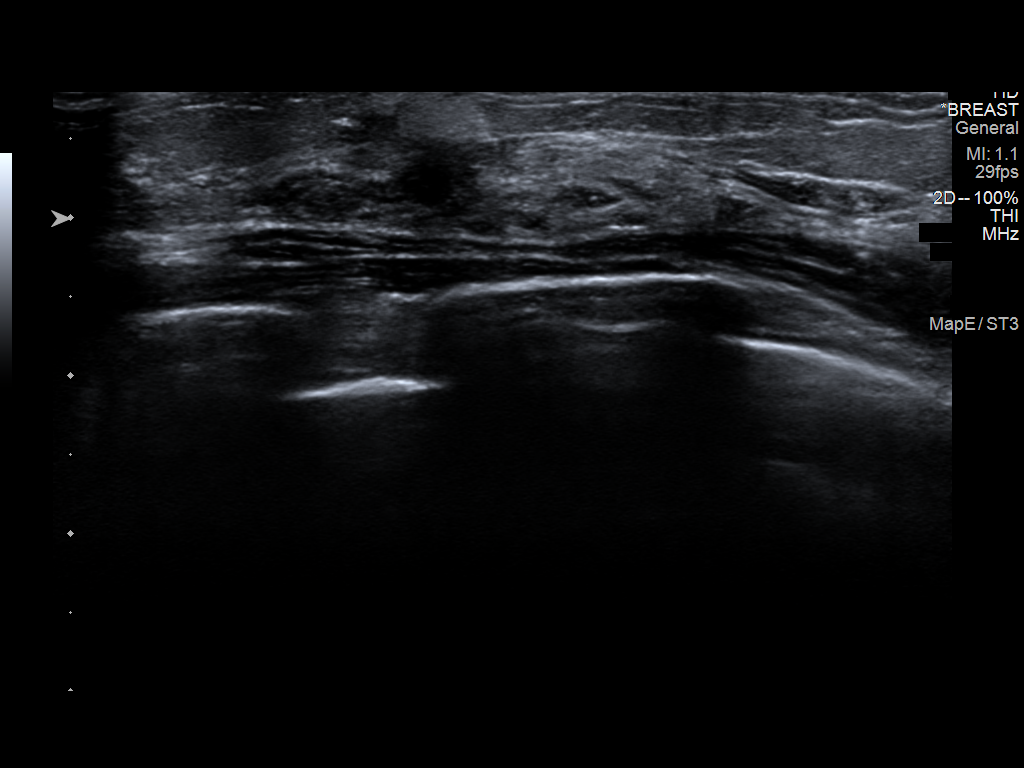
[im 2/4]
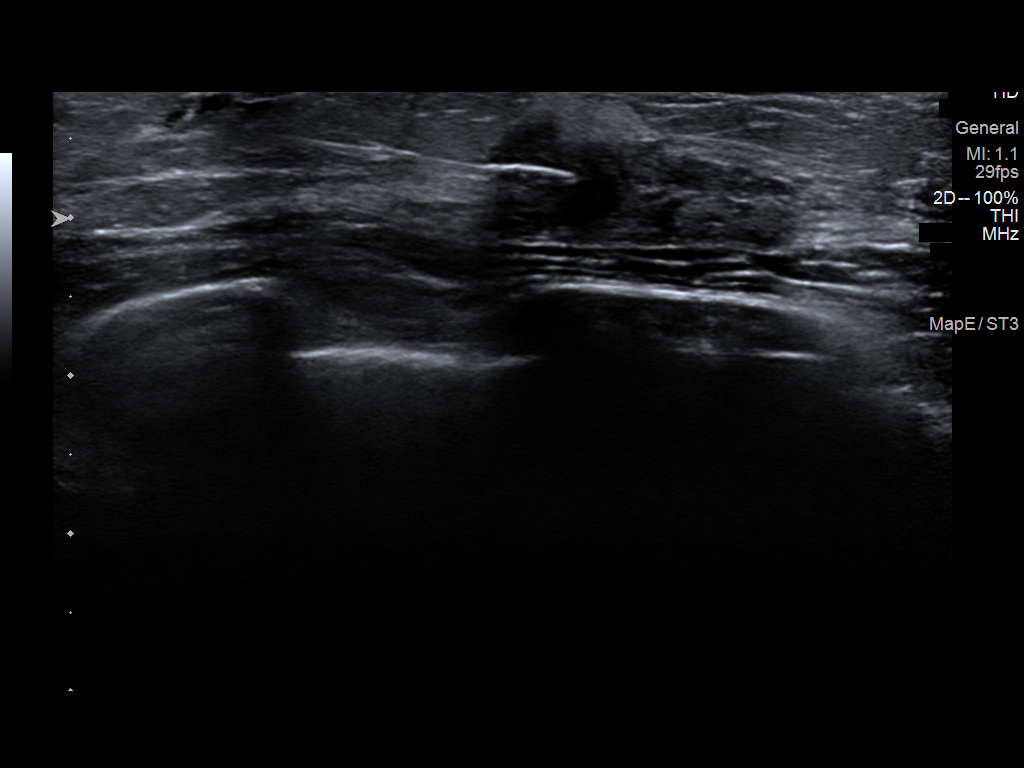
[im 3/4]
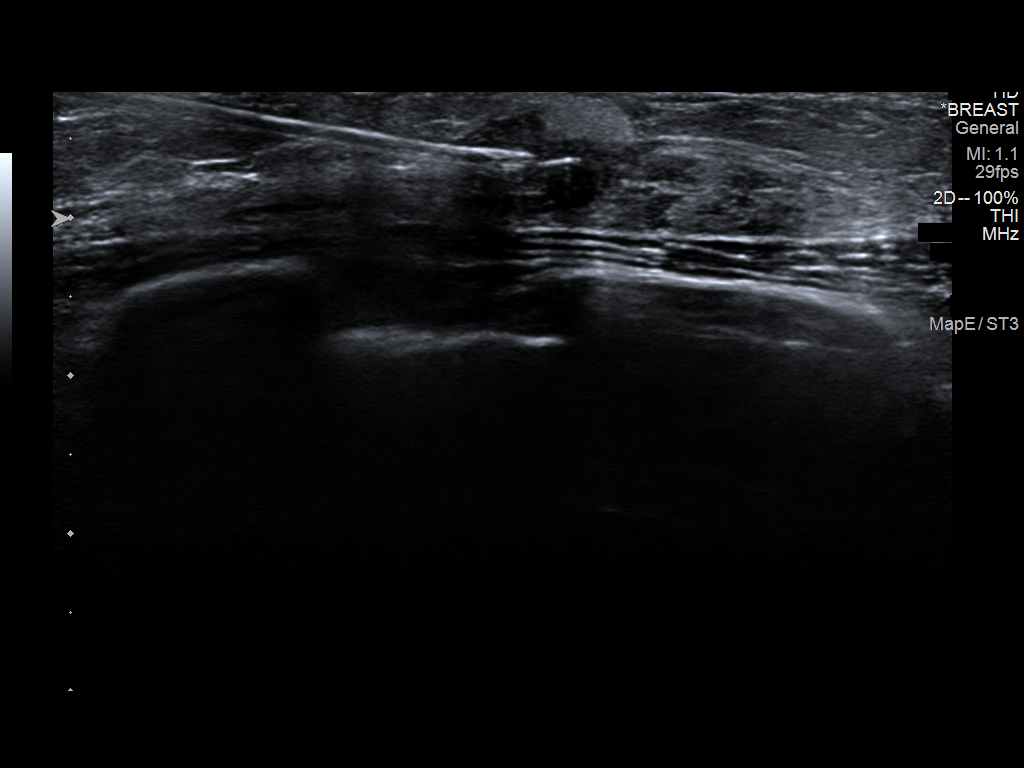
[im 4/4]
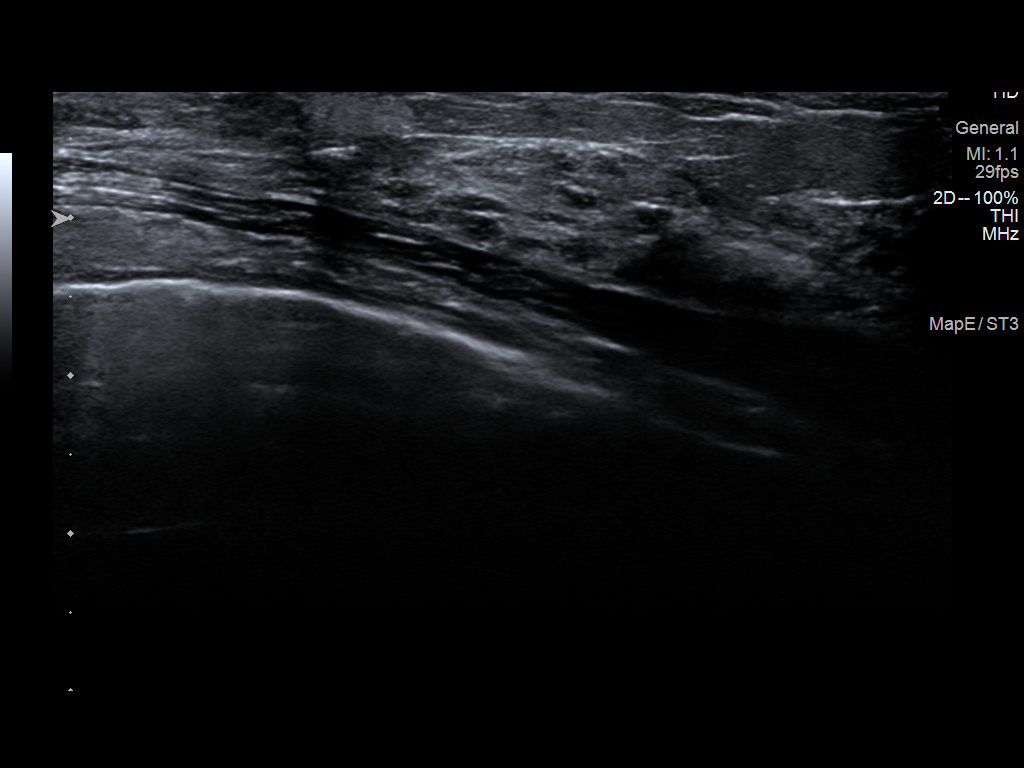

[4 of 4 positions shown; findings below may reference images not displayed]

FINDINGS: Patient presents for radioactive seed localization prior to right
breast lumpectomy. I met with the patient and we discussed the
procedure of seed localization including benefits and alternatives.
We discussed the high likelihood of a successful procedure. We
discussed the risks of the procedure including infection, bleeding,
tissue injury and further surgery. We discussed the low dose of
radioactivity involved in the procedure. Informed, written consent
was given.

The usual time-out protocol was performed immediately prior to the
procedure.

Using ultrasound guidance, sterile technique, 1% lidocaine and an
[LZ] radioactive seed, the mass with associated ribbon shaped
biopsy marking clip was localized using a lateral to medial
approach. The follow-up mammogram images confirm the seed in the
expected location and were marked for Dr. MD SAHJAHAN.

Follow-up survey of the patient confirms presence of the radioactive
seed.

Order number of [LZ] seed:  [PHONE_NUMBER].

Total activity:  0.252 millicuries reference Date: [DATE]

The patient tolerated the procedure well and was released from the
[REDACTED]. She was given instructions regarding seed removal.
IMPRESSION: Radioactive seed localization right breast. No apparent
complications.

## 2019-06-30 NOTE — H&P (Signed)
Lynnell Dike Documented: 06/18/2019 7:26 AM Location: [facility] Patient #: [id number] DOB: 1951-04-05 Undefined / Language: Cleophus Molt / Race: White Female   History of Present Illness Stark Klein MD; 06/18/2019 11:18 AM) The patient is a 69 year old female who presents with breast cancer. Pt is a lovely 69 yo F who is referred by Dr. Rosana Hoes for a new right breast cancer dx 06/2019. She presented with a screening detected right breast mass. Diagnostic imaging showed a 1 cm mass at 10:30. Axilla was negative. 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 She denies breast pain. She has not felt a mass.   She had menarche at age 75-13. She hasn't had periods since 1990 when she had a hysterectomy. 1 ovary was left. She did use HRT for a few years and OCPs for around 20 years. She is up to date wtih colonoscopy and bone density study. She is regular with her mammograms. She had one paternal aunt with breast cancer. She is a former smoker and drinks around 7 beers per week.  Dx mammogram/us 06/03/2019 CLINICAL DATA: Screening recall for a possible right breast mass.  EXAM: DIGITAL DIAGNOSTIC RIGHT MAMMOGRAM WITH CAD AND TOMO  ULTRASOUND RIGHT BREAST  COMPARISON: Previous exam(s).  ACR Breast Density Category d: The breast tissue is extremely dense, which lowers the sensitivity of mammography.  FINDINGS: Spot compression tomosynthesis images through the upper outer posterior right breast demonstrates a possible obscured mass.  Mammographic images were processed with CAD.  On physical exam, there is a firm small palpable lump in the upper-outer quadrant of the right breast.  Ultrasound targeted to the right breast at 10 30, 5 cm from the nipple demonstrates an ill-defined irregular mass with indistinct margins measuring 1.0 x 0.7 x 1.0 cm. Normal lymph nodes are seen  in the right axilla.  IMPRESSION: 1. There is a suspicious mass in the right breast at 10 o'clock.  2. No evidence of right axillary lymphadenopathy.  RECOMMENDATION: Ultrasound guided biopsy is recommended for the right breast mass. This has been scheduled for 06/11/2019 at 1:45 p.m.  I have discussed the findings and recommendations with the patient. If applicable, a reminder letter will be sent to the patient regarding the next appointment.  BI-RADS CATEGORY 5: Highly suggestive of malignancy  pathology 06/11/2019 Breast, right, needle core biopsy, 10:30 o'clock - INVASIVE MAMMARY CARCINOMA, GRADE I/II. Immunohistochemistry for E-Cadherin is strongly positive consistent with ductal carcinoma Estrogen Receptor: 100%, POSITIVE, STRONG STAINING INTENSITY Progesterone Receptor: 90%, POSITIVE, MODERATE STAINING INTENSITY Proliferation Marker Ki67: 15% GROUP 5: HER2 **NEGATIVE**  Labs 06/18/2019 CBC, CMET essentially normal.    Past Surgical History Tawni Pummel, RN; 06/18/2019 7:26 AM) Breast Biopsy  Right. Hysterectomy (not due to cancer) - Partial   Diagnostic Studies History Tawni Pummel, RN; 06/18/2019 7:26 AM) Colonoscopy  1-5 years ago Mammogram  within last year Pap Smear  >5 years ago  Medication History Tawni Pummel, RN; 06/18/2019 7:27 AM) Medications Reconciled  Social History Tawni Pummel, RN; 06/18/2019 7:26 AM) Alcohol use  Moderate alcohol use. Caffeine use  Coffee, Tea. No drug use  Tobacco use  Former smoker.  Family History Tawni Pummel, RN; 06/18/2019 7:26 AM) Diabetes Mellitus  Mother. Heart Disease  Father. Hypertension  Father.  Pregnancy / Birth History Tawni Pummel, RN; 06/18/2019 7:26 AM) Age at menarche  52 years. Contraceptive History  Oral contraceptives. Gravida  0 Para  0 Regular periods   Other Problems Tawni Pummel, RN; 06/18/2019 7:26 AM) Breast Cancer  Other disease, cancer, significant  illness     Review of Systems Sunday Spillers Ledford RN; 06/18/2019 7:26 AM) General Present- Weight Loss. Not Present- Appetite Loss, Chills, Fatigue, Fever, Night Sweats and Weight Gain. Skin Present- Dryness. Not Present- Change in Wart/Mole, Hives, Jaundice, New Lesions, Non-Healing Wounds, Rash and Ulcer. HEENT Present- Wears glasses/contact lenses. Not Present- Earache, Hearing Loss, Hoarseness, Nose Bleed, Oral Ulcers, Ringing in the Ears, Seasonal Allergies, Sinus Pain, Sore Throat, Visual Disturbances and Yellow Eyes. Respiratory Not Present- Bloody sputum, Chronic Cough, Difficulty Breathing, Snoring and Wheezing. Breast Not Present- Breast Mass, Breast Pain, Nipple Discharge and Skin Changes. Cardiovascular Not Present- Chest Pain, Difficulty Breathing Lying Down, Leg Cramps, Palpitations, Rapid Heart Rate, Shortness of Breath and Swelling of Extremities. Gastrointestinal Not Present- Abdominal Pain, Bloating, Bloody Stool, Change in Bowel Habits, Chronic diarrhea, Constipation, Difficulty Swallowing, Excessive gas, Gets full quickly at meals, Hemorrhoids, Indigestion, Nausea, Rectal Pain and Vomiting. Female Genitourinary Not Present- Frequency, Nocturia, Painful Urination, Pelvic Pain and Urgency. Musculoskeletal Not Present- Back Pain, Joint Pain, Joint Stiffness, Muscle Pain, Muscle Weakness and Swelling of Extremities. Neurological Not Present- Decreased Memory, Fainting, Headaches, Numbness, Seizures, Tingling, Tremor, Trouble walking and Weakness. Psychiatric Not Present- Anxiety, Bipolar, Change in Sleep Pattern, Depression, Fearful and Frequent crying. Hematology Not Present- Blood Thinners, Easy Bruising, Excessive bleeding, Gland problems, HIV and Persistent Infections.  Vitals Stark Klein MD; 06/18/2019 11:07 AM) 06/18/2019 11:06 AM Weight: 122.2 lb Height: 66in Body Surface Area: 1.62 m Body Mass Index: 19.72 kg/m  Temp.: 97.30F  Pulse: 56 (Regular)  Resp.: 18  (Unlabored)  BP: 117/82 (Sitting, Left Arm, Standard)       Physical Exam Stark Klein MD; 06/18/2019 11:14 AM) General Mental Status-Alert. General Appearance-Consistent with stated age. Hydration-Well hydrated. Voice-Normal.  Head and Neck Head-normocephalic, atraumatic with no lesions or palpable masses. Trachea-midline. Thyroid Gland Characteristics - normal size and consistency.  Eye Eyeball - Bilateral-Extraocular movements intact. Sclera/Conjunctiva - Bilateral-No scleral icterus.  Chest and Lung Exam Chest and lung exam reveals -quiet, even and easy respiratory effort with no use of accessory muscles and on auscultation, normal breath sounds, no adventitious sounds and normal vocal resonance. Inspection Chest Wall - Normal. Back - normal.  Breast Note: breasts are very dense centrally. No palpable right mass is present. There is bruising in the UOQ of the right breast. She has no LAD, no nipple retraction, no nipple discharge, and no breast lymphedema. Left breast is normal. right breast is also slightly smaller than left.   Cardiovascular Cardiovascular examination reveals -normal heart sounds, regular rate and rhythm with no murmurs and normal pedal pulses bilaterally.  Abdomen Inspection Inspection of the abdomen reveals - No Hernias. Palpation/Percussion Palpation and Percussion of the abdomen reveal - Soft, Non Tender, No Rebound tenderness, No Rigidity (guarding) and No hepatosplenomegaly. Auscultation Auscultation of the abdomen reveals - Bowel sounds normal.  Neurologic Neurologic evaluation reveals -alert and oriented x 3 with no impairment of recent or remote memory. Mental Status-Normal.  Musculoskeletal Global Assessment -Note: no gross deformities.  Normal Exam - Left-Upper Extremity Strength Normal and Lower Extremity Strength Normal. Normal Exam - Right-Upper Extremity Strength Normal and Lower Extremity  Strength Normal.  Lymphatic Head & Neck  General Head & Neck Lymphatics: Bilateral - Description - Normal. Axillary  General Axillary Region: Bilateral - Description - Normal. Tenderness - Non Tender. Femoral & Inguinal  Generalized Femoral & Inguinal Lymphatics:  Bilateral - Description - No Generalized lymphadenopathy.    Assessment & Plan Stark Klein MD; 06/18/2019 11:18 AM) MALIGNANT NEOPLASM OF UPPER-OUTER QUADRANT OF RIGHT BREAST IN FEMALE, ESTROGEN RECEPTOR POSITIVE (C50.411) Impression: Pt has a new dx of cT1cN0 right breast cancer, hormone responsive. Will get MRI based on breast density D. If no additional findings, can plan seed localized lumpectomy with sentinel lymph node biopsy. I discussed that a high percentage of women with cancer who get breast MRIs require biopsy, so that would not be unexpected.  Surgery would be followed by oncotype/mammaprint depending on final pathology and external radiation.  The surgical procedure was described to the patient. I discussed the incision type and location and that we would need radiology involved on with a wire or seed marker and/or sentinel node.  The risks and benefits of the procedure were described to the patient and she wishes to proceed.  We discussed the risks bleeding, infection, damage to other structures, need for further procedures/surgeries. We discussed the risk of seroma. The patient was advised if the area in the breast in cancer, we may need to go back to surgery for additional tissue to obtain negative margins or for a lymph node biopsy. The patient was advised that these are the most common complications, but that others can occur as well. They were advised against taking aspirin or other anti-inflammatory agents/blood thinners the week before surgery. Current Plans MR BREAST BILAT WO&W CONTRAST ([id number]) (already ordered.) Pt Education - [facility] breast cancer surgery: discussed with patient and provided  information.   Signed electronically by Stark Klein, MD (06/18/2019 11:19 AM)

## 2019-07-01 ENCOUNTER — Encounter (HOSPITAL_BASED_OUTPATIENT_CLINIC_OR_DEPARTMENT_OTHER)
Admission: RE | Disposition: A | Discharge status: Home / Self Care | Payer: Self-pay | Source: Home / Self Care | Attending: General Surgery

## 2019-07-01 ENCOUNTER — Ambulatory Visit
Admission: RE | Admit: 2019-07-01 | Discharge: 2019-07-01 | Disposition: A | Discharge status: Home / Self Care | Payer: Medicare Other | Source: Ambulatory Visit | Attending: General Surgery | Admitting: General Surgery

## 2019-07-01 ENCOUNTER — Ambulatory Visit (HOSPITAL_BASED_OUTPATIENT_CLINIC_OR_DEPARTMENT_OTHER): Payer: Medicare Other | Admitting: Anesthesiology

## 2019-07-01 ENCOUNTER — Other Ambulatory Visit: Payer: Self-pay

## 2019-07-01 ENCOUNTER — Encounter (HOSPITAL_COMMUNITY)
Admission: RE | Admit: 2019-07-01 | Discharge: 2019-07-01 | Disposition: A | Discharge status: Home / Self Care | Payer: Medicare Other | Source: Ambulatory Visit | Attending: General Surgery | Admitting: General Surgery

## 2019-07-01 ENCOUNTER — Encounter (HOSPITAL_BASED_OUTPATIENT_CLINIC_OR_DEPARTMENT_OTHER): Payer: Self-pay | Admitting: General Surgery

## 2019-07-01 ENCOUNTER — Ambulatory Visit (HOSPITAL_BASED_OUTPATIENT_CLINIC_OR_DEPARTMENT_OTHER)
Admission: RE | Admit: 2019-07-01 | Discharge: 2019-07-01 | Disposition: A | Discharge status: Home / Self Care | Payer: Medicare Other | Attending: General Surgery | Admitting: General Surgery

## 2019-07-01 ENCOUNTER — Ambulatory Visit (HOSPITAL_COMMUNITY): Payer: Medicare Other

## 2019-07-01 DIAGNOSIS — Z87891 Personal history of nicotine dependence: Secondary | ICD-10-CM | POA: Diagnosis not present

## 2019-07-01 DIAGNOSIS — I252 Old myocardial infarction: Secondary | ICD-10-CM | POA: Insufficient documentation

## 2019-07-01 DIAGNOSIS — C50411 Malignant neoplasm of upper-outer quadrant of right female breast: Secondary | ICD-10-CM

## 2019-07-01 DIAGNOSIS — Z170 Estrogen receptor positive status [ER+]: Secondary | ICD-10-CM | POA: Diagnosis not present

## 2019-07-01 HISTORY — PX: BREAST LUMPECTOMY WITH RADIOACTIVE SEED AND SENTINEL LYMPH NODE BIOPSY: SHX6550

## 2019-07-01 IMAGING — DX MM BREAST SURGICAL SPECIMEN
1 of 2 series · 3 of 32 positions shown, 4 images · non-contrast
Comparison: Previous exam(s).

CLINICAL DATA: Evaluate specimen

EXAM:
SPECIMEN RADIOGRAPH OF THE RIGHT BREAST

[Series 3: specimen ct · coronal · right · 0.10mm/px · 3 of 474 slices shown, 4 images]
[im 91/474  mediastinal]
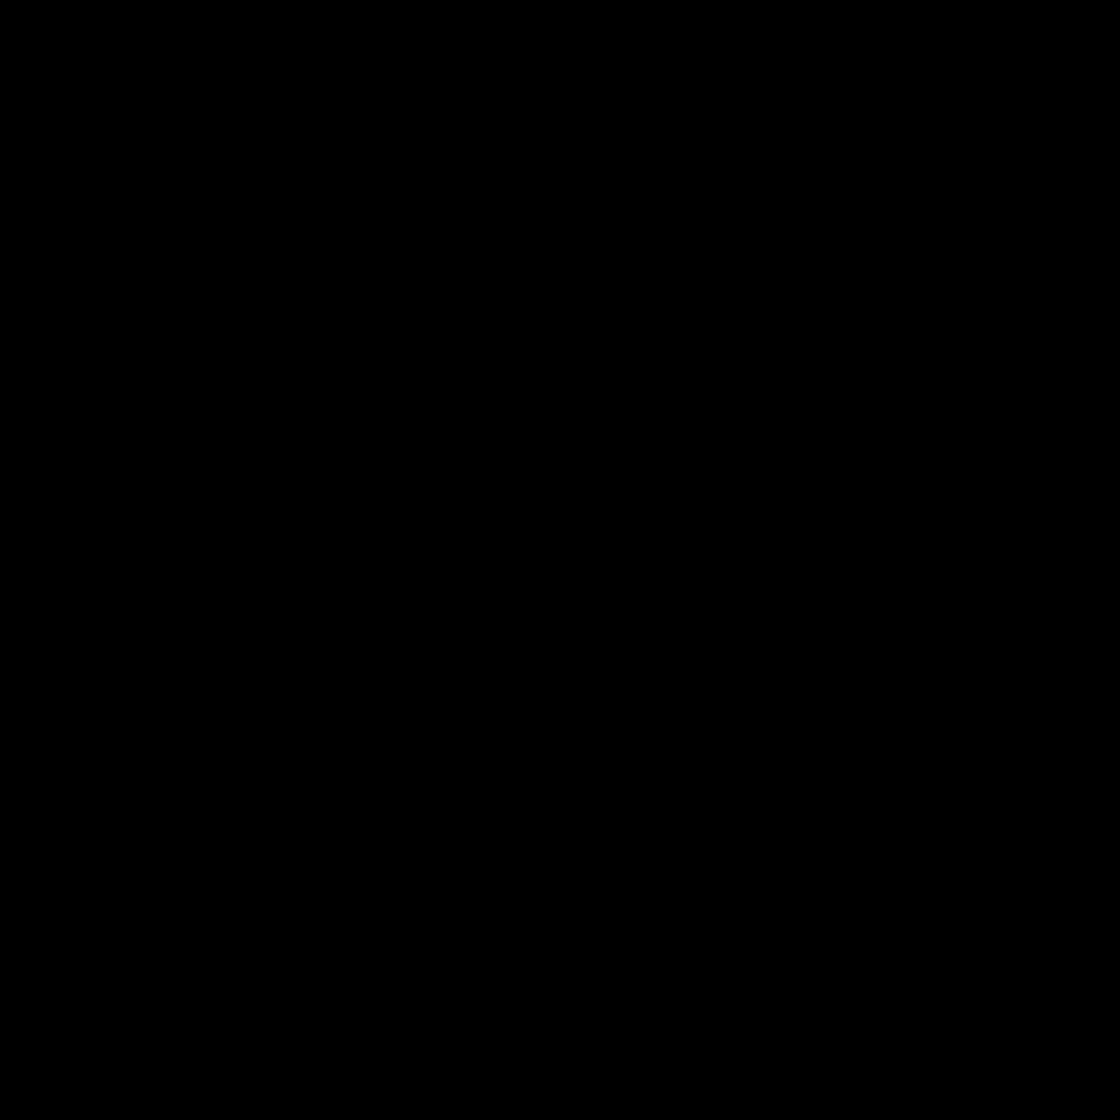
[im 91/474  lung]
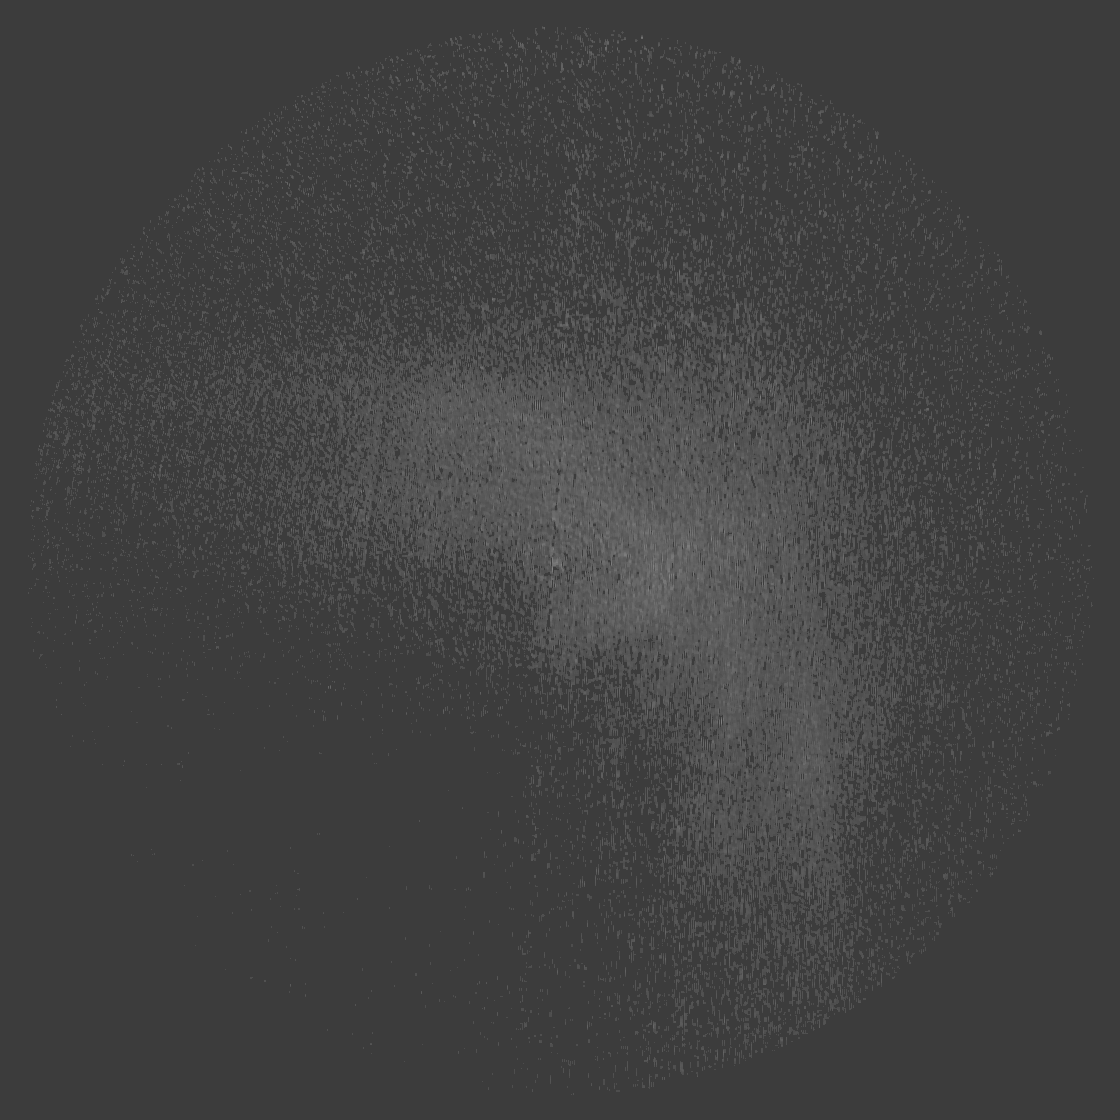
[im 237/474  lung]
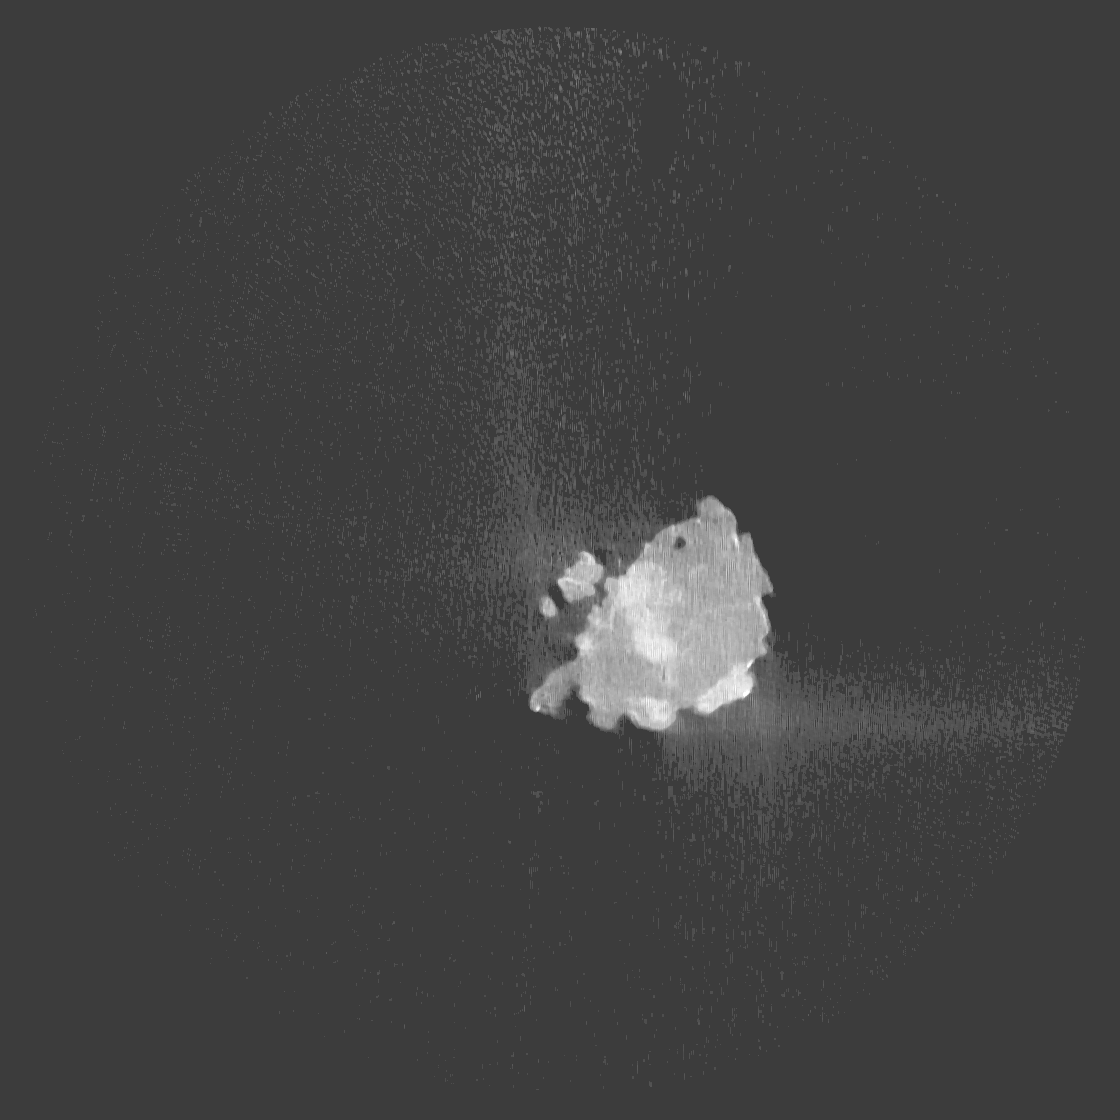
[im 383/474  lung]
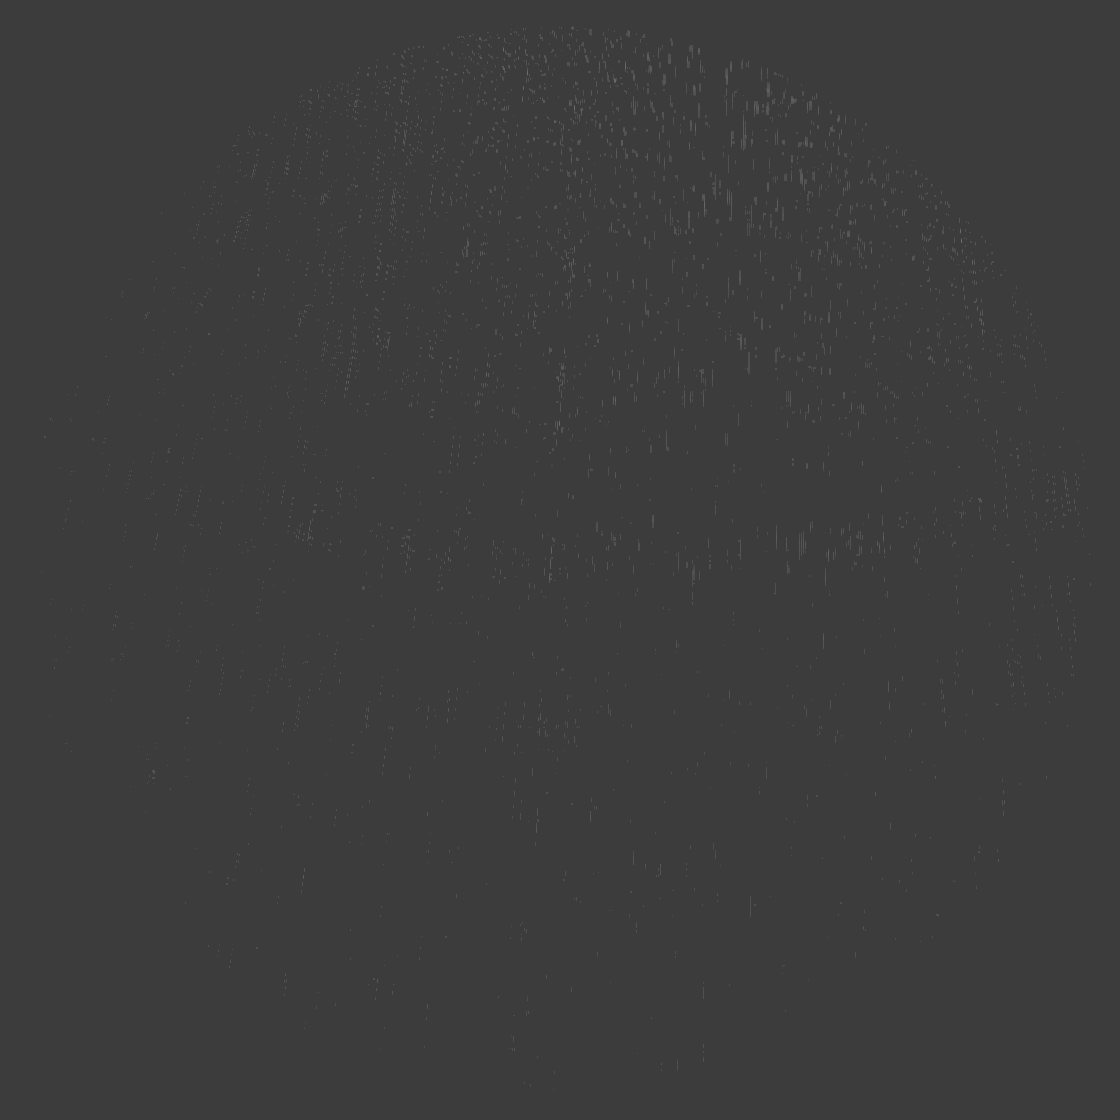

[3 of 32 positions shown; findings below may reference images not displayed]

FINDINGS: Status post excision of the right breast. The radioactive seed and
biopsy marker clip are present, completely intact, and were marked
for pathology.
IMPRESSION: Specimen radiograph of the right breast.

## 2019-07-01 SURGERY — BREAST LUMPECTOMY WITH RADIOACTIVE SEED AND SENTINEL LYMPH NODE BIOPSY
Anesthesia: General | Site: Breast | Laterality: Right

## 2019-07-01 MED ORDER — FENTANYL CITRATE (PF) 100 MCG/2ML IJ SOLN
INTRAMUSCULAR | Status: AC
  Filled 2019-07-01: qty 2

## 2019-07-01 MED ORDER — LIDOCAINE-EPINEPHRINE 0.5 %-1:200000 IJ SOLN
INTRAMUSCULAR | Status: DC | PRN
  Administered 2019-07-01: 10 mL

## 2019-07-01 MED ORDER — BUPIVACAINE HCL 0.25 % IJ SOLN
INTRAMUSCULAR | Status: DC | PRN
  Administered 2019-07-01: 10 mL

## 2019-07-01 MED ORDER — SODIUM CHLORIDE (PF) 0.9 % IJ SOLN
INTRAVENOUS | Status: DC | PRN
  Administered 2019-07-01: 5 mL via INTRAMUSCULAR

## 2019-07-01 MED ORDER — CHLORHEXIDINE GLUCONATE CLOTH 2 % EX PADS: 6.0000 | MEDICATED_PAD | Freq: Once | CUTANEOUS | Status: DC

## 2019-07-01 MED ORDER — BACITRACIN ZINC 500 UNIT/GM EX OINT
TOPICAL_OINTMENT | CUTANEOUS | Status: AC
  Filled 2019-07-01: qty 28.35

## 2019-07-01 MED ORDER — ACETAMINOPHEN 500 MG PO TABS
1000.0000 mg | ORAL_TABLET | ORAL | Status: AC
  Administered 2019-07-01: 1000 mg via ORAL

## 2019-07-01 MED ORDER — HYDROMORPHONE HCL 1 MG/ML IJ SOLN: 0.2500 mg | INTRAMUSCULAR | Status: DC | PRN

## 2019-07-01 MED ORDER — LIDOCAINE 2% (20 MG/ML) 5 ML SYRINGE
INTRAMUSCULAR | Status: DC | PRN
  Administered 2019-07-01: 100 mg via INTRAVENOUS

## 2019-07-01 MED ORDER — DEXAMETHASONE SODIUM PHOSPHATE 10 MG/ML IJ SOLN
INTRAMUSCULAR | Status: DC | PRN
  Administered 2019-07-01: 5 mg via INTRAVENOUS

## 2019-07-01 MED ORDER — TECHNETIUM TC 99M SULFUR COLLOID FILTERED
1.0000 | Freq: Once | INTRAVENOUS | Status: AC | PRN
  Administered 2019-07-01: 1 via INTRADERMAL

## 2019-07-01 MED ORDER — LIDOCAINE 2% (20 MG/ML) 5 ML SYRINGE
INTRAMUSCULAR | Status: AC
  Filled 2019-07-01: qty 5

## 2019-07-01 MED ORDER — CEFAZOLIN SODIUM-DEXTROSE 2-4 GM/100ML-% IV SOLN
2.0000 g | INTRAVENOUS | Status: AC
  Administered 2019-07-01: 2 g via INTRAVENOUS

## 2019-07-01 MED ORDER — ACETAMINOPHEN 500 MG PO TABS
ORAL_TABLET | ORAL | Status: AC
  Filled 2019-07-01: qty 2

## 2019-07-01 MED ORDER — PROPOFOL 10 MG/ML IV BOLUS
INTRAVENOUS | Status: AC
  Filled 2019-07-01: qty 20

## 2019-07-01 MED ORDER — GABAPENTIN 100 MG PO CAPS
ORAL_CAPSULE | ORAL | Status: AC
  Filled 2019-07-01: qty 1

## 2019-07-01 MED ORDER — OXYCODONE HCL 5 MG PO TABS: 5.0000 mg | ORAL_TABLET | Freq: Four times a day (QID) | ORAL | 0 refills | Status: DC | PRN

## 2019-07-01 MED ORDER — ONDANSETRON HCL 4 MG/2ML IJ SOLN
INTRAMUSCULAR | Status: DC | PRN
  Administered 2019-07-01: 4 mg via INTRAVENOUS

## 2019-07-01 MED ORDER — MIDAZOLAM HCL 2 MG/2ML IJ SOLN
1.0000 mg | INTRAMUSCULAR | Status: DC | PRN
  Administered 2019-07-01: 2 mg via INTRAVENOUS

## 2019-07-01 MED ORDER — GLYCOPYRROLATE PF 0.2 MG/ML IJ SOSY
PREFILLED_SYRINGE | INTRAMUSCULAR | Status: AC
  Filled 2019-07-01: qty 1

## 2019-07-01 MED ORDER — EPHEDRINE SULFATE 50 MG/ML IJ SOLN
INTRAMUSCULAR | Status: DC | PRN
  Administered 2019-07-01: 5 mg via INTRAVENOUS

## 2019-07-01 MED ORDER — CEFAZOLIN SODIUM-DEXTROSE 2-4 GM/100ML-% IV SOLN
INTRAVENOUS | Status: AC
  Filled 2019-07-01: qty 100

## 2019-07-01 MED ORDER — PROPOFOL 10 MG/ML IV BOLUS
INTRAVENOUS | Status: DC | PRN
  Administered 2019-07-01: 170 mg via INTRAVENOUS

## 2019-07-01 MED ORDER — FENTANYL CITRATE (PF) 100 MCG/2ML IJ SOLN
50.0000 ug | INTRAMUSCULAR | Status: DC | PRN
  Administered 2019-07-01: 25 ug via INTRAVENOUS
  Administered 2019-07-01: 100 ug via INTRAVENOUS

## 2019-07-01 MED ORDER — MIDAZOLAM HCL 2 MG/2ML IJ SOLN
INTRAMUSCULAR | Status: AC
  Filled 2019-07-01: qty 2

## 2019-07-01 MED ORDER — METHYLENE BLUE 0.5 % INJ SOLN
INTRAVENOUS | Status: AC
  Filled 2019-07-01: qty 10

## 2019-07-01 MED ORDER — GABAPENTIN 100 MG PO CAPS
100.0000 mg | ORAL_CAPSULE | ORAL | Status: AC
  Administered 2019-07-01: 100 mg via ORAL

## 2019-07-01 MED ORDER — LACTATED RINGERS IV SOLN: INTRAVENOUS | Status: DC

## 2019-07-01 MED ORDER — GLYCOPYRROLATE 0.2 MG/ML IJ SOLN
INTRAMUSCULAR | Status: DC | PRN
  Administered 2019-07-01: .2 mg via INTRAVENOUS

## 2019-07-01 MED ORDER — SODIUM CHLORIDE (PF) 0.9 % IJ SOLN
INTRAMUSCULAR | Status: AC
  Filled 2019-07-01: qty 10

## 2019-07-01 SURGICAL SUPPLY — 58 items
BINDER BREAST MEDIUM (GAUZE/BANDAGES/DRESSINGS) IMPLANT
BLADE SURG 10 STRL SS (BLADE) ×2 IMPLANT
BLADE SURG 15 STRL LF DISP TIS (BLADE) ×1 IMPLANT
BLADE SURG 15 STRL SS (BLADE) ×1
BNDG COHESIVE 4X5 TAN STRL (GAUZE/BANDAGES/DRESSINGS) ×2 IMPLANT
CANISTER SUCT 1200ML W/VALVE (MISCELLANEOUS) ×2 IMPLANT
CHLORAPREP W/TINT 26 (MISCELLANEOUS) ×2 IMPLANT
CLIP VESOCCLUDE LG 6/CT (CLIP) ×2 IMPLANT
CLIP VESOCCLUDE MED 6/CT (CLIP) ×3 IMPLANT
COVER MAYO STAND STRL (DRAPES) ×3 IMPLANT
COVER PROBE W GEL 5X96 (DRAPES) ×2 IMPLANT
DERMABOND ADVANCED (GAUZE/BANDAGES/DRESSINGS) ×1
DERMABOND ADVANCED .7 DNX12 (GAUZE/BANDAGES/DRESSINGS) ×1 IMPLANT
DRAPE UTILITY XL STRL (DRAPES) ×2 IMPLANT
DRSG PAD ABDOMINAL 8X10 ST (GAUZE/BANDAGES/DRESSINGS) ×2 IMPLANT
ELECT COATED BLADE 2.86 ST (ELECTRODE) ×2 IMPLANT
ELECT REM PT RETURN 9FT ADLT (ELECTROSURGICAL) ×2
ELECTRODE REM PT RTRN 9FT ADLT (ELECTROSURGICAL) ×1 IMPLANT
GAUZE SPONGE 4X4 12PLY STRL LF (GAUZE/BANDAGES/DRESSINGS) ×2 IMPLANT
GLOVE BIOGEL PI IND STRL 6.5 (GLOVE) ×1 IMPLANT
GLOVE BIOGEL PI IND STRL 7.0 (GLOVE) IMPLANT
GLOVE BIOGEL PI INDICATOR 6.5 (GLOVE) ×1
GLOVE BIOGEL PI INDICATOR 7.0 (GLOVE) ×1
GLOVE SURG SS PI 6.0 STRL IVOR (GLOVE) ×2 IMPLANT
GLOVE SURG SS PI 6.5 STRL IVOR (GLOVE) ×2 IMPLANT
GOWN STRL REUS W/ TWL LRG LVL3 (GOWN DISPOSABLE) ×1 IMPLANT
GOWN STRL REUS W/TWL 2XL LVL3 (GOWN DISPOSABLE) ×2 IMPLANT
GOWN STRL REUS W/TWL LRG LVL3 (GOWN DISPOSABLE) ×2
KIT MARKER MARGIN INK (KITS) ×2 IMPLANT
LIGHT WAVEGUIDE WIDE FLAT (MISCELLANEOUS) ×1 IMPLANT
NDL HYPO 25X1 1.5 SAFETY (NEEDLE) ×1 IMPLANT
NDL SAFETY ECLIPSE 18X1.5 (NEEDLE) ×1 IMPLANT
NEEDLE HYPO 18GX1.5 SHARP (NEEDLE) ×1
NEEDLE HYPO 25X1 1.5 SAFETY (NEEDLE) ×4 IMPLANT
NS IRRIG 1000ML POUR BTL (IV SOLUTION) ×2 IMPLANT
PACK BASIN DAY SURGERY FS (CUSTOM PROCEDURE TRAY) ×2 IMPLANT
PACK UNIVERSAL I (CUSTOM PROCEDURE TRAY) ×2 IMPLANT
PENCIL SMOKE EVACUATOR (MISCELLANEOUS) ×2 IMPLANT
SLEEVE SCD COMPRESS KNEE MED (MISCELLANEOUS) ×2 IMPLANT
SPONGE LAP 18X18 RF (DISPOSABLE) ×5 IMPLANT
STAPLER VISISTAT 35W (STAPLE) IMPLANT
STOCKINETTE IMPERVIOUS LG (DRAPES) ×2 IMPLANT
STRIP CLOSURE SKIN 1/2X4 (GAUZE/BANDAGES/DRESSINGS) ×2 IMPLANT
SUT ETHILON 2 0 FS 18 (SUTURE) IMPLANT
SUT MNCRL AB 4-0 PS2 18 (SUTURE) ×2 IMPLANT
SUT MON AB 5-0 PS2 18 (SUTURE) IMPLANT
SUT SILK 2 0 SH (SUTURE) IMPLANT
SUT VIC AB 2-0 SH 27 (SUTURE) ×1
SUT VIC AB 2-0 SH 27XBRD (SUTURE) ×1 IMPLANT
SUT VIC AB 3-0 SH 27 (SUTURE)
SUT VIC AB 3-0 SH 27X BRD (SUTURE) ×1 IMPLANT
SUT VICRYL 3-0 CR8 SH (SUTURE) ×2 IMPLANT
SYR BULB 3OZ (MISCELLANEOUS) ×2 IMPLANT
SYR CONTROL 10ML LL (SYRINGE) ×3 IMPLANT
TOWEL GREEN STERILE FF (TOWEL DISPOSABLE) ×2 IMPLANT
TRAY FAXITRON CT DISP (TRAY / TRAY PROCEDURE) ×2 IMPLANT
TUBE CONNECTING 20X1/4 (TUBING) ×2 IMPLANT
YANKAUER SUCT BULB TIP NO VENT (SUCTIONS) ×2 IMPLANT

## 2019-07-01 NOTE — Anesthesia Postprocedure Evaluation (Signed)
Anesthesia Post Note  Patient: Carrie Serrano  Procedure(s) Performed: RIGHT BREAST LUMPECTOMY WITH RADIOACTIVE SEED AND SENTINEL LYMPH NODE BIOPSY (Right Breast)     Patient location during evaluation: PACU Anesthesia Type: General Level of consciousness: awake and alert Pain management: pain level controlled Vital Signs Assessment: post-procedure vital signs reviewed and stable Respiratory status: spontaneous breathing, nonlabored ventilation and respiratory function stable Cardiovascular status: blood pressure returned to baseline and stable Postop Assessment: no apparent nausea or vomiting Anesthetic complications: no    Last Vitals:  Vitals:   07/01/19 1245 07/01/19 1300  BP: 138/77 138/82  Pulse: (!) 54 (!) 50  Resp: 14 (!) 21  Temp:    SpO2: 100% 100%    Last Pain:  Vitals:   07/01/19 1300  TempSrc:   PainSc: [place]

## 2019-07-01 NOTE — Progress Notes (Signed)
At bedside with nuclear medicine Shanon Brow) while procedure was performed.  Patient is A & O x4.

## 2019-07-01 NOTE — Transfer of Care (Signed)
Immediate Anesthesia Transfer of Care Note  Patient: STEHANIE KUKULKA  Procedure(s) Performed: RIGHT BREAST LUMPECTOMY WITH RADIOACTIVE SEED AND SENTINEL LYMPH NODE BIOPSY (Right Breast)  Patient Location: PACU  Anesthesia Type:GA combined with regional for post-op pain  Level of Consciousness: awake, alert , oriented and patient cooperative  Airway & Oxygen Therapy: Patient Spontanous Breathing and Patient connected to nasal cannula oxygen  Post-op Assessment: Report given to RN, Post -op Vital signs reviewed and stable and Patient moving all extremities  Post vital signs: Reviewed and stable  Last Vitals:  Vitals Value Taken Time  BP 135/80 07/01/19 1232  Temp    Pulse 71 07/01/19 1235  Resp 19 07/01/19 1235  SpO2 100 % 07/01/19 1235  Vitals shown include unvalidated device data.  Last Pain:  Vitals:   07/01/19 1040  TempSrc:   PainSc: 0-No pain         Complications: No apparent anesthesia complications

## 2019-07-01 NOTE — Op Note (Signed)
Right Breast Radioactive seed localized lumpectomy and sentinel lymph node biopsy  Indications: This patient presents with history of right breast cancer, upper outer quadrant, cT1cN0, grade 1-2 invasive mammary carcinoma (ductal phenotype), +/+/-  Pre-operative Diagnosis: same  Post-operative Diagnosis: Same  Surgeon: Stark Klein   Anesthesia: General endotracheal anesthesia  ASA Class: 2  Procedure Details  The patient was seen in the Holding Room. The risks, benefits, complications, treatment options, and expected outcomes were discussed with the patient. The possibilities of bleeding, infection, the need for additional procedures, failure to diagnose a condition, and creating a complication requiring transfusion or operation were discussed with the patient. The patient concurred with the proposed plan, giving informed consent.  The site of surgery properly noted/marked. The patient was taken to Operating Room # 8, identified, and the procedure verified as Right Breast seed localized Lumpectomy with sentinel lymph node biopsy. A Time Out was held and the above information confirmed.  The right arm, breast, and chest were prepped and draped in standard fashion. The methylene blue was administered in the retroareolar locationThe lumpectomy was performed by creating an axillary incision near the previously placed radioactive seed.  Dissection was carried out around the point of maximum signal intensity. The cautery was used to perform the dissection.  Hemostasis was achieved with cautery. The edges of the cavity were marked with large clips.  The specimen was inked with the margin marker paint kit.    Specimen radiography confirmed inclusion of the mammographic lesion, the clip, and the seed.  The background signal in the breast was zero.  There were two areas of cautery burn.    Using a hand-held gamma probe, right axillary sentinel nodes were identified.  The same axillary incision was used..   Dissection was carried through the clavipectoral fascia.  Two deep level two axillary sentinel nodes were removed.  Counts per second were 53 and 16.    The background count was 0 cps.  The wound was irrigated.  Hemostasis was achieved with cautery.  The axillary incision was closed with a 3-0 vicryl deep dermal interrupted sutures and a 4-0 monocryl subcuticular closure.  The incision was dressed with dermabond.  The cautery burn areas were dressed with bacitracin.      Sterile dressings were applied. At the end of the operation, all sponge, instrument, and needle counts were correct.  Findings: grossly clear surgical margins and no adenopathy.  Posterior margin is pectoralis, anterior margin is skin, superior margin is axilla.    Estimated Blood Loss:  min         Specimens: right breast tissue with seed and two deep right axillary sentinel lymph nodes.             Complications:  Two areas of cautery burn to skin.           Disposition: PACU - hemodynamically stable.         Condition: stable

## 2019-07-01 NOTE — Discharge Instructions (Addendum)
[place] Office Phone Number [phone]  BREAST BIOPSY/ PARTIAL MASTECTOMY: POST OP INSTRUCTIONS  Always review your discharge instruction sheet given to you by the facility where your surgery was performed.  IF YOU HAVE DISABILITY OR FAMILY LEAVE FORMS, YOU MUST BRING THEM TO THE OFFICE FOR PROCESSING.  DO NOT GIVE THEM TO YOUR DOCTOR.  1. A prescription for pain medication may be given to you upon discharge.  Take your pain medication as prescribed, if needed.  If narcotic pain medicine is not needed, then you may take acetaminophen (Tylenol) or ibuprofen (Advil) as needed.  No Tylenol until 4pm if needed 2. Take your usually prescribed medications unless otherwise directed 3. If you need a refill on your pain medication, please contact your pharmacy.  They will contact our office to request authorization.  Prescriptions will not be filled after 5pm or on week-ends. 4. You should eat very light the first 24 hours after surgery, such as soup, crackers, pudding, etc.  Resume your normal diet the day after surgery. 5. Most patients will experience some swelling and bruising in the breast.  Ice packs and a good support bra will help.  Swelling and bruising can take several days to resolve.  6. It is common to experience some constipation if taking pain medication after surgery.  Increasing fluid intake and taking a stool softener will usually help or prevent this problem from occurring.  A mild laxative (Milk of Magnesia or Miralax) should be taken according to package directions if there are no bowel movements after 48 hours. 7. Unless discharge instructions indicate otherwise, you may remove your bandages 48 hours after surgery, and you may shower at that time.  8. Apply bacitracin to the raw areas on the breast skin daily after shower and as needed.   9. ACTIVITIES:  You may resume regular daily activities (gradually increasing) beginning the next day.  Wearing a good support  bra or sports bra (or the breast binder) minimizes pain and swelling.  You may have sexual intercourse when it is comfortable. a. You may drive when you no longer are taking prescription pain medication, you can comfortably wear a seatbelt, and you can safely maneuver your car and apply brakes. b. RETURN TO WORK:  __________1 week_______________ 10. You should see your doctor in the office for a follow-up appointment approximately two weeks after your surgery.  Your doctor's nurse will typically make your follow-up appointment when she calls you with your pathology report.  Expect your pathology report 2-3 business days after your surgery.  You may call to check if you do not hear from [place] after three days.   WHEN TO CALL YOUR DOCTOR: 1. Fever over 101.0 2. Nausea and/or vomiting. 3. Extreme swelling or bruising. 4. Continued bleeding from incision. 5. Increased pain, redness, or drainage from the incision.  The clinic staff is available to answer your questions during regular business hours.  Please don't hesitate to call and ask to speak to one of the nurses for clinical concerns.  If you have a medical emergency, go to the nearest emergency room or call 911.  A surgeon from [facility] is always on call at the hospital.  For further questions, please visit [email or web address]    Post Anesthesia Home Care Instructions  Activity: Get plenty of rest for the remainder of the day. A responsible individual must stay with you for 24 hours following the procedure.  For the next 24 hours, DO NOT: -Drive a car -  Operate machinery -Drink alcoholic beverages -Take any medication unless instructed by your physician -Make any legal decisions or sign important papers.  Meals: Start with liquid foods such as gelatin or soup. Progress to regular foods as tolerated. Avoid greasy, spicy, heavy foods. If nausea and/or vomiting occur, drink only clear liquids until the nausea and/or  vomiting subsides. Call your physician if vomiting continues.  Special Instructions/Symptoms: Your throat may feel dry or sore from the anesthesia or the breathing tube placed in your throat during surgery. If this causes discomfort, gargle with warm salt water. The discomfort should disappear within 24 hours.  If you had a scopolamine patch placed behind your ear for the management of post- operative nausea and/or vomiting:  1. The medication in the patch is effective for 72 hours, after which it should be removed.  Wrap patch in a tissue and discard in the trash. Wash hands thoroughly with soap and water. 2. You may remove the patch earlier than 72 hours if you experience unpleasant side effects which may include dry mouth, dizziness or visual disturbances. 3. Avoid touching the patch. Wash your hands with soap and water after contact with the patch.

## 2019-07-01 NOTE — Anesthesia Procedure Notes (Signed)
Anesthesia Regional Block: Pectoralis block   Pre-Anesthetic Checklist: ,, timeout performed, Correct Patient, Correct Site, Correct Laterality, Correct Procedure, Correct Position, site marked, Risks and benefits discussed,  Surgical consent,  Pre-op evaluation,  At surgeon's request and post-op pain management  Laterality: Right  Prep: chloraprep        Procedures: Doppler guided,,,, ultrasound used (permanent image in chart),,,,  Narrative:  Start time: 07/01/2019 10:45 AM End time: 07/01/2019 11:00 AM Injection made incrementally with aspirations every 5 mL.  Performed by: Personally  Anesthesiologist: Belinda Block, MD

## 2019-07-01 NOTE — Anesthesia Preprocedure Evaluation (Addendum)
Anesthesia Evaluation  Patient identified by MRN, date of birth, ID band Patient awake    Reviewed: Allergy & Precautions, NPO status , Patient's Chart, lab work & pertinent test results  Airway Mallampati: II  TM Distance: >3 FB     Dental   Pulmonary former smoker,    breath sounds clear to auscultation       Cardiovascular + Past MI  + dysrhythmias  Rhythm:Regular Rate:Normal     Neuro/Psych    GI/Hepatic negative GI ROS, Neg liver ROS,   Endo/Other  negative endocrine ROS  Renal/GU negative Renal ROS     Musculoskeletal   Abdominal   Peds  Hematology   Anesthesia Other Findings   Reproductive/Obstetrics                             Anesthesia Physical Anesthesia Plan  ASA: II  Anesthesia Plan: General   Post-op Pain Management:    Induction: Intravenous  PONV Risk Score and Plan: 3 and Ondansetron, Dexamethasone and Midazolam  Airway Management Planned: LMA  Additional Equipment:   Intra-op Plan:   Post-operative Plan: Extubation in OR  Informed Consent: I have reviewed the patients History and Physical, chart, labs and discussed the procedure including the risks, benefits and alternatives for the proposed anesthesia with the patient or authorized representative who has indicated his/her understanding and acceptance.     Dental advisory given  Plan Discussed with: CRNA and Anesthesiologist  Anesthesia Plan Comments:         Anesthesia Quick Evaluation

## 2019-07-01 NOTE — Progress Notes (Signed)
Assisted Dr. Green with right, ultrasound guided, pectoralis block. Side rails up, monitors on throughout procedure. See vital signs in flow sheet. Tolerated Procedure well. 

## 2019-07-01 NOTE — Progress Notes (Signed)
EKG reviewed by Dr. Nyoka Cowden.  Will proceed with surgery as scheduled.

## 2019-07-01 NOTE — Interval H&P Note (Signed)
History and Physical Interval Note:  07/01/2019 9:50 AM  Clayborn Bigness  has presented today for surgery, with the diagnosis of RIGHT BREAST CANCER.  The various methods of treatment have been discussed with the patient and family. After consideration of risks, benefits and other options for treatment, the patient has consented to  Procedure(s) with comments: RIGHT BREAST LUMPECTOMY WITH RADIOACTIVE SEED AND SENTINEL LYMPH NODE BIOPSY (Right) - COMBINE WITH REGIONAL FOR POST OP PAIN as a surgical intervention.  The patient's history has been reviewed, patient examined, no change in status, stable for surgery.  I have reviewed the patient's chart and labs.  Questions were answered to the patient's satisfaction.     Stark Klein

## 2019-07-01 NOTE — Anesthesia Procedure Notes (Signed)
Procedure Name: LMA Insertion Date/Time: 07/01/2019 11:28 AM Performed by: Myna Bright, CRNA Pre-anesthesia Checklist: Patient identified, Emergency Drugs available, Suction available and Patient being monitored Patient Re-evaluated:Patient Re-evaluated prior to induction Oxygen Delivery Method: Circle System Utilized Preoxygenation: Pre-oxygenation with 100% oxygen Induction Type: IV induction Ventilation: Mask ventilation without difficulty LMA: LMA inserted LMA Size: 4.0 Number of attempts: 1 Airway Equipment and Method: Bite block Placement Confirmation: positive ETCO2 Tube secured with: Tape Dental Injury: Teeth and Oropharynx as per pre-operative assessment

## 2019-07-01 NOTE — Anesthesia Postprocedure Evaluation (Signed)
Anesthesia Post Note  Patient: Carrie Serrano  Procedure(s) Performed: RIGHT BREAST LUMPECTOMY WITH RADIOACTIVE SEED AND SENTINEL LYMPH NODE BIOPSY (Right Breast)     Patient location during evaluation: PACU Anesthesia Type: General Level of consciousness: awake Pain management: pain level controlled Vital Signs Assessment: post-procedure vital signs reviewed and stable Respiratory status: spontaneous breathing Cardiovascular status: stable Postop Assessment: no apparent nausea or vomiting Anesthetic complications: no    Last Vitals:  Vitals:   07/01/19 1300 07/01/19 1334  BP: 138/82 135/77  Pulse: (!) 50 60  Resp: (!) 21 18  Temp:  36.4 C  SpO2: 100% 99%    Last Pain:  Vitals:   07/01/19 1400  TempSrc:   PainSc: 0-No pain                 Latera Mclin

## 2019-07-03 LAB — SURGICAL PATHOLOGY

## 2019-07-07 ENCOUNTER — Telehealth: Payer: Self-pay | Admitting: *Deleted

## 2019-07-07 NOTE — Progress Notes (Signed)
Patient Care Team: [name], [place] as PCP - General (Nurse Practitioner) Mauro Kaufmann, RN as Oncology Nurse Navigator Rockwell Germany, RN as Oncology Nurse Navigator Stark Klein, MD as Consulting Physician (General Surgery) Nicholas Lose, MD as Consulting Physician (Hematology and Oncology) Gery Pray, MD as Consulting Physician (Radiation Oncology)  DIAGNOSIS:    ICD-10-CM   1. Malignant neoplasm of upper-outer quadrant of right breast in female, estrogen receptor positive ([place])  C50.411    Z17.0     SUMMARY OF ONCOLOGIC HISTORY: Oncology History  Malignant neoplasm of upper-outer quadrant of right breast in female, estrogen receptor positive ([place])  06/13/2019 Initial Diagnosis   Screening mammogram detected a right breast mass, palpable on exam. Diagnostic mammogram showed a 1.0cm mass in the right breast at the 10:30 position. Biopsy showed invasive mammary carcinoma, grade 1, HER-2 equivocal by IHC, negative by FISH, ER+ 100%, PR+ 90%, Ki67 15%   06/18/2019 Cancer Staging   Staging form: Breast, AJCC 8th Edition - Clinical stage from 06/18/2019: cT1b, cN0, cM0, G2, ER+, PR+, HER2: Unknown - Signed by Nicholas Lose, MD on 06/18/2019   07/01/2019 Surgery   Right lumpectomy [facility]): IDC, 1.3cm, grade 2, clear margins, 2 right axillary lymph nodes negative.     CHIEF COMPLIANT: Follow-up s/p lumpectomy to review pathology   INTERVAL HISTORY: Carrie Serrano is a 69 y.o. with above-mentioned history of right breast cancer. She underwent a right lumpectomy with Dr. 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 She presents to the clinic today to review the pathology report and discuss further treatment.  She did extremely well from surgery standpoint without any pain or discomfort.  She has not taken any pain medication.  ALLERGIES:  is allergic to  latex.  MEDICATIONS:  Current Outpatient Medications  Medication Sig Dispense Refill  . Calcium Carbonate-Vit D-Min (CALCIUM 1200) 1200-1000 MG-UNIT CHEW Chew 1 tablet by mouth daily.    Marland Kitchen denosumab (PROLIA) 60 MG/ML SOLN injection Inject 60 mg into the skin every 6 (six) months. Administer in upper arm, thigh, or abdomen    . oxyCODONE (OXY IR/ROXICODONE) 5 MG immediate release tablet Take 1 tablet (5 mg total) by mouth every 6 (six) hours as needed for severe pain. 10 tablet 0  . valACYclovir (VALTREX) 500 MG tablet Take 500 mg by mouth 2 (two) times daily as needed. For outbreak    . vitamin B-12 (CYANOCOBALAMIN) 1000 MCG tablet Take 1,000 mcg by mouth daily.    . Vitamin D, Cholecalciferol, 400 units CAPS Take 800 Units by mouth.     No current facility-administered medications for this visit.    PHYSICAL EXAMINATION: ECOG PERFORMANCE STATUS: 1 - Symptomatic but completely ambulatory  Vitals:   07/08/19 0957  BP: 126/84  Pulse: 65  Resp: 17  Temp: 98 F (36.7 C)  SpO2: 99%   Filed Weights   07/08/19 0957  Weight: 122 lb 1.6 oz (55.4 kg)    LABORATORY DATA:  I have reviewed the data as listed CMP Latest Ref Rng & Units 06/18/2019 06/04/2012  Glucose 70 - 99 mg/dL 96 71  BUN 8 - 23 mg/dL 15 17  Creatinine 0.44 - 1.00 mg/dL 0.85 0.9  Sodium 135 - 145 mmol/L 141 140  Potassium 3.5 - 5.1 mmol/L 4.8 4.6  Chloride 98 - 111 mmol/L 107 105  CO2 22 - 32 mmol/L 26 28  Calcium 8.9 -  10.3 mg/dL 9.5 9.5  Total Protein 6.5 - 8.1 g/dL 6.8 -  Total Bilirubin 0.3 - 1.2 mg/dL 0.6 -  Alkaline Phos 38 - 126 U/L 75 -  AST 15 - 41 U/L 28 -  ALT 0 - 44 U/L 26 -    Lab Results  Component Value Date   WBC 5.2 06/18/2019   HGB 15.3 (H) 06/18/2019   HCT 45.4 06/18/2019   MCV 97.6 06/18/2019   PLT 225 06/18/2019   NEUTROABS 3.2 06/18/2019    ASSESSMENT & PLAN:  Malignant neoplasm of upper-outer quadrant of right breast in female, estrogen receptor positive ([place]) 07/01/2019: Right  lumpectomy ([place]): IDC, 1.3cm, grade 2, clear margins, 2 right axillary lymph nodes negative.  ER 100%, PR 90%, Ki-67 15%, HER-2 negative by FISH T1c N0 stage Ia  Pathology counseling: I discussed the final pathology report of the patient provided  a copy of this report. I discussed the margins as well as lymph node surgeries. We also discussed the final staging along with previously performed ER/PR and HER-2/neu testing.  Treatment plan: 1. Oncotype DX testing to determine if chemotherapy would be of any benefit followed by 2. Adjuvant radiation therapy (she would like to get radiation in [place]) followed by  3. Adjuvant antiestrogen therapy  Return to clinic based upon Oncotype test results    No orders of the defined types were placed in this encounter.  The patient has a good understanding of the overall plan. she agrees with it. she will call with any problems that may develop before the next visit here.  Total time spent: 20 mins including face to face time and time spent for planning, charting and coordination of care  Nicholas Lose, MD 07/08/2019  I, Cloyde Reams Dorshimer, am acting as scribe for Dr. Nicholas Lose.  I have reviewed the above documentation for accuracy and completeness, and I agree with the above.

## 2019-07-07 NOTE — Telephone Encounter (Signed)
Ordered oncotype per Dr. Gudena. Faxed requisition to pathology. °

## 2019-07-08 ENCOUNTER — Inpatient Hospital Stay: Payer: Medicare Other | Attending: Hematology and Oncology | Admitting: Hematology and Oncology

## 2019-07-08 ENCOUNTER — Other Ambulatory Visit: Payer: Self-pay

## 2019-07-08 DIAGNOSIS — C50411 Malignant neoplasm of upper-outer quadrant of right female breast: Secondary | ICD-10-CM | POA: Insufficient documentation

## 2019-07-08 DIAGNOSIS — Z170 Estrogen receptor positive status [ER+]: Secondary | ICD-10-CM | POA: Diagnosis not present

## 2019-07-08 NOTE — Assessment & Plan Note (Signed)
07/01/2019: Right lumpectomy [facility]): IDC, 1.3cm, grade 2, clear margins, 2 right axillary lymph nodes negative.  ER 100%, PR 90%, Ki-67 15%, HER-2 negative by FISH T1c N0 stage Ia  Pathology counseling: I discussed the final pathology report of the patient provided  a copy of this report. I discussed the margins as well as lymph node surgeries. We also discussed the final staging along with previously performed ER/PR and HER-2/neu testing.  Treatment plan: 1. Oncotype DX testing to determine if chemotherapy would be of any benefit followed by 2. Adjuvant radiation therapy followed by 3. Adjuvant antiestrogen therapy  Return to clinic based upon Oncotype test results

## 2019-07-09 ENCOUNTER — Telehealth: Payer: Self-pay

## 2019-07-09 NOTE — Telephone Encounter (Signed)
Nutrition  Patient identified as attended Breast Clinic on 06/18/19.    Chart reviewed.  Called patient to introduce self and service at [facility].  No answer. Left message with call back number.  Carrie Serrano, [place], [place] Registered Dietitian [phone] (pager)

## 2019-07-15 ENCOUNTER — Telehealth: Payer: Self-pay

## 2019-07-15 NOTE — Telephone Encounter (Signed)
Nutrition  Patient identified by attending Breast Clinic.  Patient returned RD's call.  Patient with right breast cancer, s/p lumpectomy on 07/01/19.  Past medical history reviewed.   Spoke with patient via phone.  Reports she is doing well, eating normally.  No questions or concerns regarding nutrition at this time.   Chart reviewed.    Patient has RD contact information and knows to reach out if needed.   Liannah Yarbough B. Zenia Resides, [place], [place] Registered Dietitian [phone] (pager)

## 2019-07-21 ENCOUNTER — Encounter: Payer: Self-pay | Admitting: Hematology and Oncology

## 2019-07-21 ENCOUNTER — Telehealth: Payer: Self-pay | Admitting: *Deleted

## 2019-07-21 DIAGNOSIS — C50411 Malignant neoplasm of upper-outer quadrant of right female breast: Secondary | ICD-10-CM

## 2019-07-21 DIAGNOSIS — Z170 Estrogen receptor positive status [ER+]: Secondary | ICD-10-CM

## 2019-07-21 NOTE — Telephone Encounter (Signed)
Received Oncotype score of 6/3%. Physician team notified. Called pt and discussed did not need chemo based on these results. Informed next step is xrt with Dr. Oren Bracket. Received verbal understanding. Denies further needs or questions.

## 2019-07-22 ENCOUNTER — Ambulatory Visit: Payer: Medicare Other | Attending: Internal Medicine

## 2019-07-22 ENCOUNTER — Encounter: Payer: Self-pay | Admitting: *Deleted

## 2019-07-22 DIAGNOSIS — Z23 Encounter for immunization: Secondary | ICD-10-CM

## 2019-07-22 NOTE — Progress Notes (Signed)
   Covid-19 Vaccination Clinic  Name:  Carrie Serrano    MRN: [id number] DOB: [birth year]  07/22/2019  Carrie Serrano was observed post Covid-19 immunization for 15 minutes without incident. She was provided with Vaccine Information Sheet and instruction to access the V-Safe system.   Carrie Serrano was instructed to call 911 with any severe reactions post vaccine: Marland Kitchen Difficulty breathing  . Swelling of face and throat  . A fast heartbeat  . A bad rash all over body  . Dizziness and weakness   Immunizations Administered    Name Date Dose VIS Date Route   Pfizer COVID-19 Vaccine 07/22/2019 10:09 AM 0.3 mL 04/11/2019 Intramuscular   Manufacturer: [employer]   Lot: [id number]   [place]: [id number]

## 2019-07-28 ENCOUNTER — Ambulatory Visit: Payer: Medicare Other | Attending: General Surgery | Admitting: Physical Therapy

## 2019-07-28 ENCOUNTER — Other Ambulatory Visit: Payer: Self-pay

## 2019-07-28 ENCOUNTER — Encounter: Payer: Self-pay | Admitting: Physical Therapy

## 2019-07-28 DIAGNOSIS — Z483 Aftercare following surgery for neoplasm: Secondary | ICD-10-CM

## 2019-07-28 DIAGNOSIS — Z170 Estrogen receptor positive status [ER+]: Secondary | ICD-10-CM | POA: Diagnosis present

## 2019-07-28 DIAGNOSIS — C50411 Malignant neoplasm of upper-outer quadrant of right female breast: Secondary | ICD-10-CM | POA: Insufficient documentation

## 2019-07-28 DIAGNOSIS — R293 Abnormal posture: Secondary | ICD-10-CM | POA: Insufficient documentation

## 2019-07-28 NOTE — Therapy (Signed)
[place], [place], [address] Phone: [phone]   Fax:  [phone]  Physical Therapy Treatment  Patient Details  Name: Carrie Serrano MRN: [id number] Date of Birth: 1950-12-19 Referring Provider (PT): Dr. Stark Klein   Encounter Date: 07/28/2019  PT End of Session - 07/28/19 1317    Visit Number  2    Number of Visits  2    PT Start Time  1200    PT Stop Time  1250    PT Time Calculation (min)  50 min    Activity Tolerance  Patient tolerated treatment well    Behavior During Therapy  [facility] for tasks assessed/performed       Past Medical History:  Diagnosis Date  . HSV-2 (herpes simplex virus 2) infection   . Osteopenia   . PAT (paroxysmal atrial tachycardia) ([place])   . SVT (supraventricular tachycardia) ([facility])     Past Surgical History:  Procedure Laterality Date  . ABDOMINAL HYSTERECTOMY    . ABLATION OF DYSRHYTHMIC FOCUS  06/13/2012   SVT    Dr  Lovena Le  . BREAST LUMPECTOMY WITH RADIOACTIVE SEED AND SENTINEL LYMPH NODE BIOPSY Right 07/01/2019   Procedure: RIGHT BREAST LUMPECTOMY WITH RADIOACTIVE SEED AND SENTINEL LYMPH NODE BIOPSY;  Surgeon: Stark Klein, MD;  Location: [facility];  Service: General;  Laterality: Right;  . COLONOSCOPY  2012  . COLONOSCOPY WITH PROPOFOL N/A 06/26/2016   Procedure: COLONOSCOPY WITH PROPOFOL;  Surgeon: Lollie Sails, MD;  Location: [facility] ENDOSCOPY;  Service: Endoscopy;  Laterality: N/A;  . ELECTROPHYSIOLOGY STUDY N/A 06/13/2012   Procedure: ELECTROPHYSIOLOGY STUDY;  Surgeon: Evans Lance, MD;  Location: [facility] CATH LAB;  Service: Cardiovascular;  Laterality: N/A;  . FLEXIBLE SIGMOIDOSCOPY N/A 01/24/2017   Procedure: FLEXIBLE SIGMOIDOSCOPY;  Surgeon: Toledo, Benay Pike, MD;  Location: [facility] ENDOSCOPY;  Service: Gastroenterology;  Laterality: N/A;  . SUPRAVENTRICULAR TACHYCARDIA ABLATION N/A 06/13/2012   Procedure: SUPRAVENTRICULAR TACHYCARDIA ABLATION;   Surgeon: Evans Lance, MD;  Location: [facility] CATH LAB;  Service: Cardiovascular;  Laterality: N/A;  . varicose vein injection in left lower leg      There were no vitals filed for this visit.  Subjective Assessment - 07/28/19 1156    Subjective  Patient underwent a right lumpectomy and sentinel node biopsy (2 negative nodes) on 07/01/2019. Her Oncotype was low but she will begin radiation 07/31/2019. She reports she is "back to normal" except for some right arm strength. She has an area just inferior to hre incision site which she reports her surgeon accidentally cauterized and burned her. (see photo)    Pertinent History  Patient was diagnosed on 06/12/2019 with right grade I-II invasive ductal carcinoma breast cancer. Patient underwent a right lumpectomy and sentinel node biopsy (2 negative nodes) on 07/01/2019. It is ER/PR positive and HER2 negative with a Ki67 of 15%. She has chronic edema in her right fingers, worst in middle finger for unknown reasons.    Patient Stated Goals  Make sure my arm is ok    Currently in Pain?  No/denies         [facility] PT Assessment - 07/28/19 0001      Assessment   Medical Diagnosis  s/p right lumpectomy and SLNB    Referring Provider (PT)  Dr. Stark Klein    Onset Date/Surgical Date  07/01/19    Hand Dominance  Right    Prior Therapy  Baselines      Precautions  Precautions  Other (comment)    Precaution Comments  right arm lymphedema risk      Restrictions   Weight Bearing Restrictions  No      Balance Screen   Has the patient fallen in the past 6 months  No    Has the patient had a decrease in activity level because of a fear of falling?   No    Is the patient reluctant to leave their home because of a fear of falling?   No      Home Environment   Living Environment  Private residence    Living Arrangements  Alone    Available Help at Discharge  Friend(s)      Prior Function   Level of [place]  Retired    Leisure   She does boot camp 3x/week and walks/runs daily for 1 hour      Cognition   Overall Cognitive Status  Within Functional Limits for tasks assessed      Observation/Other Assessments   Observations  L-Dex score -2.5. There is only 1 incision near right axilla which was for her SLN and her mass. There is an area just inferior to her incision that is a wound she reports is due to the surgeon accidentally cauterizing that area.      Posture/Postural Control   Posture/Postural Control  Postural limitations    Postural Limitations  Rounded Shoulders;Forward head      ROM / Strength   AROM / PROM / Strength  AROM      AROM   AROM Assessment Site  Shoulder    Right/Left Shoulder  Right    Right Shoulder Extension  56 Degrees    Right Shoulder Flexion  141 Degrees    Right Shoulder ABduction  152 Degrees    Right Shoulder Internal Rotation  60 Degrees    Right Shoulder External Rotation  88 Degrees      Strength   Overall Strength  Within functional limits for tasks performed        LYMPHEDEMA/ONCOLOGY QUESTIONNAIRE - 07/28/19 1200      Type   Cancer Type  Right breast cancer      Surgeries   Lumpectomy Date  07/01/19    Sentinel Lymph Node Biopsy Date  07/01/19    Number Lymph Nodes Removed  2      Treatment   Active Chemotherapy Treatment  No    Past Chemotherapy Treatment  No    Active Radiation Treatment  No    Past Radiation Treatment  No    Current Hormone Treatment  No    Past Hormone Therapy  No      What other symptoms do you have   Are you Having Heaviness or Tightness  No    Are you having Pain  No    Are you having pitting edema  No    Is it Hard or Difficult finding clothes that fit  No    Do you have infections  No    Is there Decreased scar mobility  No    Stemmer Sign  No      Lymphedema Assessments   Lymphedema Assessments  Upper extremities      Right Upper Extremity Lymphedema   10 cm Proximal to Olecranon Process  25 cm    Olecranon Process   23.9 cm    10 cm Proximal to Ulnar Styloid Process  22.2 cm  Just Proximal to Ulnar Styloid Process  15.5 cm    Across Hand at [employer]  19.7 cm    At [place] of 2nd Digit  6.1 cm      Left Upper Extremity Lymphedema   10 cm Proximal to Olecranon Process  24.9 cm    Olecranon Process  22.7 cm    10 cm Proximal to Ulnar Styloid Process  20.8 cm    Just Proximal to Ulnar Styloid Process  15.5 cm    Across Hand at [employer]  18.6 cm    At [place] of 2nd Digit  6.1 cm        Quick Dash - 07/28/19 0001    Open a tight or new jar  No difficulty    Do heavy household chores (wash walls, wash floors)  No difficulty    Carry a shopping bag or briefcase  No difficulty    Wash your back  No difficulty    Use a knife to cut food  No difficulty    Recreational activities in which you take some force or impact through your arm, shoulder, or hand (golf, hammering, tennis)  No difficulty    During the past week, to what extent has your arm, shoulder or hand problem interfered with your normal social activities with family, friends, neighbors, or groups?  Not at all    During the past week, to what extent has your arm, shoulder or hand problem limited your work or other regular daily activities  Not at all    Arm, shoulder, or hand pain.  None    Tingling (pins and needles) in your arm, shoulder, or hand  None    Difficulty Sleeping  No difficulty    DASH Score  0 %                     PT Education - 07/28/19 1317    Education Details  Closed chain flexion stretch; reviewed lympehdema risk reduction    Person(s) Educated  Patient    Methods  Explanation;Demonstration;Handout    Comprehension  Returned demonstration;Verbalized understanding          PT Long Term Goals - 07/28/19 1321      PT LONG TERM GOAL #1   Title  Patient will demonstrate she has regainde full shoulder ROM and function post operatively compared to baselines.    Time  8    Period  Weeks     Status  Achieved            Plan - 07/28/19 1317    Clinical Impression Statement  Patient is doing very well s/p right lumpectomy and SLNB. She had negative lymph nodes, does not need chemotherapy, and plans to begin radiation next week (sees Dr. Baruch Gouty 07/31/2019). She has an area of necrotic tissue just inferior to her incision that she reprots si due to being accidentally burned with the cauterization tool. That is healing but a photo was taken to assess for healing later if needed. Her shoulder ROM and function has returned to baseline and she has returned to working out at her gym. She has no signs of lymphedema but as a good candidate for the L-Dex screenings so that was completed today and she will be seen every 3 months for that. She plans to attend the ABC class on 08/04/2019 but otherwise has no PT needs at this time.    PT Treatment/Interventions  ADLs/Self Care Home  Management;Therapeutic exercise;Patient/family education    PT Next Visit Plan  D/C - L-Dex screens every 3 months    PT Home Exercise Plan  Closed chain shoulder flexion ROM HEP    Consulted and Agree with Plan of Care  Patient       Patient will benefit from skilled therapeutic intervention in order to improve the following deficits and impairments:  Postural dysfunction, Decreased range of motion, Pain, Impaired UE functional use, Decreased knowledge of precautions  Visit Diagnosis: Malignant neoplasm of upper-outer quadrant of right breast in female, estrogen receptor positive ([place])  Abnormal posture  Aftercare following surgery for neoplasm  The patient was assessed using the L-Dex machine today to produce a lymphedema index baseline score. The patient will be reassessed on a regular basis (typically every 3 months) to obtain new L-Dex scores. If the score is > 6.5 points away from his/her baseline score indicating onset of subclinical lymphedema, it will be recommended to wear a compression garment for 4 weeks,  12 hours per day and then be reassessed. If the score continues to be > 6.5 points from baseline at reassessment, we will initiate lymphedema treatment. Assessing in this manner has a 95% rate of preventing clinically significant lymphedema.    Problem List Patient Active Problem List   Diagnosis Date Noted  . Malignant neoplasm of upper-outer quadrant of right breast in female, estrogen receptor positive ([place]) 06/13/2019  . Varicose veins of both lower extremities with complications 91/91/6606  . Chronic venous insufficiency 06/30/2016  . PSVT (paroxysmal supraventricular tachycardia) ([place]) 09/18/2014  . Right bundle branch block 09/18/2014  . RBBB 09/05/2013  . LAFB (left anterior fascicular block) 09/05/2013  . SVT (supraventricular tachycardia) ([place]) 12/08/2011  . Tobacco abuse 12/08/2011   PHYSICAL THERAPY DISCHARGE SUMMARY  Visits from Start of Care: 2  Current functional level related to goals / functional outcomes: Goals met. Shoulder flexion only slightly limited but will likely return to baseline with doing the closed chain flexion exercise.   Remaining deficits: None   Education / Equipment: HEP  Plan: Patient agrees to discharge.  Patient goals were met. Patient is being discharged due to meeting the stated rehab goals.  ?????         Annia Friendly, [place] 07/28/19 1:23 PM  [place], [place], [address] Phone: [phone]   Fax:  [phone]  Name: Carrie Serrano MRN: [id number] Date of Birth: [birth year]

## 2019-07-28 NOTE — Patient Instructions (Signed)
Closed Chain: Shoulder Flexion / Extension - on Wall    Hands on wall, step backward. Return. Stepping causes shoulder flexion and extension Do _5__ times, holding 5-10 seconds, _2-3__ times per day.  http://ss.exer.us/265   Copyright  VHI. All rights reserved.

## 2019-07-29 ENCOUNTER — Other Ambulatory Visit: Payer: Self-pay

## 2019-07-29 ENCOUNTER — Encounter: Payer: Self-pay | Admitting: Radiation Oncology

## 2019-07-31 ENCOUNTER — Ambulatory Visit
Admission: RE | Admit: 2019-07-31 | Discharge: 2019-07-31 | Disposition: A | Discharge status: Home / Self Care | Payer: Medicare Other | Source: Ambulatory Visit | Attending: Radiation Oncology | Admitting: Radiation Oncology

## 2019-07-31 ENCOUNTER — Other Ambulatory Visit: Payer: Self-pay

## 2019-07-31 ENCOUNTER — Encounter (HOSPITAL_COMMUNITY): Payer: Self-pay | Admitting: Hematology and Oncology

## 2019-07-31 DIAGNOSIS — I471 Supraventricular tachycardia: Secondary | ICD-10-CM | POA: Diagnosis not present

## 2019-07-31 DIAGNOSIS — C50411 Malignant neoplasm of upper-outer quadrant of right female breast: Secondary | ICD-10-CM

## 2019-07-31 DIAGNOSIS — Z170 Estrogen receptor positive status [ER+]: Secondary | ICD-10-CM | POA: Insufficient documentation

## 2019-07-31 DIAGNOSIS — Z87891 Personal history of nicotine dependence: Secondary | ICD-10-CM | POA: Insufficient documentation

## 2019-07-31 DIAGNOSIS — M8580 Other specified disorders of bone density and structure, unspecified site: Secondary | ICD-10-CM | POA: Diagnosis not present

## 2019-07-31 DIAGNOSIS — B009 Herpesviral infection, unspecified: Secondary | ICD-10-CM | POA: Diagnosis not present

## 2019-07-31 NOTE — Consult Note (Signed)
NEW PATIENT EVALUATION  Name: Carrie Serrano  MRN: [id number]  Date:   07/31/2019     DOB: July 15, 1950   This 69 y.o. 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  REFERRING PHYSICIAN: [name], PA  CHIEF COMPLAINT:  Chief Complaint  Patient presents with  . Breast Cancer    Initial consult    DIAGNOSIS: The encounter diagnosis was Malignant neoplasm of upper-outer quadrant of right breast in female, estrogen receptor positive ([place]).   PREVIOUS INVESTIGATIONS:  Mammogram and ultrasound reviewed Clinical notes reviewed Pathology report reviewed  HPI: Patient is a 69 year old female who presented with an abnormal mammogram of the right breast showing an ill-defined irregular mass with indistinct margins in the upper outer quadrant measuring 1 x 0.7 x 1 cm.  Lymph nodes were normal by ultrasound.  She underwent biopsy which was positive for ER/PR positive HER-2 negative invasive mammary carcinoma.  She went on to have a wide local excision showing 1.3 cm ER/PR positive invasive mammary carcinoma overall grade 2.  Margins were clear but close at less than 1 mm.  2 sentinel lymph nodes were negative for metastatic disease.  She is done well postoperatively.  She had Oncotype DX performed showing low risk of recurrence with no benefit for systemic chemotherapy.  She seen today for radiation oncology opinion she is doing well she still has a scab on her axillary scar otherwise healing is fine she specifically [place] breast tenderness cough or bone pain.  PLANNED TREATMENT REGIMEN: Hypofractionated right whole breast radiation  PAST MEDICAL HISTORY:  has a past medical history of HSV-2 (herpes simplex virus 2) infection, Osteopenia, PAT (paroxysmal atrial tachycardia) ([place]), and SVT (supraventricular tachycardia) ([place]).    PAST  SURGICAL HISTORY:  Past Surgical History:  Procedure Laterality Date  . ABDOMINAL HYSTERECTOMY    . ABLATION OF DYSRHYTHMIC FOCUS  06/13/2012   SVT    Dr  Lovena Le  . BREAST LUMPECTOMY WITH RADIOACTIVE SEED AND SENTINEL LYMPH NODE BIOPSY Right 07/01/2019   Procedure: RIGHT BREAST LUMPECTOMY WITH RADIOACTIVE SEED AND SENTINEL LYMPH NODE BIOPSY;  Surgeon: Stark Klein, MD;  Location: [place];  Service: General;  Laterality: Right;  . COLONOSCOPY  2012  . COLONOSCOPY WITH PROPOFOL N/A 06/26/2016   Procedure: COLONOSCOPY WITH PROPOFOL;  Surgeon: Lollie Sails, MD;  Location: [facility] ENDOSCOPY;  Service: Endoscopy;  Laterality: N/A;  . ELECTROPHYSIOLOGY STUDY N/A 06/13/2012   Procedure: ELECTROPHYSIOLOGY STUDY;  Surgeon: Evans Lance, MD;  Location: [facility] CATH LAB;  Service: Cardiovascular;  Laterality: N/A;  . FLEXIBLE SIGMOIDOSCOPY N/A 01/24/2017   Procedure: FLEXIBLE SIGMOIDOSCOPY;  Surgeon: Toledo, Benay Pike, MD;  Location: [facility] ENDOSCOPY;  Service: Gastroenterology;  Laterality: N/A;  . SUPRAVENTRICULAR TACHYCARDIA ABLATION N/A 06/13/2012   Procedure: SUPRAVENTRICULAR TACHYCARDIA ABLATION;  Surgeon: Evans Lance, MD;  Location: [facility] CATH LAB;  Service: Cardiovascular;  Laterality: N/A;  . varicose vein injection in left lower leg      FAMILY HISTORY: family history includes Diabetes in her mother; Heart attack in her father; Hypertension in her father; Stroke in her mother.  SOCIAL HISTORY:  reports that she quit smoking about 5 years ago. Her smoking use included cigarettes. She has a 12.50 pack-year smoking history. She has never used smokeless tobacco. She reports current alcohol use of about 8.0 - 10.0 standard drinks of alcohol per week.  She reports that she does not use drugs.  ALLERGIES: Latex  MEDICATIONS:  Current Outpatient Medications  Medication Sig Dispense Refill  . Calcium Carbonate-Vit D-Min (CALCIUM 1200) 1200-1000 MG-UNIT CHEW Chew 1 tablet by mouth daily.     Marland Kitchen denosumab (PROLIA) 60 MG/ML SOLN injection Inject 60 mg into the skin every 6 (six) months. Administer in upper arm, thigh, or abdomen    . valACYclovir (VALTREX) 500 MG tablet Take 500 mg by mouth 2 (two) times daily as needed. For outbreak    . vitamin B-12 (CYANOCOBALAMIN) 1000 MCG tablet Take 1,000 mcg by mouth daily.    . Vitamin D, Cholecalciferol, 400 units CAPS Take 800 Units by mouth.     No current facility-administered medications for this encounter.    ECOG PERFORMANCE STATUS:  0 - Asymptomatic  REVIEW OF SYSTEMS: Patient denies any weight loss, fatigue, weakness, fever, chills or night sweats. Patient denies any loss of vision, blurred vision. Patient denies any ringing  of the ears or hearing loss. No irregular heartbeat. Patient denies heart murmur or history of fainting. Patient denies any chest pain or pain radiating to her upper extremities. Patient denies any shortness of breath, difficulty breathing at night, cough or hemoptysis. Patient denies any swelling in the lower legs. Patient denies any nausea vomiting, vomiting of blood, or coffee ground material in the vomitus. Patient denies any stomach pain. Patient states has had normal bowel movements no significant constipation or diarrhea. Patient denies any dysuria, hematuria or significant nocturia. Patient denies any problems walking, swelling in the joints or loss of balance. Patient denies any skin changes, loss of hair or loss of weight. Patient denies any excessive worrying or anxiety or significant depression. Patient denies any problems with insomnia. Patient denies excessive thirst, polyuria, polydipsia. Patient denies any swollen glands, patient denies easy bruising or easy bleeding. Patient denies any recent infections, allergies or URI. Patient "s visual fields have not changed significantly in recent time.   PHYSICAL EXAM: BP (P) 121/76 (BP Location: Left Arm, Patient Position: Sitting)   Pulse (!) (P) 56   Temp  (!) (P) 94.1 F (34.5 C) (Tympanic)   Resp (P) 16   Wt (P) 122 lb 6.4 oz (55.5 kg)   BMI (P) 19.76 kg/m  Right breast has both wide local excision and axillary sentinel node biopsy scar both healing well there is still some scab formation on the axillary scar.  No dominant mass or nodularity is noted in either breast in 2 positions examined.  No axillary or supraclavicular adenopathy is appreciated.  Well-developed well-nourished patient in NAD. HEENT reveals PERLA, EOMI, discs not visualized.  Oral cavity is clear. No oral mucosal lesions are identified. Neck is clear without evidence of cervical or supraclavicular adenopathy. Lungs are clear to A&P. Cardiac examination is essentially unremarkable with regular rate and rhythm without murmur rub or thrill. Abdomen is benign with no organomegaly or masses noted. Motor sensory and DTR levels are equal and symmetric in the upper and lower extremities. Cranial nerves II through XII are grossly intact. Proprioception is intact. No peripheral adenopathy or edema is identified. No motor or sensory levels are noted. Crude visual fields are within normal range.  LABORATORY DATA: Pathology report reviewed    RADIOLOGY RESULTS: Mammogram and ultrasound reviewed   IMPRESSION: Stage I invasive mammary carcinoma ER/PR positive low Oncotype DX status post wide local excision and sentinel node biopsy and 69 year old female  PLAN: At this time okay with a 3-week hypofractionated course  of whole breast radiation.  I would also boost her scar another 1600 cGy in 8 fractions based on the close less than 1 mm margin.  Risks and benefits of treatment including skin reaction fatigue alteration of blood counts possible inclusion of superficial lung all were described in detail to the patient she seems to comprehend my treatment plan well.  I have personally set up and ordered CT simulation for next week.  Patient also will benefit from antiestrogen therapy after completion  of radiation.  I would like to take this opportunity to thank you for allowing me to participate in the care of your patient.Noreene Filbert, MD

## 2019-08-05 ENCOUNTER — Other Ambulatory Visit: Payer: Self-pay

## 2019-08-06 ENCOUNTER — Ambulatory Visit
Admission: RE | Admit: 2019-08-06 | Discharge: 2019-08-06 | Disposition: A | Discharge status: Home / Self Care | Payer: Medicare Other | Source: Ambulatory Visit | Attending: Radiation Oncology | Admitting: Radiation Oncology

## 2019-08-06 ENCOUNTER — Encounter: Payer: Self-pay | Admitting: *Deleted

## 2019-08-06 DIAGNOSIS — Z510 Encounter for antineoplastic radiation therapy: Secondary | ICD-10-CM | POA: Insufficient documentation

## 2019-08-06 DIAGNOSIS — C50411 Malignant neoplasm of upper-outer quadrant of right female breast: Secondary | ICD-10-CM | POA: Diagnosis present

## 2019-08-07 ENCOUNTER — Telehealth: Payer: Self-pay | Admitting: Hematology and Oncology

## 2019-08-07 NOTE — Telephone Encounter (Signed)
Scheduled 4/7 sch message - pt is aware of appt date and time

## 2019-08-08 DIAGNOSIS — Z510 Encounter for antineoplastic radiation therapy: Secondary | ICD-10-CM | POA: Diagnosis not present

## 2019-08-12 ENCOUNTER — Ambulatory Visit: Admission: RE | Admit: 2019-08-12 | Payer: Medicare Other | Source: Ambulatory Visit

## 2019-08-12 DIAGNOSIS — Z510 Encounter for antineoplastic radiation therapy: Secondary | ICD-10-CM | POA: Diagnosis not present

## 2019-08-13 ENCOUNTER — Ambulatory Visit
Admission: RE | Admit: 2019-08-13 | Discharge: 2019-08-13 | Disposition: A | Discharge status: Home / Self Care | Payer: Medicare Other | Source: Ambulatory Visit | Attending: Radiation Oncology | Admitting: Radiation Oncology

## 2019-08-13 DIAGNOSIS — Z510 Encounter for antineoplastic radiation therapy: Secondary | ICD-10-CM | POA: Diagnosis not present

## 2019-08-14 ENCOUNTER — Ambulatory Visit
Admission: RE | Admit: 2019-08-14 | Discharge: 2019-08-14 | Disposition: A | Discharge status: Home / Self Care | Payer: Medicare Other | Source: Ambulatory Visit | Attending: Radiation Oncology | Admitting: Radiation Oncology

## 2019-08-14 DIAGNOSIS — Z510 Encounter for antineoplastic radiation therapy: Secondary | ICD-10-CM | POA: Diagnosis not present

## 2019-08-15 ENCOUNTER — Ambulatory Visit
Admission: RE | Admit: 2019-08-15 | Discharge: 2019-08-15 | Disposition: A | Discharge status: Home / Self Care | Payer: Medicare Other | Source: Ambulatory Visit | Attending: Radiation Oncology | Admitting: Radiation Oncology

## 2019-08-15 DIAGNOSIS — Z510 Encounter for antineoplastic radiation therapy: Secondary | ICD-10-CM | POA: Diagnosis not present

## 2019-08-18 ENCOUNTER — Ambulatory Visit
Admission: RE | Admit: 2019-08-18 | Discharge: 2019-08-18 | Disposition: A | Discharge status: Home / Self Care | Payer: Medicare Other | Source: Ambulatory Visit | Attending: Radiation Oncology | Admitting: Radiation Oncology

## 2019-08-18 DIAGNOSIS — Z510 Encounter for antineoplastic radiation therapy: Secondary | ICD-10-CM | POA: Diagnosis not present

## 2019-08-19 ENCOUNTER — Ambulatory Visit
Admission: RE | Admit: 2019-08-19 | Discharge: 2019-08-19 | Disposition: A | Discharge status: Home / Self Care | Payer: Medicare Other | Source: Ambulatory Visit | Attending: Radiation Oncology | Admitting: Radiation Oncology

## 2019-08-19 DIAGNOSIS — Z510 Encounter for antineoplastic radiation therapy: Secondary | ICD-10-CM | POA: Diagnosis not present

## 2019-08-20 ENCOUNTER — Ambulatory Visit
Admission: RE | Admit: 2019-08-20 | Discharge: 2019-08-20 | Disposition: A | Discharge status: Home / Self Care | Payer: Medicare Other | Source: Ambulatory Visit | Attending: Radiation Oncology | Admitting: Radiation Oncology

## 2019-08-20 DIAGNOSIS — Z510 Encounter for antineoplastic radiation therapy: Secondary | ICD-10-CM | POA: Diagnosis not present

## 2019-08-21 ENCOUNTER — Ambulatory Visit
Admission: RE | Admit: 2019-08-21 | Discharge: 2019-08-21 | Disposition: A | Discharge status: Home / Self Care | Payer: Medicare Other | Source: Ambulatory Visit | Attending: Radiation Oncology | Admitting: Radiation Oncology

## 2019-08-21 DIAGNOSIS — Z510 Encounter for antineoplastic radiation therapy: Secondary | ICD-10-CM | POA: Diagnosis not present

## 2019-08-22 ENCOUNTER — Ambulatory Visit
Admission: RE | Admit: 2019-08-22 | Discharge: 2019-08-22 | Disposition: A | Discharge status: Home / Self Care | Payer: Medicare Other | Source: Ambulatory Visit | Attending: Radiation Oncology | Admitting: Radiation Oncology

## 2019-08-22 DIAGNOSIS — Z510 Encounter for antineoplastic radiation therapy: Secondary | ICD-10-CM | POA: Diagnosis not present

## 2019-08-25 ENCOUNTER — Ambulatory Visit
Admission: RE | Admit: 2019-08-25 | Discharge: 2019-08-25 | Disposition: A | Discharge status: Home / Self Care | Payer: Medicare Other | Source: Ambulatory Visit | Attending: Radiation Oncology | Admitting: Radiation Oncology

## 2019-08-25 DIAGNOSIS — Z510 Encounter for antineoplastic radiation therapy: Secondary | ICD-10-CM | POA: Diagnosis not present

## 2019-08-26 ENCOUNTER — Ambulatory Visit
Admission: RE | Admit: 2019-08-26 | Discharge: 2019-08-26 | Disposition: A | Discharge status: Home / Self Care | Payer: Medicare Other | Source: Ambulatory Visit | Attending: Radiation Oncology | Admitting: Radiation Oncology

## 2019-08-26 DIAGNOSIS — Z510 Encounter for antineoplastic radiation therapy: Secondary | ICD-10-CM | POA: Diagnosis not present

## 2019-08-27 ENCOUNTER — Ambulatory Visit
Admission: RE | Admit: 2019-08-27 | Discharge: 2019-08-27 | Disposition: A | Discharge status: Home / Self Care | Payer: Medicare Other | Source: Ambulatory Visit | Attending: Radiation Oncology | Admitting: Radiation Oncology

## 2019-08-27 ENCOUNTER — Other Ambulatory Visit: Payer: Self-pay

## 2019-08-27 ENCOUNTER — Inpatient Hospital Stay: Payer: Medicare Other | Attending: Radiation Oncology

## 2019-08-27 DIAGNOSIS — Z510 Encounter for antineoplastic radiation therapy: Secondary | ICD-10-CM | POA: Diagnosis not present

## 2019-08-27 DIAGNOSIS — C50411 Malignant neoplasm of upper-outer quadrant of right female breast: Secondary | ICD-10-CM | POA: Insufficient documentation

## 2019-08-27 DIAGNOSIS — Z170 Estrogen receptor positive status [ER+]: Secondary | ICD-10-CM

## 2019-08-27 LAB — CBC
HCT: 42 % (ref 36.0–46.0)
Hemoglobin: 14.4 g/dL (ref 12.0–15.0)
MCH: 33.4 pg (ref 26.0–34.0)
MCHC: 34.3 g/dL (ref 30.0–36.0)
MCV: 97.4 fL (ref 80.0–100.0)
Platelets: 203 10*3/uL (ref 150–400)
RBC: 4.31 MIL/uL (ref 3.87–5.11)
RDW: 12.4 % (ref 11.5–15.5)
WBC: 6.4 10*3/uL (ref 4.0–10.5)
nRBC: 0 % (ref 0.0–0.2)

## 2019-08-28 ENCOUNTER — Ambulatory Visit
Admission: RE | Admit: 2019-08-28 | Discharge: 2019-08-28 | Disposition: A | Discharge status: Home / Self Care | Payer: Medicare Other | Source: Ambulatory Visit | Attending: Radiation Oncology | Admitting: Radiation Oncology

## 2019-08-28 DIAGNOSIS — Z510 Encounter for antineoplastic radiation therapy: Secondary | ICD-10-CM | POA: Diagnosis not present

## 2019-08-29 ENCOUNTER — Ambulatory Visit (INDEPENDENT_AMBULATORY_CARE_PROVIDER_SITE_OTHER): Payer: Medicare Other | Admitting: Cardiology

## 2019-08-29 ENCOUNTER — Encounter: Payer: Self-pay | Admitting: Cardiology

## 2019-08-29 ENCOUNTER — Other Ambulatory Visit: Payer: Self-pay

## 2019-08-29 ENCOUNTER — Ambulatory Visit
Admission: RE | Admit: 2019-08-29 | Discharge: 2019-08-29 | Disposition: A | Discharge status: Home / Self Care | Payer: Medicare Other | Source: Ambulatory Visit | Attending: Radiation Oncology | Admitting: Radiation Oncology

## 2019-08-29 VITALS — BP 100/50 | HR 70 | Ht 66.0 in | Wt 124.0 lb

## 2019-08-29 DIAGNOSIS — Z510 Encounter for antineoplastic radiation therapy: Secondary | ICD-10-CM | POA: Diagnosis not present

## 2019-08-29 DIAGNOSIS — I452 Bifascicular block: Secondary | ICD-10-CM

## 2019-08-29 DIAGNOSIS — I471 Supraventricular tachycardia: Secondary | ICD-10-CM | POA: Diagnosis not present

## 2019-08-29 NOTE — Progress Notes (Signed)
Cardiology Office Note:    Date:  08/29/2019   ID:  Carrie Serrano, DOB 02-Feb-1951, MRN [id number]  PCP:  [name], PA  Cardiologist:  Candee Furbish, MD  Electrophysiologist:  None   Referring MD: [name], PA     History of Present Illness:    Carrie Serrano is a 69 y.o. female here for the follow-up of PSVT/PAT post ablation in 2014 Dr. Lovena Le.  Review of prior note: On 04/17/11 her EKG strip looked AVNRT and a heart rate of 160-170. On another visit she brought me another strip of her going into her arrhythmia. I do not see the onset of the arrhythmia but when compared to prior strip, her heart rate is actually in the 140s. I do not see a clear P-wave preceding the QRS complexes. She does terminate with a PVC. The remaining beats are in sinus rhythm.  EPS/ablation by Dr. Crissie Sickles in 2014, questionable success in lab but clinically she has improved dramatically.  She had very difficult anatomy.  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  Overall as she has been doing fairly well.  She did recently however become diagnosed with right upper quadrant breast cancer.  Had lumpectomy.  She is undergoing radiation.  She is doing very well.  Still exercising very vigorously.  Past Medical History:  Diagnosis Date  . HSV-2 (herpes simplex virus 2) infection   . Osteopenia   . PAT (paroxysmal atrial tachycardia) ([place])   . SVT (supraventricular tachycardia) ([facility])     Past Surgical History:  Procedure Laterality Date  . ABDOMINAL HYSTERECTOMY    . ABLATION OF DYSRHYTHMIC FOCUS  06/13/2012   SVT    Dr  Lovena Le  . BREAST LUMPECTOMY WITH RADIOACTIVE SEED AND SENTINEL LYMPH NODE BIOPSY Right 07/01/2019   Procedure: RIGHT BREAST LUMPECTOMY WITH RADIOACTIVE SEED AND SENTINEL LYMPH NODE BIOPSY;  Surgeon: Stark Klein, MD;  Location: [place];  Service:  General;  Laterality: Right;  . COLONOSCOPY  2012  . COLONOSCOPY WITH PROPOFOL N/A 06/26/2016   Procedure: COLONOSCOPY WITH PROPOFOL;  Surgeon: Lollie Sails, MD;  Location: [facility] ENDOSCOPY;  Service: Endoscopy;  Laterality: N/A;  . ELECTROPHYSIOLOGY STUDY N/A 06/13/2012   Procedure: ELECTROPHYSIOLOGY STUDY;  Surgeon: Evans Lance, MD;  Location: [facility] CATH LAB;  Service: Cardiovascular;  Laterality: N/A;  . FLEXIBLE SIGMOIDOSCOPY N/A 01/24/2017   Procedure: FLEXIBLE SIGMOIDOSCOPY;  Surgeon: Toledo, Benay Pike, MD;  Location: [facility] ENDOSCOPY;  Service: Gastroenterology;  Laterality: N/A;  . SUPRAVENTRICULAR TACHYCARDIA ABLATION N/A 06/13/2012   Procedure: SUPRAVENTRICULAR TACHYCARDIA ABLATION;  Surgeon: Evans Lance, MD;  Location: [facility] CATH LAB;  Service: Cardiovascular;  Laterality: N/A;  . varicose vein injection in left lower leg      Current Medications: Current Meds  Medication Sig  . Calcium Carbonate-Vit D-Min (CALCIUM 1200) 1200-1000 MG-UNIT CHEW Chew 1 tablet by mouth daily.  Marland Kitchen denosumab (PROLIA) 60 MG/ML SOLN injection Inject 60 mg into the skin every 6 (six) months. Administer in upper arm, thigh, or abdomen  . valACYclovir (VALTREX) 500 MG tablet Take 500 mg by mouth 2 (two) times daily as needed. For outbreak  . vitamin B-12 (CYANOCOBALAMIN) 1000 MCG tablet Take 1,000 mcg by mouth daily.  . Vitamin D, Cholecalciferol, 400 units CAPS Take 800 Units by mouth.     Allergies:   Latex   Social History   Socioeconomic History  . Marital  status: Single    Spouse name: Not on file  . Number of children: Not on file  . Years of education: Not on file  . Highest education level: Not on file  Occupational History  . Not on file  Tobacco Use  . Smoking status: Former Smoker    Packs/day: 0.50    Years: 25.00    Pack years: 12.50    Types: Cigarettes    Quit date: 05/01/2014    Years since quitting: 5.3  . Smokeless tobacco: Never Used  . Tobacco comment: 5 cigarettes a day.    Substance and Sexual Activity  . Alcohol use: Yes    Alcohol/week: 8.0 - 10.0 standard drinks    Types: 1 Cans of beer, 7 - 9 Standard drinks or equivalent per week    Comment: one beer a day  . Drug use: No  . Sexual activity: Not on file  Other Topics Concern  . Not on file  Social History Narrative  . Not on file   Social Determinants of Health   Financial Resource Strain:   . Difficulty of Paying Living Expenses:   Food Insecurity:   . Worried About Charity fundraiser in the Last Year:   . Arboriculturist in the Last Year:   Transportation Needs:   . Film/video editor (Medical):   Marland Kitchen Lack of Transportation (Non-Medical):   Physical Activity:   . Days of Exercise per Week:   . Minutes of Exercise per Session:   Stress:   . Feeling of Stress :   Social Connections:   . Frequency of Communication with Friends and Family:   . Frequency of Social Gatherings with Friends and Family:   . Attends Religious Services:   . Active Member of Clubs or Organizations:   . Attends Archivist Meetings:   Marland Kitchen Marital Status:      Family History: The patient's family history includes Diabetes in her mother; Heart attack in her father; Hypertension in her father; Stroke in her mother.  ROS:   Please see the history of present illness.     All other systems reviewed and are negative.  EKGs/Labs/Other Studies Reviewed:    The following studies were reviewed today: Prior EF normal  EKG: Recent EKG in March 2020 showed sinus rhythm right bundle branch block left anterior fascicular block with PAC.  No significant change from prior.  Recent Labs: 06/18/2019: ALT 26; BUN 15; Creatinine 0.85; Potassium 4.8; Sodium 141 08/27/2019: Hemoglobin 14.4; Platelets 203  Recent Lipid Panel No results found for: CHOL, TRIG, HDL, CHOLHDL, VLDL, LDLCALC, LDLDIRECT  Physical Exam:    VS:  BP (!) 100/50   Pulse 70   Ht 5\' 6"  (1.676 m)   Wt 124 lb (56.2 kg)   SpO2 96%   BMI 20.01  kg/m     Wt Readings from Last 3 Encounters:  08/29/19 124 lb (56.2 kg)  07/31/19 (P) 122 lb 6.4 oz (55.5 kg)  07/08/19 122 lb 1.6 oz (55.4 kg)     GEN:  Thin, well developed in no acute distress HEENT: Normal NECK: No JVD; No carotid bruits LYMPHATICS: No lymphadenopathy CARDIAC: RRR, no murmurs, rubs, gallops RESPIRATORY:  Clear to auscultation without rales, wheezing or rhonchi  ABDOMEN: Soft, non-tender, non-distended MUSCULOSKELETAL:  No edema; No deformity  SKIN: Warm and dry NEUROLOGIC:  Alert and oriented x 3 PSYCHIATRIC:  Normal affect   ASSESSMENT:    1. SVT (supraventricular tachycardia) ([place])  2. Bifascicular block    PLAN:    In order of problems listed above:  PSVT/PAT -Doing well post ablation.  No further symptoms.  She is off of the low-dose Toprol at night.  No longer taking any beta-blocker.  Denies any problems with heavy exercise.  Right bundle branch block/left anterior fascicular block/bifascicular block -No high risk symptoms such as syncope.  Continue to monitor.  Prior tobacco cessation for close to 7 years.  Excellent.  Right upper quadrant breast cancer -Undergoing radiation currently.  Overall been doing quite well with this.   Medication Adjustments/Labs and Tests Ordered: Current medicines are reviewed at length with the patient today.  Concerns regarding medicines are outlined above.  No orders of the defined types were placed in this encounter.  No orders of the defined types were placed in this encounter.   Patient Instructions  Medication Instructions:  The current medical regimen is effective;  continue present plan and medications.  *If you need a refill on your cardiac medications before your next appointment, please call your pharmacy*  Follow-Up: At [facility], you and your health needs are our priority.  As part of our continuing mission to provide you with exceptional heart care, we have created designated Provider  Care Teams.  These Care Teams include your primary Cardiologist (physician) and Advanced Practice Providers (APPs -  Physician Assistants and Nurse Practitioners) who all work together to provide you with the care you need, when you need it.  We recommend signing up for the patient portal called "MyChart".  Sign up information is provided on this After Visit Summary.  MyChart is used to connect with patients for Virtual Visits (Telemedicine).  Patients are able to view lab/test results, encounter notes, upcoming appointments, etc.  Non-urgent messages can be sent to your provider as well.   To learn more about what you can do with MyChart, go to [email or web address].    Your next appointment:   12 month(s)  The format for your next appointment:   In Person  Provider:   Candee Furbish, MD  Thank you for choosing [facility]!!        Signed, Candee Furbish, MD  08/29/2019 1:43 PM    [place]

## 2019-08-29 NOTE — Patient Instructions (Signed)
Medication Instructions:  The current medical regimen is effective;  continue present plan and medications.  *If you need a refill on your cardiac medications before your next appointment, please call your pharmacy*  Follow-Up: At [facility], you and your health needs are our priority.  As part of our continuing mission to provide you with exceptional heart care, we have created designated Provider Care Teams.  These Care Teams include your primary Cardiologist (physician) and Advanced Practice Providers (APPs -  Physician Assistants and Nurse Practitioners) who all work together to provide you with the care you need, when you need it.  We recommend signing up for the patient portal called "MyChart".  Sign up information is provided on this After Visit Summary.  MyChart is used to connect with patients for Virtual Visits (Telemedicine).  Patients are able to view lab/test results, encounter notes, upcoming appointments, etc.  Non-urgent messages can be sent to your provider as well.   To learn more about what you can do with MyChart, go to [email or web address].    Your next appointment:   12 month(s)  The format for your next appointment:   In Person  Provider:   Mark Skains, MD   Thank you for choosing [facility]!!      

## 2019-09-01 ENCOUNTER — Ambulatory Visit
Admission: RE | Admit: 2019-09-01 | Discharge: 2019-09-01 | Disposition: A | Discharge status: Home / Self Care | Payer: Medicare Other | Source: Ambulatory Visit | Attending: Radiation Oncology | Admitting: Radiation Oncology

## 2019-09-01 DIAGNOSIS — Z510 Encounter for antineoplastic radiation therapy: Secondary | ICD-10-CM | POA: Diagnosis not present

## 2019-09-01 DIAGNOSIS — C50411 Malignant neoplasm of upper-outer quadrant of right female breast: Secondary | ICD-10-CM | POA: Diagnosis present

## 2019-09-02 ENCOUNTER — Other Ambulatory Visit: Payer: Self-pay

## 2019-09-02 ENCOUNTER — Ambulatory Visit
Admission: RE | Admit: 2019-09-02 | Discharge: 2019-09-02 | Disposition: A | Discharge status: Home / Self Care | Payer: Medicare Other | Source: Ambulatory Visit | Attending: Radiation Oncology | Admitting: Radiation Oncology

## 2019-09-02 ENCOUNTER — Ambulatory Visit
Admission: RE | Admit: 2019-09-02 | Discharge: 2019-09-02 | Disposition: A | Discharge status: Home / Self Care | Payer: Medicare Other | Source: Ambulatory Visit | Attending: Physician Assistant | Admitting: Physician Assistant

## 2019-09-02 DIAGNOSIS — M810 Age-related osteoporosis without current pathological fracture: Secondary | ICD-10-CM

## 2019-09-02 DIAGNOSIS — Z510 Encounter for antineoplastic radiation therapy: Secondary | ICD-10-CM | POA: Diagnosis not present

## 2019-09-03 ENCOUNTER — Ambulatory Visit
Admission: RE | Admit: 2019-09-03 | Discharge: 2019-09-03 | Disposition: A | Discharge status: Home / Self Care | Payer: Medicare Other | Source: Ambulatory Visit | Attending: Radiation Oncology | Admitting: Radiation Oncology

## 2019-09-03 DIAGNOSIS — Z510 Encounter for antineoplastic radiation therapy: Secondary | ICD-10-CM | POA: Diagnosis not present

## 2019-09-04 ENCOUNTER — Ambulatory Visit
Admission: RE | Admit: 2019-09-04 | Discharge: 2019-09-04 | Disposition: A | Discharge status: Home / Self Care | Payer: Medicare Other | Source: Ambulatory Visit | Attending: Radiation Oncology | Admitting: Radiation Oncology

## 2019-09-04 DIAGNOSIS — Z510 Encounter for antineoplastic radiation therapy: Secondary | ICD-10-CM | POA: Diagnosis not present

## 2019-09-05 ENCOUNTER — Ambulatory Visit
Admission: RE | Admit: 2019-09-05 | Discharge: 2019-09-05 | Disposition: A | Discharge status: Home / Self Care | Payer: Medicare Other | Source: Ambulatory Visit | Attending: Radiation Oncology | Admitting: Radiation Oncology

## 2019-09-05 DIAGNOSIS — Z510 Encounter for antineoplastic radiation therapy: Secondary | ICD-10-CM | POA: Diagnosis not present

## 2019-09-08 ENCOUNTER — Ambulatory Visit
Admission: RE | Admit: 2019-09-08 | Discharge: 2019-09-08 | Disposition: A | Discharge status: Home / Self Care | Payer: Medicare Other | Source: Ambulatory Visit | Attending: Radiation Oncology | Admitting: Radiation Oncology

## 2019-09-08 DIAGNOSIS — Z510 Encounter for antineoplastic radiation therapy: Secondary | ICD-10-CM | POA: Diagnosis not present

## 2019-09-09 ENCOUNTER — Other Ambulatory Visit: Payer: Self-pay | Admitting: *Deleted

## 2019-09-09 ENCOUNTER — Ambulatory Visit
Admission: RE | Admit: 2019-09-09 | Discharge: 2019-09-09 | Disposition: A | Discharge status: Home / Self Care | Payer: Medicare Other | Source: Ambulatory Visit | Attending: Radiation Oncology | Admitting: Radiation Oncology

## 2019-09-09 DIAGNOSIS — Z510 Encounter for antineoplastic radiation therapy: Secondary | ICD-10-CM | POA: Diagnosis not present

## 2019-09-09 DIAGNOSIS — Z170 Estrogen receptor positive status [ER+]: Secondary | ICD-10-CM

## 2019-09-09 DIAGNOSIS — C50411 Malignant neoplasm of upper-outer quadrant of right female breast: Secondary | ICD-10-CM

## 2019-09-10 ENCOUNTER — Ambulatory Visit
Admission: RE | Admit: 2019-09-10 | Discharge: 2019-09-10 | Disposition: A | Discharge status: Home / Self Care | Payer: Medicare Other | Source: Ambulatory Visit | Attending: Radiation Oncology | Admitting: Radiation Oncology

## 2019-09-10 ENCOUNTER — Inpatient Hospital Stay: Payer: Medicare Other | Attending: Radiation Oncology

## 2019-09-10 ENCOUNTER — Other Ambulatory Visit: Payer: Self-pay

## 2019-09-10 DIAGNOSIS — Z510 Encounter for antineoplastic radiation therapy: Secondary | ICD-10-CM | POA: Diagnosis not present

## 2019-09-10 DIAGNOSIS — C50411 Malignant neoplasm of upper-outer quadrant of right female breast: Secondary | ICD-10-CM | POA: Diagnosis not present

## 2019-09-10 LAB — CBC
HCT: 42.8 % (ref 36.0–46.0)
Hemoglobin: 14.5 g/dL (ref 12.0–15.0)
MCH: 33.1 pg (ref 26.0–34.0)
MCHC: 33.9 g/dL (ref 30.0–36.0)
MCV: 97.7 fL (ref 80.0–100.0)
Platelets: 206 10*3/uL (ref 150–400)
RBC: 4.38 MIL/uL (ref 3.87–5.11)
RDW: 12.6 % (ref 11.5–15.5)
WBC: 5.2 10*3/uL (ref 4.0–10.5)
nRBC: 0 % (ref 0.0–0.2)

## 2019-09-11 ENCOUNTER — Ambulatory Visit
Admission: RE | Admit: 2019-09-11 | Discharge: 2019-09-11 | Disposition: A | Discharge status: Home / Self Care | Payer: Medicare Other | Source: Ambulatory Visit | Attending: Radiation Oncology | Admitting: Radiation Oncology

## 2019-09-11 DIAGNOSIS — Z510 Encounter for antineoplastic radiation therapy: Secondary | ICD-10-CM | POA: Diagnosis not present

## 2019-09-12 ENCOUNTER — Ambulatory Visit
Admission: RE | Admit: 2019-09-12 | Discharge: 2019-09-12 | Disposition: A | Discharge status: Home / Self Care | Payer: Medicare Other | Source: Ambulatory Visit | Attending: Radiation Oncology | Admitting: Radiation Oncology

## 2019-09-12 DIAGNOSIS — Z510 Encounter for antineoplastic radiation therapy: Secondary | ICD-10-CM | POA: Diagnosis not present

## 2019-09-15 ENCOUNTER — Ambulatory Visit
Admission: RE | Admit: 2019-09-15 | Discharge: 2019-09-15 | Disposition: A | Discharge status: Home / Self Care | Payer: Medicare Other | Source: Ambulatory Visit | Attending: Radiation Oncology | Admitting: Radiation Oncology

## 2019-09-15 ENCOUNTER — Encounter: Payer: Self-pay | Admitting: *Deleted

## 2019-09-15 DIAGNOSIS — Z510 Encounter for antineoplastic radiation therapy: Secondary | ICD-10-CM | POA: Diagnosis not present

## 2019-09-17 NOTE — Progress Notes (Signed)
Patient Care Team: [name], [place] as PCP - General (Nurse Practitioner) Jerline Pain, MD as PCP - Cardiology (Cardiology) Mauro Kaufmann, RN as Oncology Nurse Navigator Rockwell Germany, RN as Oncology Nurse Navigator Stark Klein, MD as Consulting Physician (General Surgery) Nicholas Lose, MD as Consulting Physician (Hematology and Oncology) Gery Pray, MD as Consulting Physician (Radiation Oncology) Noreene Filbert, MD as Radiation Oncologist (Radiation Oncology)  DIAGNOSIS:    ICD-10-CM   1. Malignant neoplasm of upper-outer quadrant of right breast in female, estrogen receptor positive ([place])  C50.411    Z17.0     SUMMARY OF ONCOLOGIC HISTORY: Oncology History  Malignant neoplasm of upper-outer quadrant of right breast in female, estrogen receptor positive ([place])  06/13/2019 Initial Diagnosis   Screening mammogram detected a right breast mass, palpable on exam. Diagnostic mammogram showed a 1.0cm mass in the right breast at the 10:30 position. Biopsy showed invasive mammary carcinoma, grade 1, HER-2 equivocal by IHC, negative by FISH, ER+ 100%, PR+ 90%, Ki67 15%   06/18/2019 Cancer Staging   Staging form: Breast, AJCC 8th Edition - Clinical stage from 06/18/2019: cT1b, cN0, cM0, G2, ER+, PR+, HER2: Unknown - Signed by Nicholas Lose, MD on 06/18/2019   07/01/2019 Surgery   Right lumpectomy [facility]): IDC, 1.3cm, grade 2, clear margins, 2 right axillary lymph nodes negative.   07/01/2019 Cancer Staging   Staging form: Breast, AJCC 8th Edition - Pathologic stage from 07/01/2019: Stage IA (pT1c, pN0, cM0, G2, ER+, PR+, HER2-) - Signed by Gardenia Phlegm, NP on 07/16/2019   08/12/2019 - 09/15/2019 Radiation Therapy   Adjuvant radiation     CHIEF COMPLIANT: Follow-up to discuss antiestrogen therapy  INTERVAL HISTORY: Carrie Serrano is a 69 y.o. with above-mentioned history of right breast cancer who underwent a lumpectomy and completed radiation treatment on 09/15/19.  Bone density scan on 09/02/19 showed osteopenia with a T-score of -1.7 at the right femur neck. She presents to the clinic today to discuss antiestrogen therapy.   ALLERGIES:  is allergic to latex.  MEDICATIONS:  Current Outpatient Medications  Medication Sig Dispense Refill  . Calcium Carbonate-Vit D-Min (CALCIUM 1200) 1200-1000 MG-UNIT CHEW Chew 1 tablet by mouth daily.    Marland Kitchen denosumab (PROLIA) 60 MG/ML SOLN injection Inject 60 mg into the skin every 6 (six) months. Administer in upper arm, thigh, or abdomen    . valACYclovir (VALTREX) 500 MG tablet Take 500 mg by mouth 2 (two) times daily as needed. For outbreak    . vitamin B-12 (CYANOCOBALAMIN) 1000 MCG tablet Take 1,000 mcg by mouth daily.    . Vitamin D, Cholecalciferol, 400 units CAPS Take 800 Units by mouth.     No current facility-administered medications for this visit.    PHYSICAL EXAMINATION: ECOG PERFORMANCE STATUS: 1 - Symptomatic but completely ambulatory  There were no vitals filed for this visit. There were no vitals filed for this visit.  LABORATORY DATA:  I have reviewed the data as listed CMP Latest Ref Rng & Units 06/18/2019 06/04/2012  Glucose 70 - 99 mg/dL 96 71  BUN 8 - 23 mg/dL 15 17  Creatinine 0.44 - 1.00 mg/dL 0.85 0.9  Sodium 135 - 145 mmol/L 141 140  Potassium 3.5 - 5.1 mmol/L 4.8 4.6  Chloride 98 - 111 mmol/L 107 105  CO2 22 - 32 mmol/L 26 28  Calcium 8.9 - 10.3 mg/dL 9.5 9.5  Total Protein 6.5 - 8.1 g/dL 6.8 -  Total Bilirubin 0.3 - 1.2 mg/dL 0.6 -  Alkaline Phos 38 - 126 U/L 75 -  AST 15 - 41 U/L 28 -  ALT 0 - 44 U/L 26 -    Lab Results  Component Value Date   WBC 5.2 09/10/2019   HGB 14.5 09/10/2019   HCT 42.8 09/10/2019   MCV 97.7 09/10/2019   PLT 206 09/10/2019   NEUTROABS 3.2 06/18/2019    ASSESSMENT & PLAN:  Malignant neoplasm of upper-outer quadrant of right breast in female, estrogen receptor positive ([place]) 07/01/2019: Right lumpectomy ([place]): IDC, 1.3cm, grade 2, clear margins, 2  right axillary lymph nodes negative.  ER 100%, PR 90%, Ki-67 15%, HER-2 negative by FISH T1c N0 stage Ia Oncotype DX score 6: Distant recurrence at 9 years: 3% Adjuvant radiation 08/12/2019-09/15/2019  Treatment plan: Adjuvant antiestrogen therapy with anastrozole 1 mg daily x5 to 7 years  Anastrozole counseling:We discussed the risks and benefits of anti-estrogen therapy with aromatase inhibitors. These include but not limited to insomnia, hot flashes, mood changes, vaginal dryness, bone density loss, and weight gain. We strongly believe that the benefits far outweigh the risks. Patient understands these risks and consented to starting treatment. Planned treatment duration is 5-7 years.   She will start anastrozole 09/30/2019. Return to clinic in 3 months for survivorship care plan visit. Patient agreed to participate in antiestrogen therapy adherence and compliance study.  No orders of the defined types were placed in this encounter.  The patient has a good understanding of the overall plan. she agrees with it. she will call with any problems that may develop before the next visit here.  Total time spent: 30 mins including face to face time and time spent for planning, charting and coordination of care  Nicholas Lose, MD 09/18/2019  I, Cloyde Reams Dorshimer, am acting as scribe for Dr. Nicholas Lose.  I have reviewed the above documentation for accuracy and completeness, and I agree with the above.

## 2019-09-18 ENCOUNTER — Other Ambulatory Visit: Payer: Self-pay

## 2019-09-18 ENCOUNTER — Other Ambulatory Visit: Payer: Self-pay | Admitting: Hematology and Oncology

## 2019-09-18 ENCOUNTER — Inpatient Hospital Stay: Payer: Medicare Other | Attending: Hematology and Oncology | Admitting: Hematology and Oncology

## 2019-09-18 DIAGNOSIS — M85851 Other specified disorders of bone density and structure, right thigh: Secondary | ICD-10-CM | POA: Insufficient documentation

## 2019-09-18 DIAGNOSIS — Z170 Estrogen receptor positive status [ER+]: Secondary | ICD-10-CM | POA: Diagnosis not present

## 2019-09-18 DIAGNOSIS — C50411 Malignant neoplasm of upper-outer quadrant of right female breast: Secondary | ICD-10-CM | POA: Insufficient documentation

## 2019-09-18 MED ORDER — ANASTROZOLE 1 MG PO TABS: 1.0000 mg | ORAL_TABLET | Freq: Every day | ORAL | 3 refills | Status: DC

## 2019-09-18 NOTE — Assessment & Plan Note (Signed)
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  ER 100%, PR 90%, Ki-67 15%, HER-2 negative by FISH T1c N0 stage Ia Oncotype DX score 6: Distant recurrence at 9 years: 3% Adjuvant radiation 08/12/2019-09/15/2019  Treatment plan: Adjuvant antiestrogen therapy with anastrozole 1 mg daily x5 to 7 years  Anastrozole counseling:We discussed the risks and benefits of anti-estrogen therapy with aromatase inhibitors. These include but not limited to insomnia, hot flashes, mood changes, vaginal dryness, bone density loss, and weight gain. We strongly believe that the benefits far outweigh the risks. Patient understands these risks and consented to starting treatment. Planned treatment duration is 5-7 years.

## 2019-09-19 ENCOUNTER — Telehealth: Payer: Self-pay | Admitting: Adult Health

## 2019-09-19 NOTE — Telephone Encounter (Signed)
Called and talked to patient about AET study.  She will call me back.  If needed we will schedule a no charge visit for her next week so I can look at her phone.

## 2019-09-24 ENCOUNTER — Telehealth: Payer: Self-pay | Admitting: Adult Health

## 2019-09-24 NOTE — Telephone Encounter (Signed)
Called and [facility] for patient to return my call about AET monitoring study.  Wilber Bihari, NP

## 2019-10-14 ENCOUNTER — Encounter (INDEPENDENT_AMBULATORY_CARE_PROVIDER_SITE_OTHER): Payer: Self-pay

## 2019-10-16 ENCOUNTER — Other Ambulatory Visit (HOSPITAL_COMMUNITY): Payer: Self-pay | Admitting: Physician Assistant

## 2019-10-17 ENCOUNTER — Telehealth: Payer: Self-pay | Admitting: Cardiology

## 2019-10-17 NOTE — Telephone Encounter (Signed)
Follow up  ° ° °Pt is returning call  ° ° °Please call back  °

## 2019-10-17 NOTE — Telephone Encounter (Signed)
Patient c/o Palpitations:  High priority if patient c/o lightheadedness, shortness of breath, or chest pain  1) How long have you had palpitations/irregular HR/ Afib? Are you having the symptoms now? A week and a half  2) Are you currently experiencing lightheadedness, SOB or CP? No  3) Do you have a history of afib (atrial fibrillation) or irregular heart rhythm? No  4) Have you checked your BP or HR? (document readings if available): No  5) Are you experiencing any other symptoms? No

## 2019-10-17 NOTE — Telephone Encounter (Signed)
Lm to call back ./cy 

## 2019-10-17 NOTE — Telephone Encounter (Signed)
Per pt has had 2 episodes of  palpitations this week while exercising and had to sit down for a min to catch breath no other symptoms and pt also noted a episode last week as well the patient just wanted to bring this to Dr Marlou Porch attention Pt has started new med Anastrozole beginning of June  Per pt last episode of palpitations was 6 years ago had been doing well until last week .Will forward to Dr Marlou Porch for review and recommendations./cy

## 2019-10-17 NOTE — Telephone Encounter (Signed)
Follow up   Pt is returning call    

## 2019-10-17 NOTE — Telephone Encounter (Signed)
Pt aware and prefers to continue to monitor and will call back if needs further assistance  ./cy

## 2019-10-17 NOTE — Telephone Encounter (Signed)
Since this was an isolated event, we could continue to monitor conservatively and watch. If she would like, we could also add back low-dose Toprol-XL 25 mg at night as she was taking previously. Candee Furbish, MD

## 2019-10-21 ENCOUNTER — Encounter (INDEPENDENT_AMBULATORY_CARE_PROVIDER_SITE_OTHER): Payer: Self-pay

## 2019-10-22 ENCOUNTER — Other Ambulatory Visit: Payer: Self-pay

## 2019-10-22 ENCOUNTER — Ambulatory Visit
Admission: RE | Admit: 2019-10-22 | Discharge: 2019-10-22 | Disposition: A | Discharge status: Home / Self Care | Payer: Medicare Other | Source: Ambulatory Visit | Attending: Radiation Oncology | Admitting: Radiation Oncology

## 2019-10-22 ENCOUNTER — Encounter: Payer: Self-pay | Admitting: Radiation Oncology

## 2019-10-22 VITALS — BP 110/78 | HR 57 | Temp 98.5°F | Resp 12 | Wt 125.1 lb

## 2019-10-22 DIAGNOSIS — C50411 Malignant neoplasm of upper-outer quadrant of right female breast: Secondary | ICD-10-CM | POA: Diagnosis not present

## 2019-10-22 DIAGNOSIS — Z923 Personal history of irradiation: Secondary | ICD-10-CM | POA: Diagnosis not present

## 2019-10-22 DIAGNOSIS — Z170 Estrogen receptor positive status [ER+]: Secondary | ICD-10-CM | POA: Diagnosis not present

## 2019-10-22 DIAGNOSIS — Z79811 Long term (current) use of aromatase inhibitors: Secondary | ICD-10-CM | POA: Diagnosis not present

## 2019-10-22 NOTE — Progress Notes (Signed)
Radiation Oncology Follow up Note  Name: Carrie Serrano   Date:   10/22/2019 MRN:  [id number] DOB: 12-10-1950    This 69 y.o. 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  REFERRING PROVIDER: [name], PA  HPI: Patient is a 69 year old female now at 1 month having completed whole breast radiation to her right breast for stage I ER/PR positive invasive mammary carcinoma seen today in routine follow-up she is doing well.  She specifically denies breast tenderness cough or bone pain.  She has been started on.  Arimidex is tolerating that well without side effect.  COMPLICATIONS OF TREATMENT: none  FOLLOW UP COMPLIANCE: keeps appointments   PHYSICAL EXAM:  BP 110/78 (BP Location: Left Arm, Patient Position: Sitting, Cuff Size: Small)   Pulse (!) 57   Temp 98.5 F (36.9 C) (Tympanic)   Resp 12   Wt 125 lb 1.6 oz (56.7 kg)   BMI 20.19 kg/m  Lungs are clear to A&P cardiac examination essentially unremarkable with regular rate and rhythm. No dominant mass or nodularity is noted in either breast in 2 positions examined. Incision is well-healed. No axillary or supraclavicular adenopathy is appreciated. Cosmetic result is excellent.  She still has some hyperpigmentation around the nipple which I assured will clear over the next several months.  Well-developed well-nourished patient in NAD. HEENT reveals PERLA, EOMI, discs not visualized.  Oral cavity is clear. No oral mucosal lesions are identified. Neck is clear without evidence of cervical or supraclavicular adenopathy. Lungs are clear to A&P. Cardiac examination is essentially unremarkable with regular rate and rhythm without murmur rub or thrill. Abdomen is benign with no organomegaly or masses noted. Motor sensory and DTR levels are equal and symmetric in the upper and lower extremities. Cranial nerves  II through XII are grossly intact. Proprioception is intact. No peripheral adenopathy or edema is identified. No motor or sensory levels are noted. Crude visual fields are within normal range.  RADIOLOGY RESULTS: No current films to review  PLAN: Present time patient is doing well with no 1 month out from whole breast radiation still has some slight hyperpigmentation of the skin which I assured her will clear.  She is currently on Arimidex tolerating it well without side effect.  I have asked to see her back in 4 to 5 months for follow-up.  She continues close follow-up care in [place].  Patient knows to call with any concerns.  I would like to take this opportunity to thank you for allowing me to participate in the care of your patient.Noreene Filbert, MD

## 2019-10-27 ENCOUNTER — Ambulatory Visit: Payer: Medicare Other

## 2019-10-28 ENCOUNTER — Encounter (INDEPENDENT_AMBULATORY_CARE_PROVIDER_SITE_OTHER): Payer: Self-pay

## 2019-11-24 ENCOUNTER — Ambulatory Visit: Payer: Medicare Other

## 2019-11-25 ENCOUNTER — Encounter (INDEPENDENT_AMBULATORY_CARE_PROVIDER_SITE_OTHER): Payer: Self-pay

## 2019-12-02 ENCOUNTER — Encounter (INDEPENDENT_AMBULATORY_CARE_PROVIDER_SITE_OTHER): Payer: Self-pay

## 2019-12-16 ENCOUNTER — Encounter (INDEPENDENT_AMBULATORY_CARE_PROVIDER_SITE_OTHER): Payer: Self-pay

## 2019-12-22 ENCOUNTER — Other Ambulatory Visit: Payer: Self-pay

## 2019-12-22 ENCOUNTER — Ambulatory Visit: Payer: Medicare Other | Attending: General Surgery

## 2019-12-22 DIAGNOSIS — Z170 Estrogen receptor positive status [ER+]: Secondary | ICD-10-CM | POA: Insufficient documentation

## 2019-12-22 DIAGNOSIS — C50411 Malignant neoplasm of upper-outer quadrant of right female breast: Secondary | ICD-10-CM | POA: Insufficient documentation

## 2019-12-22 NOTE — Therapy (Signed)
[place], [place], [address] Phone: [phone]   Fax:  [phone]  Physical Therapy Treatment  Patient Details  Name: Carrie Serrano MRN: [id number] Date of Birth: 12-Aug-1950 Referring Provider (PT): Dr. Stark Klein   Encounter Date: 12/22/2019   PT End of Session - 12/22/19 1534    Visit Number 2   # unchanged due to screen only   Number of Visits 2    PT Start Time 1521    PT Stop Time 1533    PT Time Calculation (min) 12 min    Activity Tolerance Patient tolerated treatment well    Behavior During Therapy [facility] for tasks assessed/performed           Past Medical History:  Diagnosis Date  . HSV-2 (herpes simplex virus 2) infection   . Osteopenia   . PAT (paroxysmal atrial tachycardia) ([place])   . SVT (supraventricular tachycardia) ([facility])     Past Surgical History:  Procedure Laterality Date  . ABDOMINAL HYSTERECTOMY    . ABLATION OF DYSRHYTHMIC FOCUS  06/13/2012   SVT    Dr  Lovena Le  . BREAST LUMPECTOMY WITH RADIOACTIVE SEED AND SENTINEL LYMPH NODE BIOPSY Right 07/01/2019   Procedure: RIGHT BREAST LUMPECTOMY WITH RADIOACTIVE SEED AND SENTINEL LYMPH NODE BIOPSY;  Surgeon: Stark Klein, MD;  Location: [facility];  Service: General;  Laterality: Right;  . COLONOSCOPY  2012  . COLONOSCOPY WITH PROPOFOL N/A 06/26/2016   Procedure: COLONOSCOPY WITH PROPOFOL;  Surgeon: Lollie Sails, MD;  Location: [facility] ENDOSCOPY;  Service: Endoscopy;  Laterality: N/A;  . ELECTROPHYSIOLOGY STUDY N/A 06/13/2012   Procedure: ELECTROPHYSIOLOGY STUDY;  Surgeon: Evans Lance, MD;  Location: [facility] CATH LAB;  Service: Cardiovascular;  Laterality: N/A;  . FLEXIBLE SIGMOIDOSCOPY N/A 01/24/2017   Procedure: FLEXIBLE SIGMOIDOSCOPY;  Surgeon: Toledo, Benay Pike, MD;  Location: [facility] ENDOSCOPY;  Service: Gastroenterology;  Laterality: N/A;  . SUPRAVENTRICULAR TACHYCARDIA ABLATION N/A 06/13/2012   Procedure:  SUPRAVENTRICULAR TACHYCARDIA ABLATION;  Surgeon: Evans Lance, MD;  Location: [facility] CATH LAB;  Service: Cardiovascular;  Laterality: N/A;  . varicose vein injection in left lower leg      There were no vitals filed for this visit.           L-DEX FLOWSHEETS - 12/22/19 1500      L-DEX LYMPHEDEMA SCREENING   DOMINANT SIDE Right    At Risk Side Right    BASELINE SCORE (UNILATERAL) -2.5    L-DEX SCORE (UNILATERAL) 2.4    VALUE CHANGE (UNILAT) 4.9                                  PT Long Term Goals - 07/28/19 1321      PT LONG TERM GOAL #1   Title Patient will demonstrate she has regainde full shoulder ROM and function post operatively compared to baselines.    Time 8    Period Weeks    Status Achieved                 Plan - 12/22/19 1534    Clinical Impression Statement Pt returns for 3 month L-Dex screen. Her score of 2.4 gives her a change from baseline of 4.9. Pt reports breast still very tender, like when cutting grass and working out with heavy weights. So encouraged her to cut back on weights, but 3x/wk okay. Also suggested she take  break from longer, heavy activites she reports doing to allow more recovery time in her lymphatic system. She verbalizes understanding of all and agrees to these suggestions. Also instructed to call us if she continues to have problems or breast worsens. Otherwise, no further treatment indicated at this time and pt agrees to return in 3 months.    PT Next Visit Plan Cont every 3 month L-Dex screens    Consulted and Agree with Plan of Care Patient           Patient will benefit from skilled therapeutic intervention in order to improve the following deficits and impairments:     Visit Diagnosis: Malignant neoplasm of upper-outer quadrant of right breast in female, estrogen receptor positive ([place])     Problem List Patient Active Problem List   Diagnosis Date Noted  . Malignant neoplasm of upper-outer  quadrant of right breast in female, estrogen receptor positive ([place]) 06/13/2019  . Bilateral hearing loss due to cerumen impaction 11/19/2018  . Gassiness 05/04/2017  . Right wrist pain 10/05/2016  . Varicose veins of both lower extremities with complications 15/83/0940  . Chronic venous insufficiency 06/30/2016  . Contracture of palmar fascia 06/15/2015  . PSVT (paroxysmal supraventricular tachycardia) ([place]) 09/18/2014  . Right bundle branch block 09/18/2014  . RBBB 09/05/2013  . LAFB (left anterior fascicular block) 09/05/2013  . SVT (supraventricular tachycardia) ([place]) 12/08/2011  . Tobacco abuse 12/08/2011    Otelia Limes, PTA 12/22/2019, 3:38 PM  [address], [place], [address] Phone: [phone]   Fax:  [phone]  Name: Carrie Serrano MRN: [id number] Date of Birth: 1951/01/07

## 2019-12-23 ENCOUNTER — Encounter (INDEPENDENT_AMBULATORY_CARE_PROVIDER_SITE_OTHER): Payer: Self-pay

## 2019-12-25 ENCOUNTER — Encounter: Payer: Self-pay | Admitting: Adult Health

## 2019-12-25 ENCOUNTER — Other Ambulatory Visit: Payer: Self-pay

## 2019-12-25 ENCOUNTER — Inpatient Hospital Stay: Payer: Medicare Other | Attending: Hematology and Oncology | Admitting: Adult Health

## 2019-12-25 VITALS — BP 116/74 | HR 56 | Temp 97.6°F | Resp 18 | Ht 66.0 in | Wt 123.8 lb

## 2019-12-25 DIAGNOSIS — M8580 Other specified disorders of bone density and structure, unspecified site: Secondary | ICD-10-CM | POA: Diagnosis not present

## 2019-12-25 DIAGNOSIS — Z79811 Long term (current) use of aromatase inhibitors: Secondary | ICD-10-CM | POA: Insufficient documentation

## 2019-12-25 DIAGNOSIS — C50411 Malignant neoplasm of upper-outer quadrant of right female breast: Secondary | ICD-10-CM | POA: Insufficient documentation

## 2019-12-25 DIAGNOSIS — Z170 Estrogen receptor positive status [ER+]: Secondary | ICD-10-CM | POA: Diagnosis not present

## 2019-12-25 NOTE — Progress Notes (Signed)
SURVIVORSHIP  VISIT:    BRIEF ONCOLOGIC HISTORY:  Oncology History  Malignant neoplasm of upper-outer quadrant of right breast in female, estrogen receptor positive ([place])  06/13/2019 Initial Diagnosis   Screening mammogram detected a right breast mass, palpable on exam. Diagnostic mammogram showed a 1.0cm mass in Carrie Serrano right breast at Carrie Serrano 10:30 position. Biopsy showed invasive mammary carcinoma, grade 1, HER-2 equivocal by IHC, negative by FISH, ER+ 100%, PR+ 90%, Ki67 15%   06/18/2019 Cancer Staging   Staging form: Breast, AJCC 8th Edition - Clinical stage from 06/18/2019: cT1b, cN0, cM0, G2, ER+, PR+, HER2: Unknown   07/01/2019 Surgery   Right lumpectomy Barry Dienes) ([id number]): IDC, 1.3cm, grade 2, clear margins, 2 right axillary lymph nodes negative.   07/01/2019 Cancer Staging   Staging form: Breast, AJCC 8th Edition - Pathologic stage from 07/01/2019: Stage IA (pT1c, pN0, cM0, G2, ER+, PR+, HER2-)   07/16/2019 Oncotype testing   Carrie Serrano Oncotype DX score was 6 predicting a risk of outside Carrie Serrano breast recurrence over Carrie Serrano next 9 years of 3% if Carrie Serrano Serrano's only systemic therapy is Anastrozole for 5 years.    08/13/2019 - 09/15/2019 Radiation Therapy   Carrie Serrano Serrano initially received a dose of 42.56 Gy in 16 fractions to Carrie Serrano breast using whole-breast tangent fields. This was delivered using a 3-D conformal technique. Carrie Serrano pt received a boost delivering an additional 16 Gy in 8 fractions using a electron boost with 30mV electrons. Carrie Serrano total dose was 58.56 Gy.   09/2019 - 09/2024 Anti-estrogen oral therapy   Anastrozole     INTERVAL HISTORY:  Carrie Serrano Serrano review her survivorship care plan detailing her treatment course for breast cancer, as well as monitoring long-term side effects of that treatment, education regarding health maintenance, screening, and overall wellness and health promotion.     Overall, Ms. BFlennikenreports feeling quite well.  She is taking anastrozole daily.She tolerates this  quite well.  She completed her survivorship survey and notes some mild swelling in her left breast and underarm.  She saw PT for this yesterday and they recommended she decrease Carrie Serrano amount of weights she is lifting.  She was not recommended for physical therapy at this point.  She notes that she underwent bone density testing on 08/2019 and it showed osteopenia with a t score of -1.7.  She receives Prolia for this.   REVIEW OF SYSTEMS:  Review of Systems  Constitutional: Negative for appetite change, chills, fatigue, fever and unexpected weight change.  HENT:   Negative for hearing loss and lump/mass.   Eyes: Negative for eye problems and icterus.  Respiratory: Negative for chest tightness, cough, hemoptysis, shortness of breath and wheezing.   Cardiovascular: Negative for chest pain, leg swelling and palpitations.  Gastrointestinal: Negative for abdominal distention, abdominal pain, constipation, diarrhea, nausea and vomiting.  Endocrine: Negative for hot flashes.  Musculoskeletal: Positive for arthralgias.  Skin: Negative for itching and rash.  Neurological: Negative for dizziness, extremity weakness, headaches and numbness.  Hematological: Negative for adenopathy. Does not bruise/bleed easily.  Psychiatric/Behavioral: Negative for depression. Carrie Serrano Serrano is not nervous/anxious.   Breast: Denies any new nodularity, masses, tenderness, nipple changes, or nipple discharge.      ONCOLOGY TREATMENT TEAM:  1. Sur[facility]  [facility] 2. Medical Oncologist: Dr. GLindi Adie 3. Radiation Oncologist: Dr. kSondra Come   PAST MEDICAL/SURGICAL HISTORY:  Past Medical History:  Diagnosis Date  . HSV-2 (herpes simplex virus 2) infection   . Osteopenia   .  PAT (paroxysmal atrial tachycardia) ([place])   . SVT (supraventricular tachycardia) ([facility])    Past Surgical History:  Procedure Laterality Date  . ABDOMINAL HYSTERECTOMY    . ABLATION OF DYSRHYTHMIC FOCUS  06/13/2012   SVT    Dr   Lovena Le  . BREAST LUMPECTOMY WITH RADIOACTIVE SEED AND SENTINEL LYMPH NODE BIOPSY Right 07/01/2019   Procedure: RIGHT BREAST LUMPECTOMY WITH RADIOACTIVE SEED AND SENTINEL LYMPH NODE BIOPSY;  Surgeon: Stark Klein, MD;  Location: [place];  Service: General;  Laterality: Right;  . COLONOSCOPY  2012  . COLONOSCOPY WITH PROPOFOL N/A 06/26/2016   Procedure: COLONOSCOPY WITH PROPOFOL;  Surgeon: Lollie Sails, MD;  Location[facility] ENDOSCOPY;  Service: Endoscopy;  Laterality: N/A;  . ELECTROPHYSIOLOGY STUDY N/A 06/13/2012   Procedure: ELECTROPHYSIOLOGY STUDY;  Surgeon: Evans Lance, MD;  Location: [facility] CATH LAB;  Service: Cardiovascular;  Laterality: N/A;  . FLEXIBLE SIGMOIDOSCOPY N/A 01/24/2017   Procedure: FLEXIBLE SIGMOIDOSCOPY;  Surgeon: Toledo, Benay Pike, MD;  Location: [facility] ENDOSCOPY;  Service: Gastroenterology;  Laterality: N/A;  . SUPRAVENTRICULAR TACHYCARDIA ABLATION N/A 06/13/2012   Procedure: SUPRAVENTRICULAR TACHYCARDIA ABLATION;  Surgeon: Evans Lance, MD;  Location: [facility] CATH LAB;  Service: Cardiovascular;  Laterality: N/A;  . varicose vein injection in left lower leg       ALLERGIES:  Allergies  Allergen Reactions  . Latex Itching     CURRENT MEDICATIONS:  Outpatient Encounter Medications as of 12/25/2019  Medication Sig  . anastrozole (ARIMIDEX) 1 MG tablet Take 1 tablet (1 mg total) by mouth daily.  . Calcium Carbonate-Vit D-Min (CALCIUM 1200) 1200-1000 MG-UNIT CHEW Chew 1 tablet by mouth daily.  Marland Kitchen denosumab (PROLIA) 60 MG/ML SOLN injection Inject 60 mg into Carrie Serrano skin every 6 (six) months. Administer in upper arm, thigh, or abdomen  . valACYclovir (VALTREX) 500 MG tablet Take 500 mg by mouth 2 (two) times daily as needed. For outbreak  . vitamin B-12 (CYANOCOBALAMIN) 1000 MCG tablet Take 1,000 mcg by mouth daily.  . Vitamin D, Cholecalciferol, 400 units CAPS Take 800 Units by mouth.   No facility-administered encounter medications on file as of 12/25/2019.      ONCOLOGIC FAMILY HISTORY:  Family History  Problem Relation Age of Onset  . Stroke Mother   . Diabetes Mother   . Heart attack Father   . Hypertension Father      GENETIC COUNSELING/TESTING: Not at this time  SOCIAL HISTORY:  Social History   Socioeconomic History  . Marital status: Single    Spouse name: Not on file  . Number of children: Not on file  . Years of education: Not on file  . Highest education level: Not on file  Occupational History  . Not on file  Tobacco Use  . Smoking status: Former Smoker    Packs/day: 0.50    Years: 25.00    Pack years: 12.50    Types: Cigarettes    Quit date: 05/01/2014    Years since quitting: 5.6  . Smokeless tobacco: Never Used  . Tobacco comment: 5 cigarettes a day.  Vaping Use  . Vaping Use: Never used  Substance and Sexual Activity  . Alcohol use: Yes    Alcohol/week: 8.0 - 10.0 standard drinks    Types: 1 Cans of beer, 7 - 9 Standard drinks or equivalent per week    Comment: one beer a day  . Drug use: No  . Sexual activity: Not on file  Other Topics Concern  . Not on file  Social History Narrative  . Not on file   Social Determinants of Health   Financial Resource Strain:   . Difficulty of Paying Living Expenses: Not on file  Food Insecurity:   . Worried About Charity fundraiser in Carrie Serrano Last Year: Not on file  . Ran Out of Food in Carrie Serrano Last Year: Not on file  Transportation Needs:   . Lack of Transportation (Medical): Not on file  . Lack of Transportation (Non-Medical): Not on file  Physical Activity:   . Days of Exercise per Week: Not on file  . Minutes of Exercise per Session: Not on file  Stress:   . Feeling of Stress : Not on file  Social Connections:   . Frequency of Communication with Friends and Family: Not on file  . Frequency of Social Gatherings with Friends and Family: Not on file  . Attends Religious Services: Not on file  . Active Member of Clubs or Organizations: Not on file  . Attends  Archivist Meetings: Not on file  . Marital Status: Not on file  Intimate Partner Violence:   . Fear of Current or Ex-Partner: Not on file  . Emotionally Abused: Not on file  . Physically Abused: Not on file  . Sexually Abused: Not on file     OBSERVATIONS/OBJECTIVE:  BP 116/74 (BP Location: Left Arm, Serrano Position: Sitting)   Pulse (!) 56   Temp 97.6 F (36.4 C) (Tympanic)   Resp 18   Ht '5\' 6"'  (1.676 m)   Wt 123 lb 12.8 oz (56.2 kg)   SpO2 100%   BMI 19.98 kg/m  GENERAL: Serrano is a well appearing female in no acute distress HEENT:  Sclerae anicteric. Mask in place. Neck is supple.  NODES:  No cervical, supraclavicular, or axillary lymphadenopathy palpated.  BREAST EXAM: right breast s/p lumpectomy and radiation, mild swelling noted, no sign of breast cancer recurrence, left breast is benign LUNGS:  Clear to auscultation bilaterally.  No wheezes or rhonchi. HEART:  Regular rate and rhythm. No murmur appreciated. ABDOMEN:  Soft, nontender.  Positive, normoactive bowel sounds. No organomegaly palpated. MSK:  No focal spinal tenderness to palpation. Full range of motion bilaterally in Carrie Serrano upper extremities. EXTREMITIES:  No peripheral edema.   SKIN:  Clear with no obvious rashes or skin changes. No nail dyscrasia. NEURO:  Nonfocal. Well oriented.  Appropriate affect.   LABORATORY DATA:  None for this visit.  DIAGNOSTIC IMAGING:  None for this visit.      ASSESSMENT AND PLAN:  Ms.. Serrano is a pleasant 69 y.o. female with Stage IA right breast invasive ductal carcinoma, ER+/PR+/HER2-, diagnosed in 06/2019, treated with lumpectomy, adjuvant radiation therapy, and anti-estrogen therapy with Anastrozole beginning in 09/2019.  [facility] for our initial meeting and routine follow-up post-completion of treatment for breast cancer.    1. Stage IA right breast cancer:  Carrie Serrano Serrano is continuing to recover from definitive treatment for breast  cancer. She will follow-up with her medical oncologist, Dr. Lindi Adie in 6 months with history and physical exam per surveillance protocol.  She will continue her anti-estrogen therapy with Anastrozole. Thus far, she is tolerating Carrie Serrano Anastrozole well, with minimal side effects. She was instructed to make Dr. Lindi Adie or myself aware if she begins to experience any worsening side effects of Carrie Serrano medication and I could see her back in clinic to help manage those side effects, as needed. Her mammogram is due 05/2020; orders placed today.  Today, a comprehensive survivorship care plan and treatment summary was reviewed with Carrie Serrano Serrano today detailing her breast cancer diagnosis, treatment course, potential late/long-term effects of treatment, appropriate follow-up care with recommendations for Carrie Serrano future, and Serrano education resources.  A copy of this summary, along with a letter will be sent to Carrie Serrano Serrano's primary care provider via mail/fax/In Basket message after today's visit.    2. Bone health:  Given Carrie Serrano Serrano's age/history of breast cancer and her current treatment regimen including anti-estrogen therapy with Anastrozole, she is at risk for bone demineralization.  Her last DEXA scan was 08/2019 that showed osteopenia.  She is already receiving prolia every 6 months, and continues on this.  She was given education on specific activities to promote bone health.  4. Cancer screening:  Due to Carrie Serrano Serrano history and her age, she should receive screening for skin cancers, colon cancer, and gynecologic cancers.  Carrie Serrano information and recommendations are listed on Carrie Serrano Serrano's comprehensive care plan/treatment summary and were reviewed in detail with Carrie Serrano Serrano.    5. Health maintenance and wellness promotion: Carrie Serrano Serrano was encouraged to consume 5-7 servings of fruits and vegetables per day. We reviewed Carrie Serrano "Nutrition Rainbow" handout, as well as Carrie Serrano handout "Take Control of Your Health and Reduce Your Cancer Risk"  from Carrie Serrano [employer].  She was also encouraged to engage in moderate to vigorous exercise for 30 minutes per day most days of Carrie Serrano week. We discussed Carrie Serrano [employer] fitness program, which is designed for cancer survivors to help them become more physically fit after cancer treatments.  She was instructed to limit her alcohol consumption and continue to abstain from tobacco use.     6. Support services/counseling: It is not uncommon for this period of Carrie Serrano Serrano's cancer care trajectory to be one of many emotions and stressors.  We discussed how this can be increasingly difficult during Carrie Serrano times of quarantine and social distancing due to Carrie Serrano COVID-19 pandemic.   She was given information regarding our available services and encouraged to contact me with any questions or for help enrolling in any of our support group/programs.    Follow up instructions:    -Return to cancer center 06/2020 for f/u with Dr. Jana Hakim  -Mammogram due in 05/2020 -Follow up with surgery 12/2019 -She is welcome to return back to [facility] at any time; no additional follow-up needed at this time.  -Consider referral back to survivorship as a long-term survivor for continued surveillance  Carrie Serrano Serrano was provided an opportunity to ask questions and all were answered. Carrie Serrano Serrano agreed with Carrie Serrano plan and demonstrated an understanding of Carrie Serrano instructions.   Total encounter time: 45 minutes*  Wilber Bihari, NP 12/25/19 11:07 AM Medical Oncology and Hematology [facility] [place], [place] [address] Tel. [phone]    Fax. [phone]  *Total Encounter Time as defined by Carrie Serr[place] Centers for Medicare and Medicaid Services includes, in addition to Carrie Serrano face-to-face time of a Serrano visit (documented in Carrie Serrano note above) non-face-to-face time: obtaining and reviewing outside history, ordering and reviewing medications, tests or procedures, care coordination (communications  with other health care professionals or caregivers) and documentation in Carrie Serrano medical record.

## 2019-12-30 ENCOUNTER — Encounter (INDEPENDENT_AMBULATORY_CARE_PROVIDER_SITE_OTHER): Payer: Self-pay

## 2020-01-05 ENCOUNTER — Encounter: Payer: Self-pay | Admitting: Adult Health

## 2020-01-06 ENCOUNTER — Encounter (INDEPENDENT_AMBULATORY_CARE_PROVIDER_SITE_OTHER): Payer: Self-pay

## 2020-01-06 ENCOUNTER — Encounter: Payer: Self-pay | Admitting: Adult Health

## 2020-01-20 ENCOUNTER — Encounter: Payer: Self-pay | Admitting: *Deleted

## 2020-01-20 ENCOUNTER — Encounter (INDEPENDENT_AMBULATORY_CARE_PROVIDER_SITE_OTHER): Payer: Self-pay

## 2020-01-27 ENCOUNTER — Encounter (INDEPENDENT_AMBULATORY_CARE_PROVIDER_SITE_OTHER): Payer: Self-pay

## 2020-02-02 ENCOUNTER — Other Ambulatory Visit (HOSPITAL_COMMUNITY): Payer: Self-pay | Admitting: Physician Assistant

## 2020-02-03 ENCOUNTER — Encounter (INDEPENDENT_AMBULATORY_CARE_PROVIDER_SITE_OTHER): Payer: Self-pay

## 2020-02-10 ENCOUNTER — Encounter (INDEPENDENT_AMBULATORY_CARE_PROVIDER_SITE_OTHER): Payer: Self-pay

## 2020-02-17 ENCOUNTER — Encounter (HOSPITAL_COMMUNITY): Payer: Self-pay

## 2020-02-18 ENCOUNTER — Other Ambulatory Visit: Payer: Self-pay | Admitting: General Surgery

## 2020-02-18 DIAGNOSIS — Z800 Family history of malignant neoplasm of digestive organs: Secondary | ICD-10-CM

## 2020-02-27 ENCOUNTER — Ambulatory Visit: Payer: Medicare Other | Attending: General Surgery | Admitting: Occupational Therapy

## 2020-02-27 ENCOUNTER — Other Ambulatory Visit: Payer: Self-pay

## 2020-02-27 DIAGNOSIS — Z483 Aftercare following surgery for neoplasm: Secondary | ICD-10-CM

## 2020-02-27 DIAGNOSIS — I890 Lymphedema, not elsewhere classified: Secondary | ICD-10-CM | POA: Insufficient documentation

## 2020-02-27 NOTE — Therapy (Signed)
[facility] [address] [place], [address] Phone: [phone]   Fax:  [phone]  Occupational Therapy Treatment  Patient Details  Name: [name] MRN: [id number] Date of Birth: 03/05/1951 No data recorded  Encounter Date: 02/27/2020   OT End of Session - 02/27/20 2219    Visit Number 1    Number of Visits 4    Date for OT Re-Evaluation 04/09/20    OT Start Time 1155    OT Stop Time 1242    OT Time Calculation (min) 47 min    Activity Tolerance Patient tolerated treatment well    Behavior During Therapy [facility] for tasks assessed/performed           Past Medical History:  Diagnosis Date   HSV-2 (herpes simplex virus 2) infection    Osteopenia    PAT (paroxysmal atrial tachycardia) ([facility])    SVT (supraventricular tachycardia) ([place])     Past Surgical History:  Procedure Laterality Date   ABDOMINAL HYSTERECTOMY     ABLATION OF DYSRHYTHMIC FOCUS  06/13/2012   SVT    Dr  Lovena Le   BREAST LUMPECTOMY WITH RADIOACTIVE SEED AND SENTINEL LYMPH NODE BIOPSY Right 07/01/2019   Procedure: RIGHT BREAST LUMPECTOMY WITH RADIOACTIVE SEED AND SENTINEL LYMPH NODE BIOPSY;  Surgeon: Stark Klein, MD;  Location: [place];  Service: General;  Laterality: Right;   COLONOSCOPY  2012   COLONOSCOPY WITH PROPOFOL N/A 06/26/2016   Procedure: COLONOSCOPY WITH PROPOFOL;  Surgeon: Lollie Sails, MD;  [facility] ENDOSCOPY;  Service: Endoscopy;  Laterality: N/A;   ELECTROPHYSIOLOGY STUDY N/A 06/13/2012   Procedure: ELECTROPHYSIOLOGY STUDY;  Surgeon: Evans Lance, MD;  [facility] CATH LAB;  Service: Cardiovascular;  Laterality: N/A;   FLEXIBLE SIGMOIDOSCOPY N/A 01/24/2017   Procedure: FLEXIBLE SIGMOIDOSCOPY;  Surgeon: Toledo, Benay Pike, MD;  Location: [facility] ENDOSCOPY;  Service: Gastroenterology;  Laterality: N/A;   SUPRAVENTRICULAR TACHYCARDIA ABLATION N/A 06/13/2012   Procedure:  SUPRAVENTRICULAR TACHYCARDIA ABLATION;  Surgeon: Evans Lance, MD;  Location: [facility] CATH LAB;  Service: Cardiovascular;  Laterality: N/A;   varicose vein injection in left lower leg      There were no vitals filed for this visit.   Subjective Assessment - 02/27/20 2211    Subjective  I seen the surgeon and she refer me to you - thought have lymphedema under my arm/next to R breast - probable since radiation my R breast larger and harder feeling/fuller feeling    Pertinent History Carrie Serrano is a pleasant 69 y.o. female with Stage IA right breast invasive ductal carcinoma, ER+/PR+/HER2-, diagnosed in 06/2019, treated with lumpectomy, adjuvant radiation therapy ended 09/15/2019, and anti-estrogen therapy with Anastrozole beginning in 09/2019    Patient Stated Goals I want to prevent any lymphedema to develop further - what I can do to get it better    Currently in Pain? No/denies              [facility] OT Assessment - 02/27/20 0001      Assessment   Medical Diagnosis s/p right lumpectomy and SLNB    Onset Date/Surgical Date 07/01/19    Hand Dominance Right    Prior Therapy --   PT in [facility] clinic      Precautions   Precaution Comments --   R upper quadrant lymphedema     Prior Function   Level of Independence Independent    Vocation Retired    Leisure bootcamp 3  x wk, walk and run dailiy , [employer], yard work           LYMPHEDEMA/ONCOLOGY QUESTIONNAIRE - 02/27/20 0001      Right Upper Extremity Lymphedema   15 cm Proximal to Olecranon Process 27 cm    10 cm Proximal to Olecranon Process 25.4 cm    Olecranon Process 24.3 cm    15 cm Proximal to Ulnar Styloid Process 23.8 cm    10 cm Proximal to Ulnar Styloid Process 20.5 cm    Just Proximal to Ulnar Styloid Process 16 cm    Across Hand at [employer] 19.5 cm    At [place] of 2nd Digit 6 cm    At [facility] of Thumb 6.2 cm      Left Upper Extremity Lymphedema   15 cm Proximal to Olecranon Process 27 cm    10 cm  Proximal to Olecranon Process 25.5 cm    Olecranon Process 24 cm    15 cm Proximal to Ulnar Styloid Process 23 cm    10 cm Proximal to Ulnar Styloid Process 19.2 cm    Just Proximal to Ulnar Styloid Process 16 cm    Across Hand at [employer] 19.3 cm    At [place] of 2nd Digit 6.2 cm    At [facility] of Thumb 6 cm         Pt measurements WNL - compare to L - and to initiate evaluation taken prior to surgery Pt ed on self MLD and fibrotic techniques on medial R breast     Do manual lymph drainage once each day to help decrease swelling.  This should take you about 30 minutes depending on the size of your limb.  For Right Arm:   Hug yourself at the base of your neck and do 8 small circles, and 2 fingers behind clavicle 8 x   Do 8 semicircles at left armpit and right groin  Pump across chest from right to left 8 times  Pump down the right side of trunk from armpit to groin 8 times  Do 8 semicircles at left armpit and right groin 8 times  Repeat nr.1  SLOW and LIGHT with only your palm NOT FINGERTIPS If help at home can replace or do also massage over upper back from right to left , when doing chest from right to left.   Pt to pick up unilateral post mastectomy jovipak breast pad to use as needed- if feels congested under arm  Contact me after fitted with jovipak and using it for about week to 10 days                  OT Education - 02/27/20 2219    Education Details findings of eval and HEP    Person(s) Educated Patient    Methods Explanation;Demonstration;Tactile cues;Verbal cues;Handout    Comprehension Returned demonstration;Verbalized understanding;Verbal cues required               OT Long Term Goals - 02/27/20 2226      OT LONG TERM GOAL #1   Title Pt to be independent in MLD , manual therapy and  scar massage decrease fibrosis in R breast and lymphedema under arm    Baseline R medial breast firm and fibrotic , harder , nipple higher - pt feels  pocket of lymphedema under arm - later R breast at times    Time 4    Period Weeks    Status  New    Target Date 03/26/20      OT LONG TERM GOAL #2   Title Pt fitted correctly and pt wearing jovipak breast pad to decrease lymphedema symptoms in R breast and thoracic    Baseline only sports bra's - you tube MLD tech    Time 6    Period Weeks    Status New    Target Date 04/09/20                 Plan - 02/27/20 2220    Clinical Impression Statement Pt present with diagnosis of stage 1 lymphedema in R upper thoracic under arm and R medial breast - pt report changes in breast since radiation - show some fibrosis and breast larger/firmer medial breast, and per pt under her arm when swiging her arm - feels lymphedema at times - did you tube some MLD -pt can benefit from homeprogram to decrease lymphedema and fibrosis in breast - pt very active working out with weight, run, walk and yard work/DIY in rental property - pt is R hand dominant    OT Occupational Profile and History Problem Focused Assessment - Including review of records relating to presenting problem    Occupational performance deficits (Please refer to evaluation for details): ADL's;IADL's;Play;Leisure    Body Structure / Function / Physical Skills UE functional use;Scar mobility;Edema;Decreased knowledge of precautions    Rehab Potential Good    Clinical Decision Making Limited treatment options, no task modification necessary    Comorbidities Affecting Occupational Performance: None    Modification or Assistance to Complete Evaluation  No modification of tasks or assist necessary to complete eval    OT Frequency --   1x wk to biweekly   OT Duration --    OT Treatment/Interventions Self-care/ADL training;DME and/or AE instruction;Manual lymph drainage;Patient/family education;Scar mobilization;Manual Therapy    Consulted and Agree with Plan of Care Patient           Patient will benefit from skilled therapeutic  intervention in order to improve the following deficits and impairments:   Body Structure / Function / Physical Skills: UE functional use, Scar mobility, Edema, Decreased knowledge of precautions       Visit Diagnosis: Aftercare following surgery for neoplasm - Plan: Ot plan of care cert/re-cert  Lymphedema, not elsewhere classified - Plan: Ot plan of care cert/re-cert    Problem List Patient Active Problem List   Diagnosis Date Noted   Malignant neoplasm of upper-outer quadrant of right breast in female, estrogen receptor positive ([place]) 06/13/2019   Bilateral hearing loss due to cerumen impaction 11/19/2018   Gassiness 05/04/2017   Right wrist pain 10/05/2016   Varicose veins of both lower extremities with complications 73/41/9379   Chronic venous insufficiency 06/30/2016   Contracture of palmar fascia 06/15/2015   PSVT (paroxysmal supraventricular tachycardia) ([place]) 09/18/2014   Right bundle branch block 09/18/2014   RBBB 09/05/2013   LAFB (left anterior fascicular block) 09/05/2013   SVT (supraventricular tachycardia) ([place]) 12/08/2011   Tobacco abuse 12/08/2011    Rosalyn Gess OTR/L,CLT 02/27/2020, 10:33 PM  [facility] [address] [place], [address] Phone: [phone]   Fax:  [phone]  Name: TOPANGA ALVELO MRN: [id number] Date of Birth: June 16, 1950

## 2020-03-02 ENCOUNTER — Encounter (INDEPENDENT_AMBULATORY_CARE_PROVIDER_SITE_OTHER): Payer: Self-pay

## 2020-03-09 ENCOUNTER — Other Ambulatory Visit: Payer: Self-pay

## 2020-03-09 ENCOUNTER — Ambulatory Visit
Admission: RE | Admit: 2020-03-09 | Discharge: 2020-03-09 | Disposition: A | Discharge status: Home / Self Care | Payer: Medicare Other | Source: Ambulatory Visit | Attending: General Surgery | Admitting: General Surgery

## 2020-03-09 DIAGNOSIS — Z800 Family history of malignant neoplasm of digestive organs: Secondary | ICD-10-CM

## 2020-03-09 IMAGING — MR MR ABDOMEN WO/W CM MRCP
12 of 20 series · 25 of 48 positions shown · IV contrast (multihance)
Comparison: None.

CLINICAL DATA: Recently diagnosed breast carcinoma. Strong family
history of pancreatic carcinoma.

EXAM:
MRI ABDOMEN WITHOUT AND WITH CONTRAST (INCLUDING MRCP)
TECHNIQUE: Multiplanar multisequence MR imaging of the abdomen was performed
both before and after the administration of intravenous contrast.
Heavily T2-weighted images of the biliary and pancreatic ducts were
obtained, and three-dimensional MRCP images were rendered by post
processing.
CONTRAST:  11mL MULTIHANCE GADOBENATE DIMEGLUMINE 529 MG/ML IV SOLN

[Series 3: cor haste · coronal · 5.0mm · 0.74mm/px · 2 of 29 slices shown]
[im 1/29]
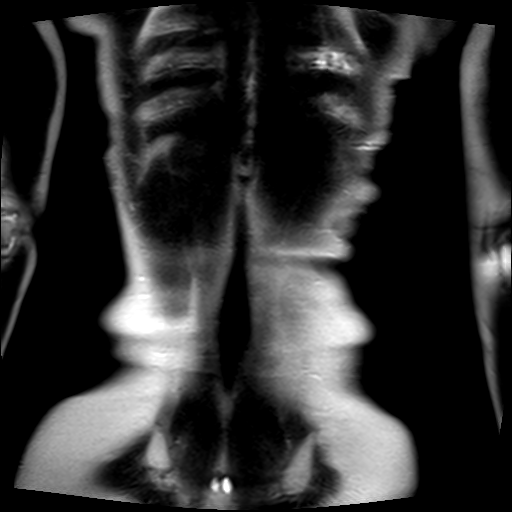
[im 29/29]
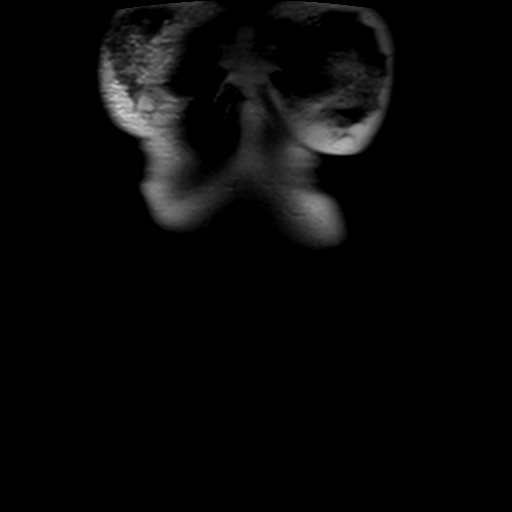

[Series 4: bSSFP · coronal · 5.0mm · 0.78mm/px · 2 of 27 slices shown (1 of 2)]
[im 1/27]
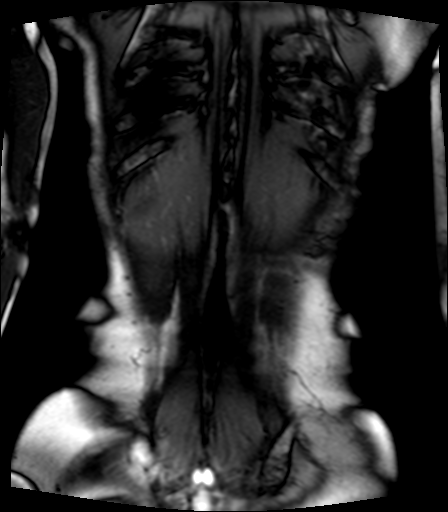
[im 27/27]
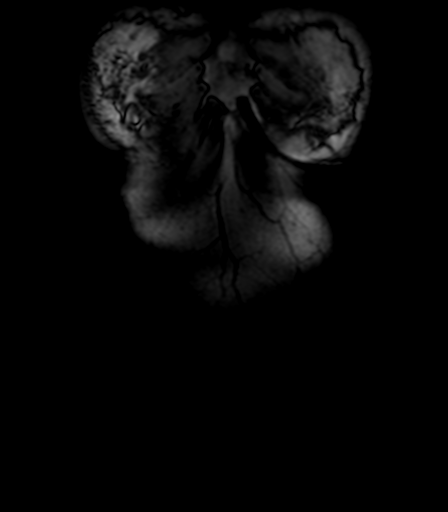

[Series 5: T2 · coronal · 3.0mm · 0.70mm/px · 2 of 48 slices shown (1 of 2)]
[im 1/48]
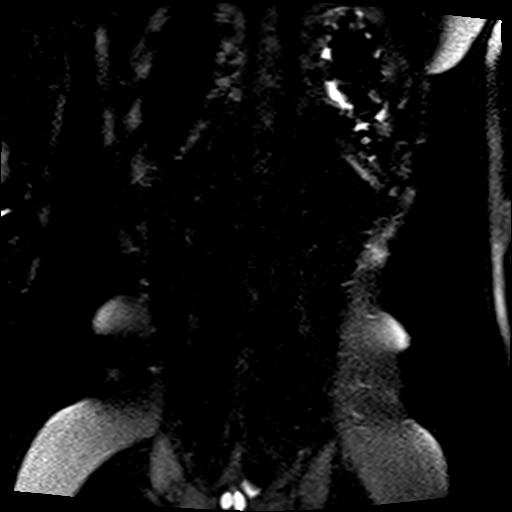
[im 48/48]
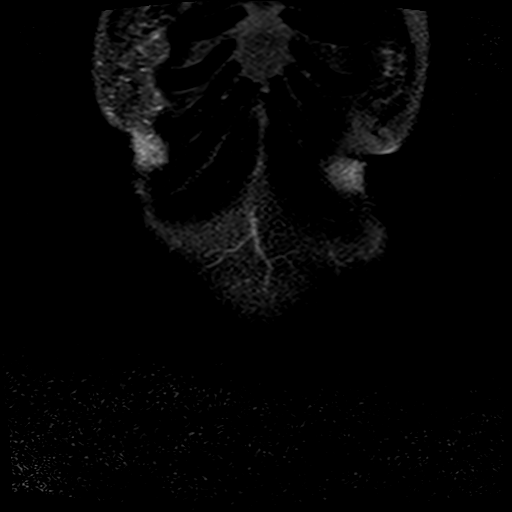

[Series 6: T1 · axial · 6.0mm · 0.74mm/px · z∈[-181,+30]mm · 2 of 66 slices shown]
[im 1/66]
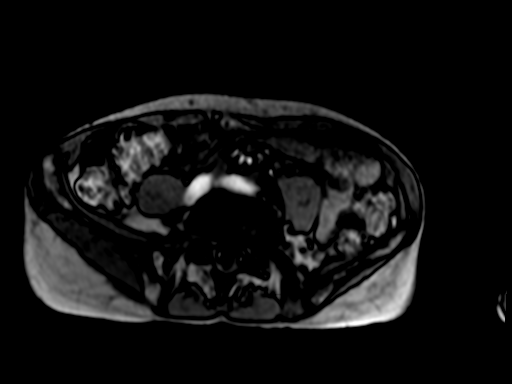
[im 66/66]
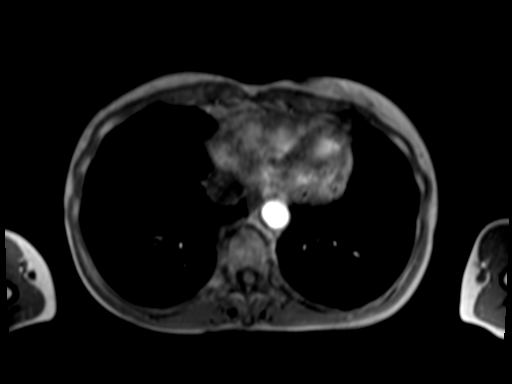

[Series 7: axial haste · axial · 6.0mm · 0.74mm/px · 1 of 33 slices shown]
[im 1/33]
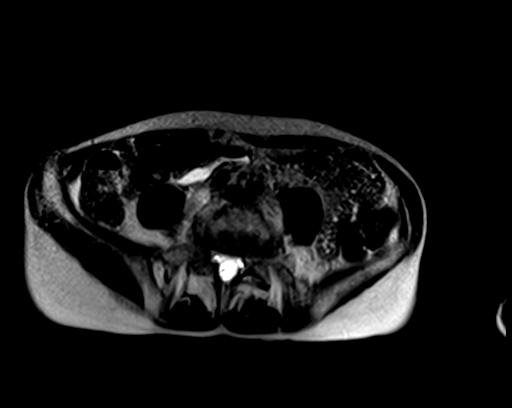

[Series 10: T2 · axial · 6.0mm · 1.12mm/px · 1 of 32 slices shown (2 of 2)]
[im 1/32]
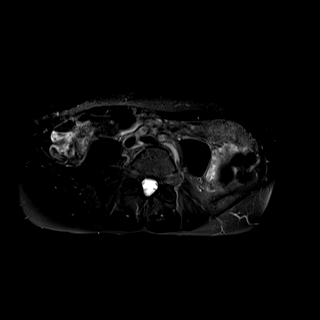

[Series 14: ep2d_diff_b50_500_800_p2_trig · axial · 6.0mm · 1.82mm/px · z∈[-174,+49]mm · 3 of 96 slices shown]
[im 1/96]
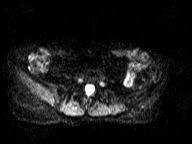
[im 48/96]
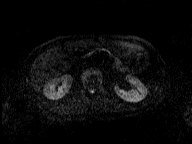
[im 96/96]
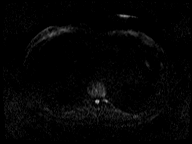

[Series 16: ep2d_diff_b50_500_800_p2_trig_adc · axial · 6.0mm · 1.82mm/px · 1 of 32 slices shown]
[im 1/32]
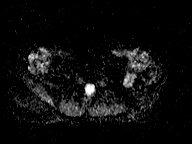

[Series 17: bSSFP · axial · 4.0mm · 0.74mm/px · z∈[-170,+18]mm · 2 of 48 slices shown (2 of 2)]
[im 1/48]
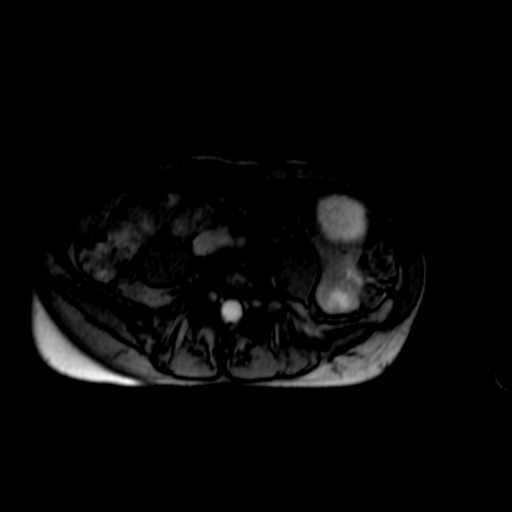
[im 48/48]
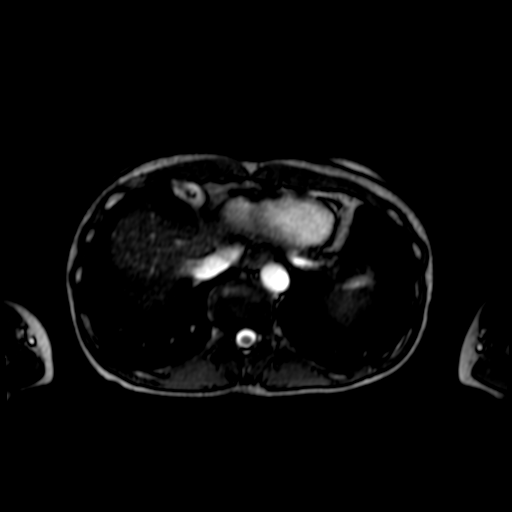

[Series 18: T1 dynamic · axial · non-contrast · 2.5mm · 0.74mm/px · z∈[-184,+33]mm · 3 of 88 slices shown]
[im 1/88]
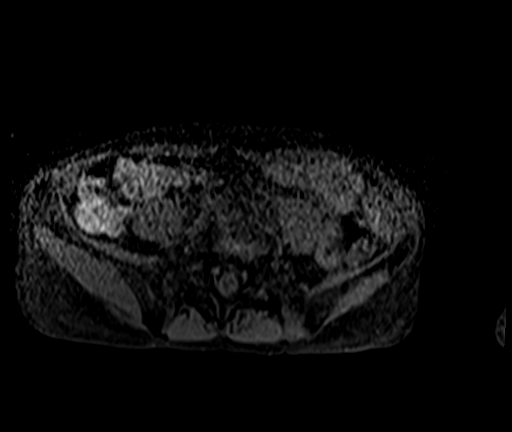
[im 44/88]
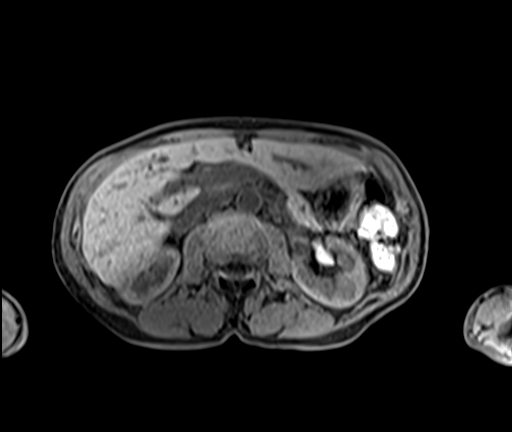
[im 88/88]
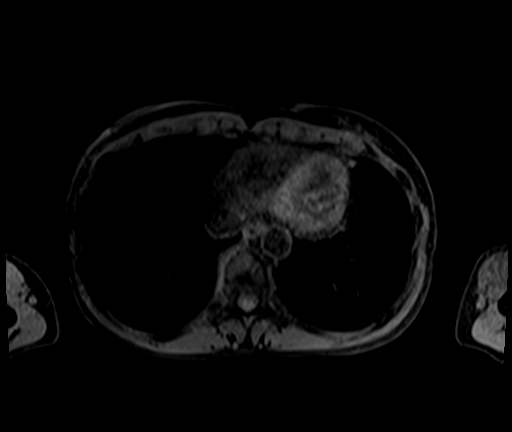

[Series 19: T1 dynamic post-contrast · axial · 2.5mm · 0.74mm/px · z∈[-184,+33]mm · 3 of 88 slices shown (1 of 2)]
[im 1/88]
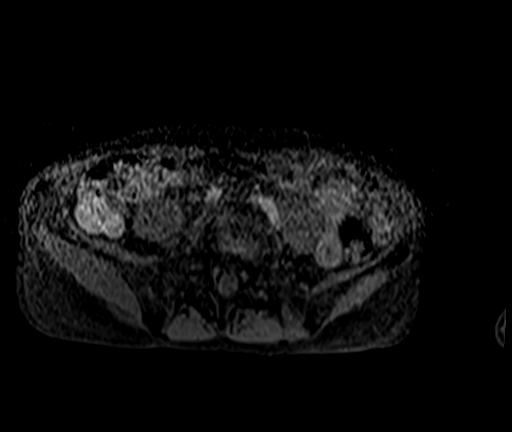
[im 44/88]
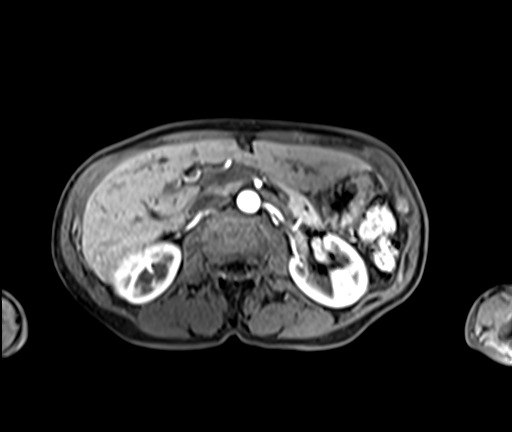
[im 88/88]
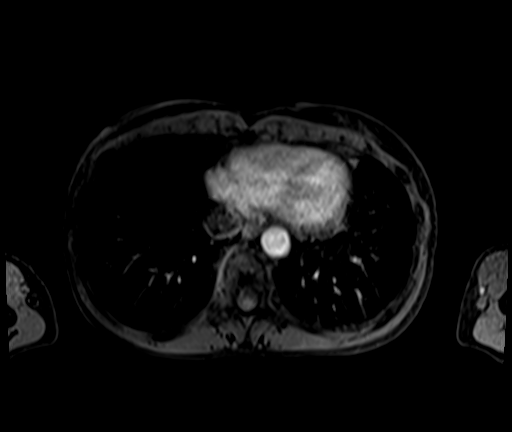

[Series 20: T1 dynamic post-contrast · axial · 2.5mm · 0.74mm/px · z∈[-184,+33]mm · 3 of 88 slices shown (2 of 2)]
[im 1/88]
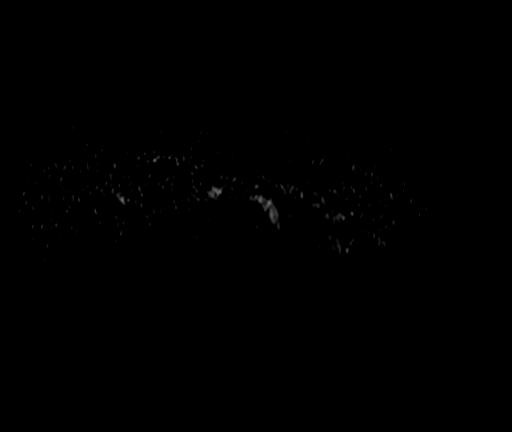
[im 44/88]
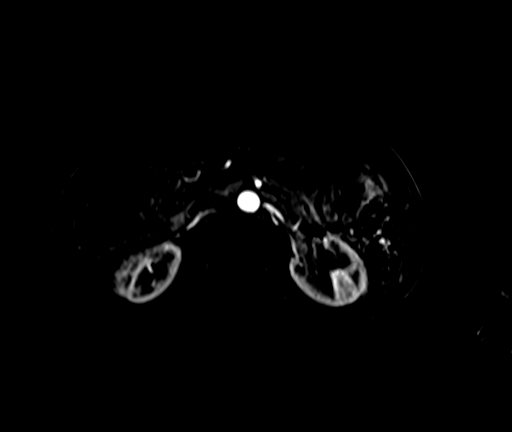
[im 88/88]
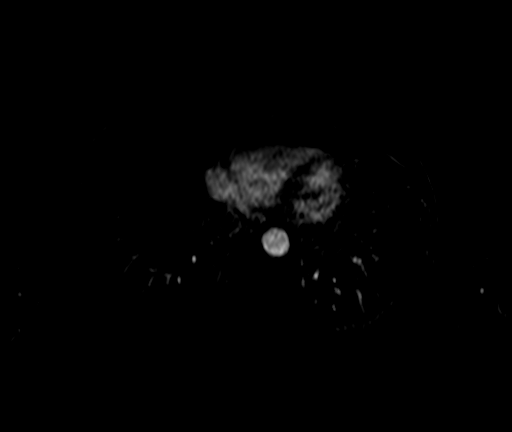

[25 of 48 positions shown; findings below may reference images not displayed]

FINDINGS: Lower chest: No acute findings.

Hepatobiliary: No hepatic masses identified. Multiple tiny cysts are
noted in both right and left lobes. Gallbladder is unremarkable. No
evidence of biliary ductal dilatation or choledocholithiasis.

Pancreas: No evidence of pancreatic mass or ductal dilatation. No
evidence of peripancreatic inflammatory changes or fluid
collections.

Spleen:  Within normal limits in size and appearance.

Adrenals/Urinary Tract: No masses identified. No evidence of
hydronephrosis.

Stomach/Bowel: Visualized portion unremarkable.

Vascular/Lymphatic: No pathologically enlarged lymph nodes
identified. No abdominal aortic aneurysm.

Other:  None.

Musculoskeletal:  No suspicious bone lesions identified.
IMPRESSION: No evidence of pancreatic mass. No evidence of abdominal metastatic
disease.

## 2020-03-09 MED ORDER — GADOBENATE DIMEGLUMINE 529 MG/ML IV SOLN
11.0000 mL | Freq: Once | INTRAVENOUS | Status: AC | PRN
  Administered 2020-03-09: 11 mL via INTRAVENOUS

## 2020-03-16 ENCOUNTER — Encounter (INDEPENDENT_AMBULATORY_CARE_PROVIDER_SITE_OTHER): Payer: Self-pay

## 2020-03-16 ENCOUNTER — Telehealth: Payer: Self-pay

## 2020-03-16 NOTE — Telephone Encounter (Signed)
Pt called requesting to change her screen appt as she reports getting here from [facility] for short appt is inconvenient for her but she will be in town this Thursday so let pt know it would be okay for her to come by for a screen at that time. She feels she has started developing some axilla swelling so she will benefit from performing SOZO for updated Lymphedema Index score.

## 2020-03-18 ENCOUNTER — Other Ambulatory Visit: Payer: Self-pay

## 2020-03-18 ENCOUNTER — Ambulatory Visit: Payer: Medicare Other | Attending: General Surgery | Admitting: Physical Therapy

## 2020-03-18 DIAGNOSIS — Z170 Estrogen receptor positive status [ER+]: Secondary | ICD-10-CM | POA: Insufficient documentation

## 2020-03-18 DIAGNOSIS — Z483 Aftercare following surgery for neoplasm: Secondary | ICD-10-CM | POA: Insufficient documentation

## 2020-03-18 DIAGNOSIS — I890 Lymphedema, not elsewhere classified: Secondary | ICD-10-CM

## 2020-03-18 DIAGNOSIS — C50411 Malignant neoplasm of upper-outer quadrant of right female breast: Secondary | ICD-10-CM

## 2020-03-18 NOTE — Therapy (Signed)
Carrie Serrano, [place], [address] Phone: [phone]   Fax:  [phone]  Physical Therapy Treatment  Patient Details  Name: Carrie Serrano MRN: [id number] Date of Birth: [birth year] Referring Provider (PT): Dr. Stark Klein   Encounter Date: 03/18/2020   PT End of Session - 03/18/20 0946    Visit Number 3    PT Start Time 0900    PT Stop Time 0915    PT Time Calculation (min) 15 min    Activity Tolerance Patient tolerated treatment well    Behavior During Therapy [facility] for tasks assessed/performed           Past Medical History:  Diagnosis Date  . HSV-2 (herpes simplex virus 2) infection   . Osteopenia   . PAT (paroxysmal atrial tachycardia) ([place])   . SVT (supraventricular tachycardia) ([facility])     Past Surgical History:  Procedure Laterality Date  . ABDOMINAL HYSTERECTOMY    . ABLATION OF DYSRHYTHMIC FOCUS  06/13/2012   SVT    Dr  Lovena Le  . BREAST LUMPECTOMY WITH RADIOACTIVE SEED AND SENTINEL LYMPH NODE BIOPSY Right 07/01/2019   Procedure: RIGHT BREAST LUMPECTOMY WITH RADIOACTIVE SEED AND SENTINEL LYMPH NODE BIOPSY;  Surgeon: Stark Klein, MD;  Location: [facility];  Service: General;  Laterality: Right;  . COLONOSCOPY  2012  . COLONOSCOPY WITH PROPOFOL N/A 06/26/2016   Procedure: COLONOSCOPY WITH PROPOFOL;  Surgeon: Lollie Sails, MD;  Location: [facility] ENDOSCOPY;  Service: Endoscopy;  Laterality: N/A;  . ELECTROPHYSIOLOGY STUDY N/A 06/13/2012   Procedure: ELECTROPHYSIOLOGY STUDY;  Surgeon: Evans Lance, MD;  Location: [facility] CATH LAB;  Service: Cardiovascular;  Laterality: N/A;  . FLEXIBLE SIGMOIDOSCOPY N/A 01/24/2017   Procedure: FLEXIBLE SIGMOIDOSCOPY;  Surgeon: Toledo, Benay Pike, MD;  Location: [facility] ENDOSCOPY;  Service: Gastroenterology;  Laterality: N/A;  . SUPRAVENTRICULAR TACHYCARDIA ABLATION N/A 06/13/2012   Procedure: SUPRAVENTRICULAR TACHYCARDIA ABLATION;  Surgeon: Evans Lance, MD;  Location: [facility] CATH LAB;  Service: Cardiovascular;  Laterality: N/A;  . varicose vein injection in left lower leg      There were no vitals filed for this visit.   Subjective Assessment - 03/18/20 0940    Subjective Patient is here today for her L-Dex screening. She reports she has some edema in her axilla which she is treating with manual lymph drainage.                  L-DEX FLOWSHEETS - 03/18/20 0900      L-DEX LYMPHEDEMA SCREENING   Measurement Type Unilateral    L-DEX MEASUREMENT EXTREMITY Upper Extremity    POSITION  Standing    DOMINANT SIDE Right    At Risk Side Right    BASELINE SCORE (UNILATERAL) -2.5    L-DEX SCORE (UNILATERAL) 2.1    VALUE CHANGE (UNILAT) 4.6                                         Plan - 03/18/20 0945    Clinical Impression Statement L-Dex score did not exceed 6.5 from baseline    PT Next Visit Plan Continue L-Dex screens every 3 months until 09/26/2021           Patient will benefit from skilled therapeutic intervention in order to improve the following deficits and impairments:     Visit Diagnosis: Lymphedema, not elsewhere classified  Malignant  neoplasm of upper-outer quadrant of right breast in female, estrogen receptor positive [facility])  Aftercare following surgery for neoplasm     Problem List Patient Active Problem List   Diagnosis Date Noted  . Malignant neoplasm of upper-outer quadrant of right breast in female, estrogen receptor positive ([place]) 06/13/2019  . Bilateral hearing loss due to cerumen impaction 11/19/2018  . Gassiness 05/04/2017  . Right wrist pain 10/05/2016  . Varicose veins of both lower extremities with complications 34/06/5246  . Chronic venous insufficiency 06/30/2016  . Contracture of palmar fascia 06/15/2015  . PSVT (paroxysmal supraventricular tachycardia) ([place]) 09/18/2014  . Right bundle branch block 09/18/2014  . RBBB 09/05/2013  . LAFB (left anterior  fascicular block) 09/05/2013  . SVT (supraventricular tachycardia) ([place]) 12/08/2011  . Tobacco abuse 12/08/2011    Carrie Serrano, PT 03/18/2020, 9:47 AM  [place], [place], [address] Phone: [phone]   Fax:  [phone]  Name: Carrie Serrano MRN: [id number] Date of Birth: July 03, 1950

## 2020-03-22 ENCOUNTER — Ambulatory Visit: Payer: Medicare Other

## 2020-03-23 ENCOUNTER — Encounter (INDEPENDENT_AMBULATORY_CARE_PROVIDER_SITE_OTHER): Payer: Self-pay

## 2020-04-06 ENCOUNTER — Encounter (INDEPENDENT_AMBULATORY_CARE_PROVIDER_SITE_OTHER): Payer: Self-pay

## 2020-04-19 ENCOUNTER — Ambulatory Visit: Payer: Medicare Other | Admitting: Radiation Oncology

## 2020-04-26 ENCOUNTER — Ambulatory Visit
Admission: RE | Admit: 2020-04-26 | Discharge: 2020-04-26 | Disposition: A | Discharge status: Home / Self Care | Payer: Medicare Other | Source: Ambulatory Visit | Attending: Radiation Oncology | Admitting: Radiation Oncology

## 2020-04-26 ENCOUNTER — Other Ambulatory Visit: Payer: Self-pay

## 2020-04-26 ENCOUNTER — Encounter: Payer: Self-pay | Admitting: Radiation Oncology

## 2020-04-26 VITALS — BP 114/76 | HR 60 | Temp 96.1°F | Wt 127.0 lb

## 2020-04-26 DIAGNOSIS — C50412 Malignant neoplasm of upper-outer quadrant of left female breast: Secondary | ICD-10-CM

## 2020-04-26 DIAGNOSIS — Z170 Estrogen receptor positive status [ER+]: Secondary | ICD-10-CM

## 2020-04-26 NOTE — Progress Notes (Signed)
Radiation Oncology Follow up Note  Name: Carrie Serrano   Date:   04/26/2020 MRN:  [id number] DOB: 1950/08/20    This 69 y.o. 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  Seen today in routine follow-up she is doing well.  She specifically denies breast tenderness cough or bone pain.  She is currently on Arimidex tolerant well without side effect.  She is scheduled for mammograms in February.  She had an MRI of her abdomen to rule out pancreatic tumor last month which I have reviewed showing no evidence of abdominal pathology.  COMPLICATIONS OF TREATMENT: none  FOLLOW UP COMPLIANCE: keeps appointments   PHYSICAL EXAM:  BP 114/76   Pulse 60   Temp (!) 96.1 F (35.6 C) (Tympanic)   Wt 127 lb (57.6 kg)   BMI 20.50 kg/m  Lungs are clear to A&P cardiac examination essentially unremarkable with regular rate and rhythm. No dominant mass or nodularity is noted in either breast in 2 positions examined. Incision is well-healed. No axillary or supraclavicular adenopathy is appreciated. Cosmetic result is excellent.  Well-developed well-nourished patient in NAD. HEENT reveals PERLA, EOMI, discs not visualized.  Oral cavity is clear. No oral mucosal lesions are identified. Neck is clear without evidence of cervical or supraclavicular adenopathy. Lungs are clear to A&P. Cardiac examination is essentially unremarkable with regular rate and rhythm without murmur rub or thrill. Abdomen is benign with no organomegaly or masses noted. Motor sensory and DTR levels are equal and symmetric in the upper and lower extremities. Cranial nerves II through XII  are grossly intact. Proprioception is intact. No peripheral adenopathy or edema is identified. No motor or sensory levels are noted. Crude visual fields are within normal range.  RADIOLOGY RESULTS: Abdominal MRI reviewed compatible with above-stated findings  PLAN: Present time patient is doing well with no evidence of disease 6 months out I will review her mammograms when they become available.  I am pleased with her overall progress.  I have asked to see her back in 6 months for follow-up.  She continues on Arimidex without side effect.  Patient is to call with any concerns.  I would like to take this opportunity to thank you for allowing me to participate in the care of your patient..Noreene Filbert MD

## 2020-04-27 ENCOUNTER — Encounter (INDEPENDENT_AMBULATORY_CARE_PROVIDER_SITE_OTHER): Payer: Self-pay

## 2020-05-11 ENCOUNTER — Encounter (INDEPENDENT_AMBULATORY_CARE_PROVIDER_SITE_OTHER): Payer: Self-pay

## 2020-06-08 ENCOUNTER — Encounter (INDEPENDENT_AMBULATORY_CARE_PROVIDER_SITE_OTHER): Payer: Self-pay

## 2020-06-14 ENCOUNTER — Ambulatory Visit: Payer: Self-pay

## 2020-06-15 ENCOUNTER — Other Ambulatory Visit (HOSPITAL_COMMUNITY): Payer: Self-pay | Admitting: Physician Assistant

## 2020-06-15 ENCOUNTER — Encounter (INDEPENDENT_AMBULATORY_CARE_PROVIDER_SITE_OTHER): Payer: Self-pay

## 2020-06-15 ENCOUNTER — Other Ambulatory Visit: Payer: Self-pay

## 2020-06-15 ENCOUNTER — Ambulatory Visit
Admission: RE | Admit: 2020-06-15 | Discharge: 2020-06-15 | Disposition: A | Discharge status: Home / Self Care | Payer: Medicare Other | Source: Ambulatory Visit | Attending: Adult Health | Admitting: Adult Health

## 2020-06-15 DIAGNOSIS — Z170 Estrogen receptor positive status [ER+]: Secondary | ICD-10-CM

## 2020-06-15 DIAGNOSIS — C50411 Malignant neoplasm of upper-outer quadrant of right female breast: Secondary | ICD-10-CM

## 2020-06-15 HISTORY — DX: Personal history of irradiation: Z92.3

## 2020-06-15 IMAGING — MG DIGITAL DIAGNOSTIC BILAT W/ TOMO W/ CAD
9 series · 9 of 25 positions shown · non-contrast
Comparison: Previous exam(s).

CLINICAL DATA: 69-year-old female for annual follow-up. History of
RIGHT breast cancer and lumpectomy in [9G].

EXAM:
DIGITAL DIAGNOSTIC BILATERAL MAMMOGRAM WITH CAD AND TOMO

[R MLO]
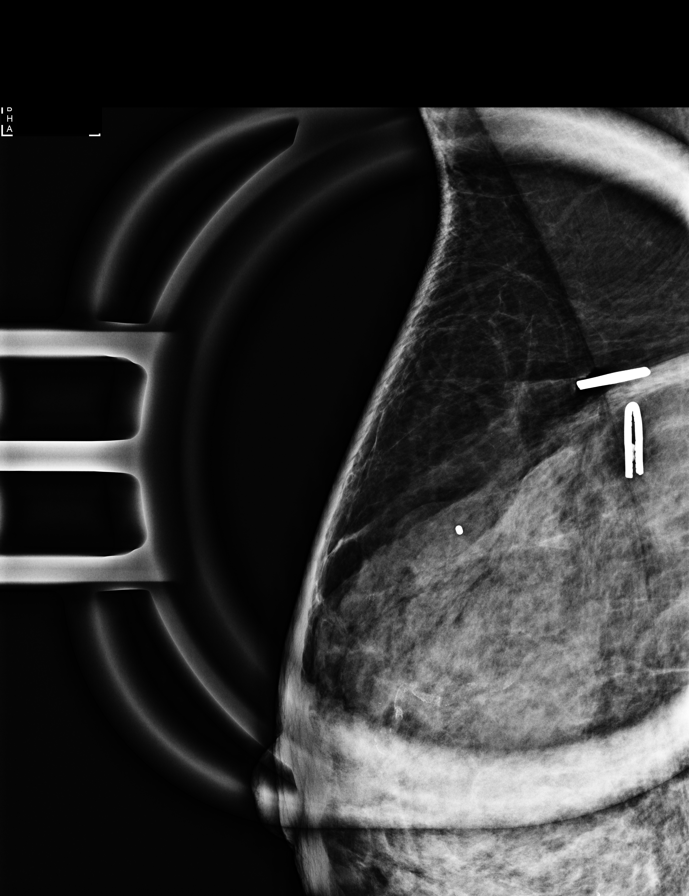

[L CC synth-2D]
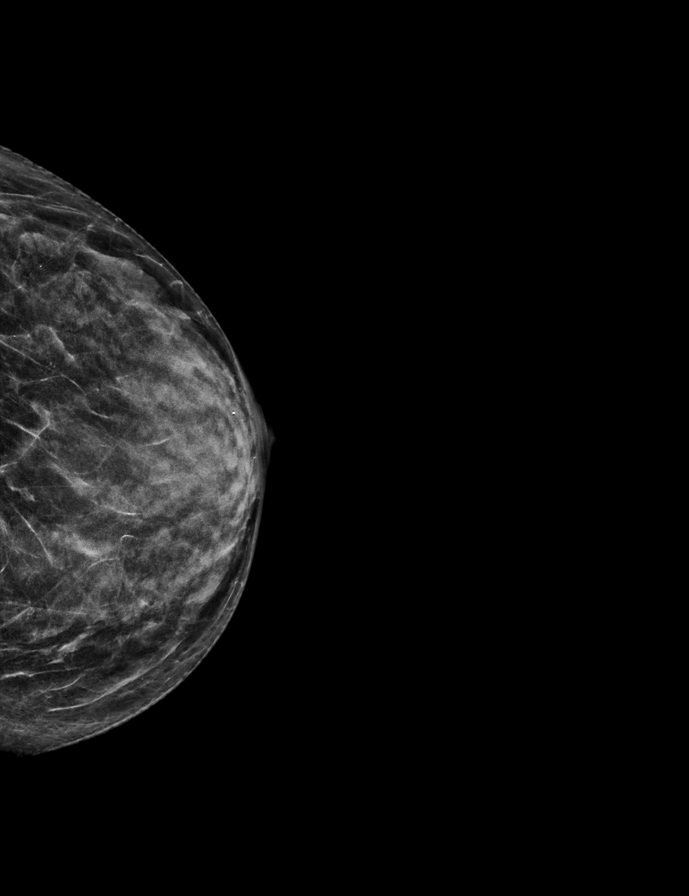

[R CC synth-2D]
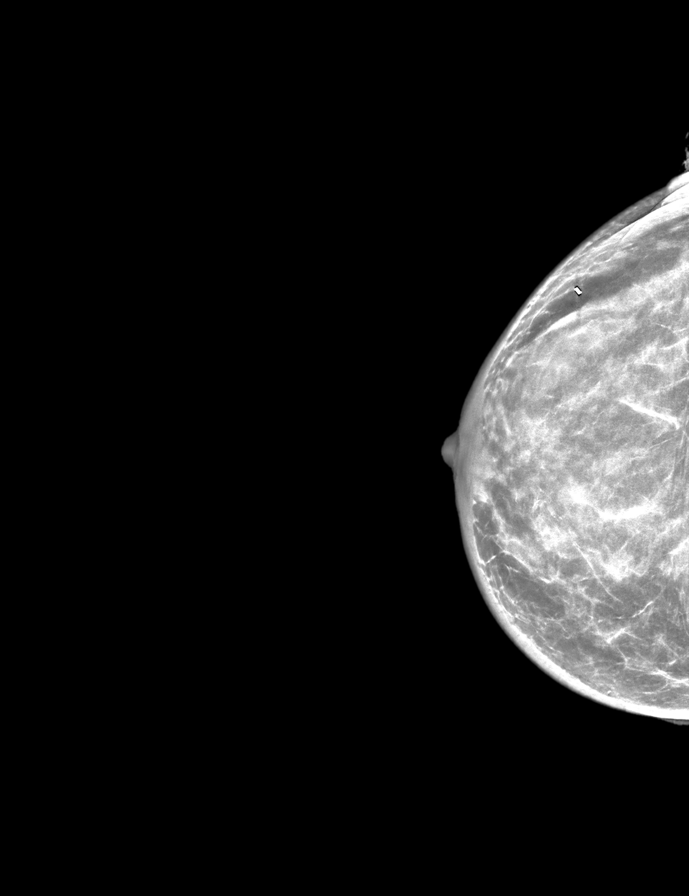

[R MLO synth-2D]
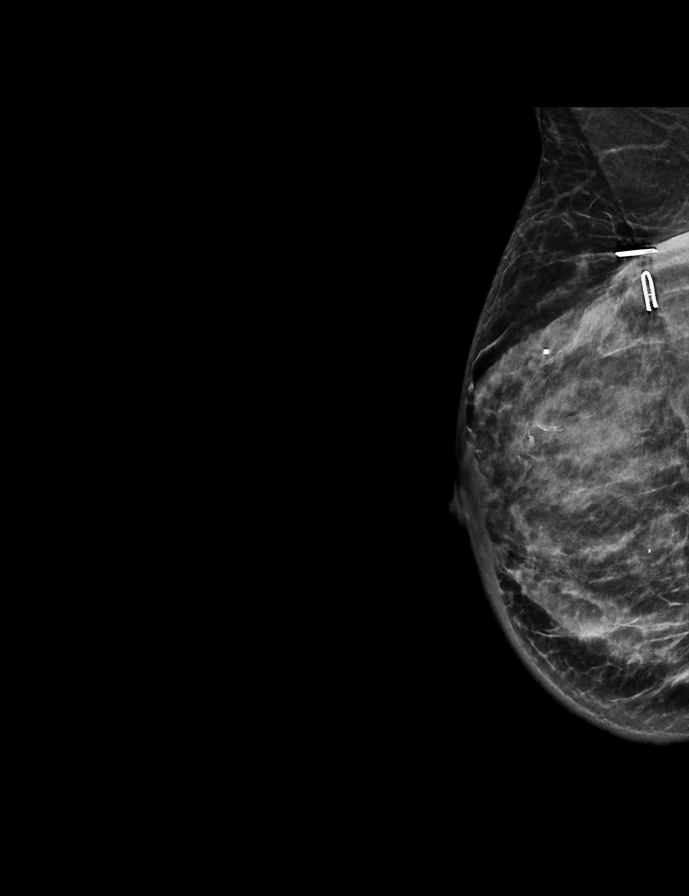

[L MLO synth-2D]
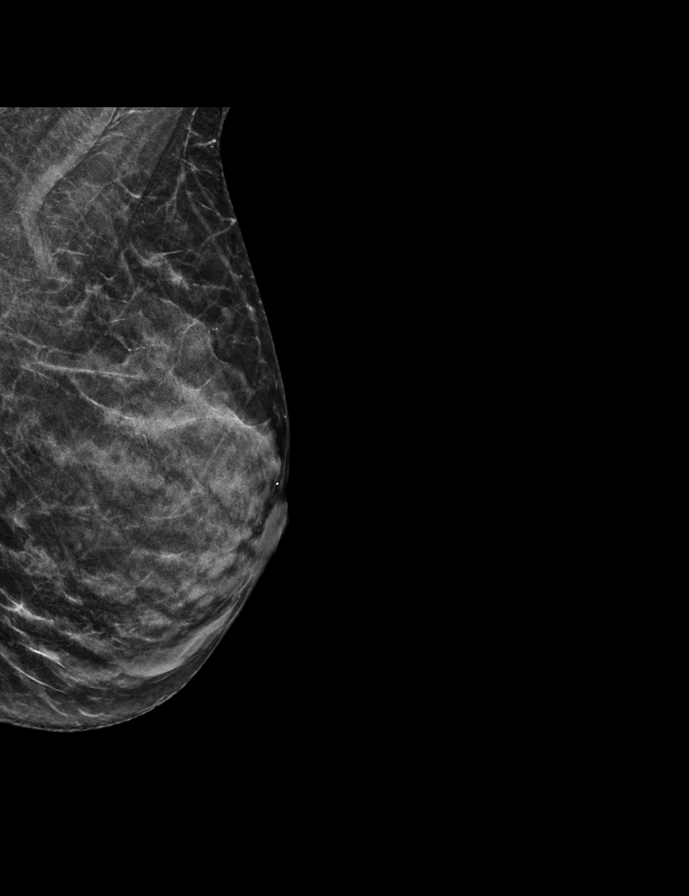

[R MLO tomo · tomo slice 29/56.0]
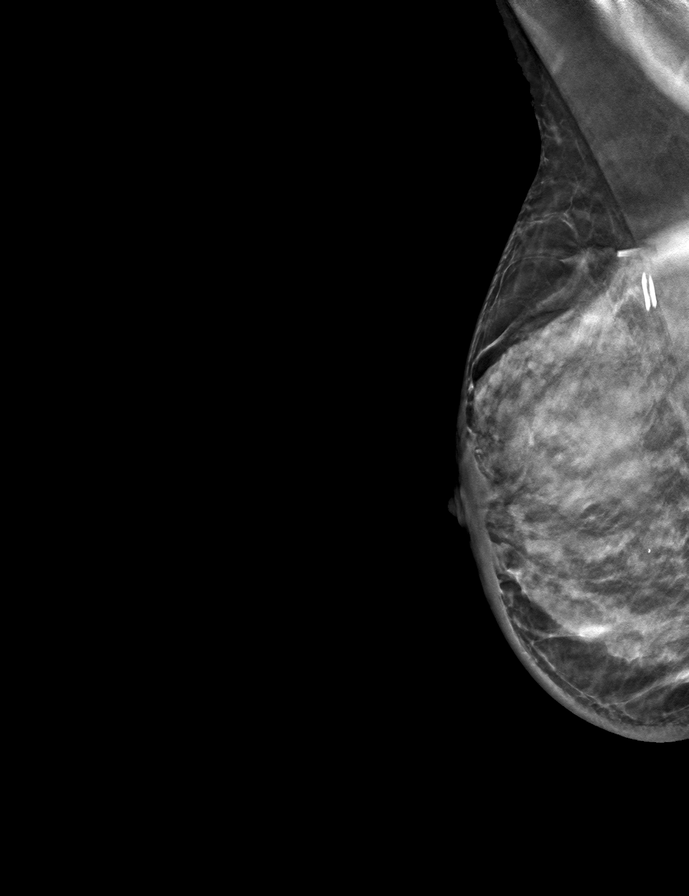

[L CC tomo · tomo slice 22/43.0]
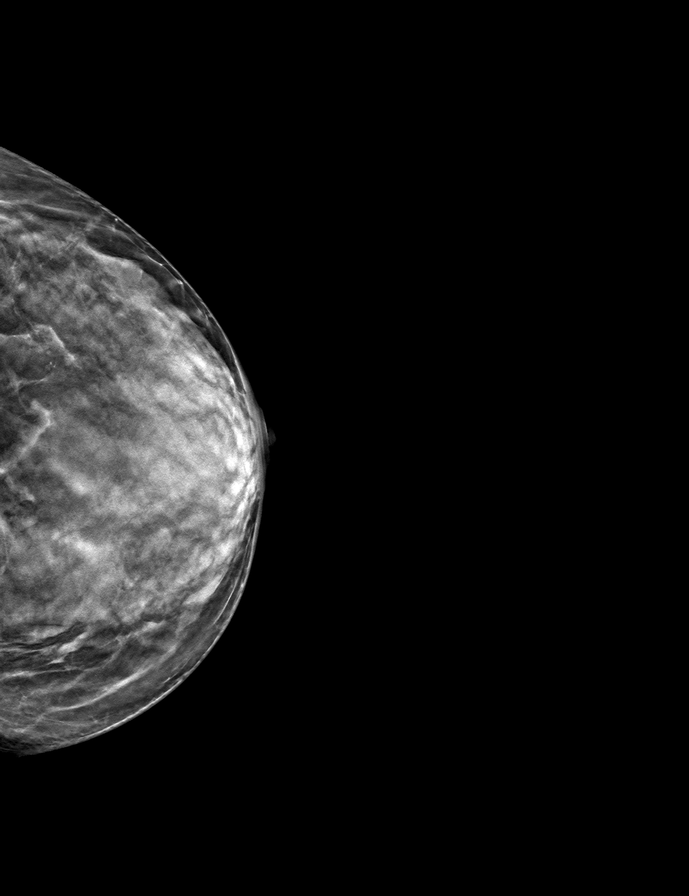

[R CC tomo · tomo slice 32/63.0]
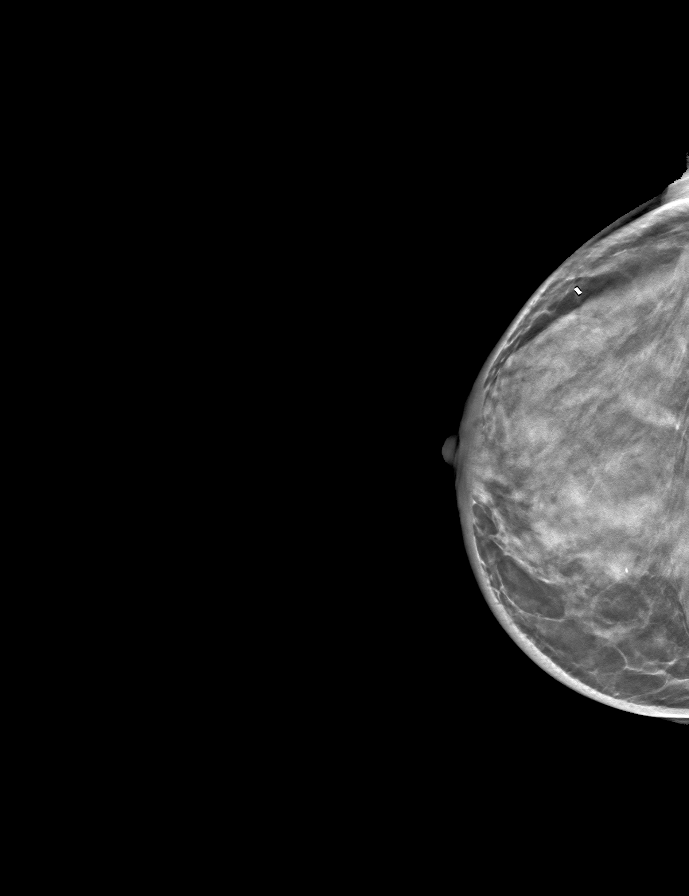

[L MLO tomo · tomo slice 21/41.0]
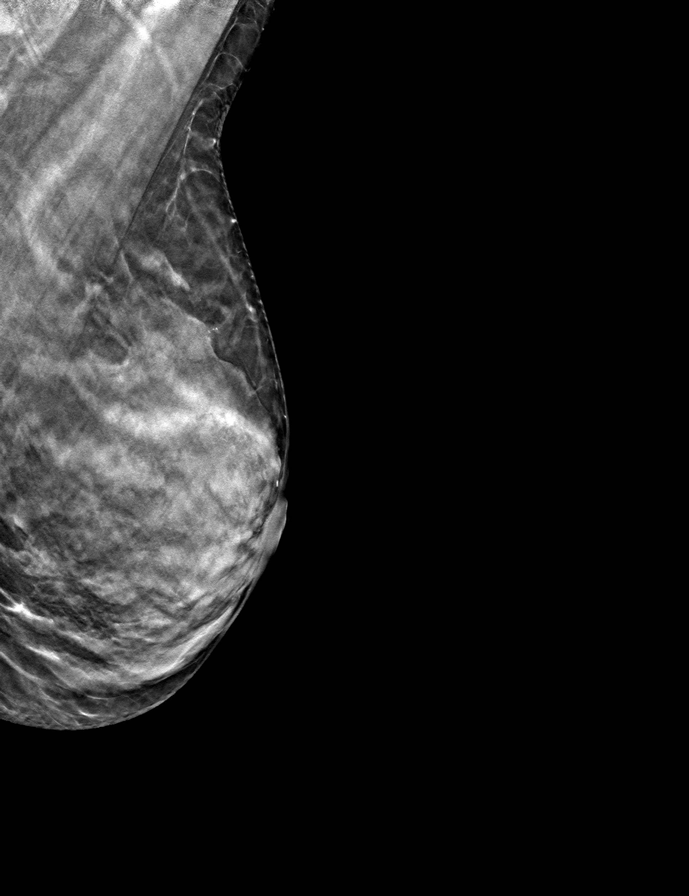

[9 of 25 positions shown; findings below may reference images not displayed]

ACR Breast Density Category d: The breast tissue is extremely dense,
which lowers the sensitivity of mammography.
FINDINGS: 2D and 3D full field views of both breasts and a magnification view
of the lumpectomy site demonstrate no suspicious mass, nonsurgical
distortion or worrisome calcifications.

RIGHT lumpectomy changes are noted.

Mammographic images were processed with CAD.
IMPRESSION: No evidence of breast malignancy.

RECOMMENDATION:
Bilateral diagnostic mammogram in 1 year.

I have discussed the findings and recommendations with the patient.
If applicable, a reminder letter will be sent to the patient
regarding the next appointment.

BI-RADS CATEGORY  2: Benign.

## 2020-06-23 NOTE — Assessment & Plan Note (Signed)
07/01/2019:Right lumpectomy [facility]): IDC, 1.3cm, grade 2, clear margins, 2 right axillary lymph nodes negative.ER 100%, PR 90%, Ki-67 15%, HER-2 negative by FISH T1c N0 stage Ia Oncotype DX score 6: Distant recurrence at 9 years: 3% Adjuvant radiation 08/12/2019-09/15/2019  Treatment plan: Adjuvant antiestrogen therapy with anastrozole 1 mg daily x5 to 7 years  Anastrozole Toxicities:  Breast Cancer Surveillance: 1. Breast Exam: 06/24/20: Benign 2. Mammograms: 06/15/20: Benign Breast Density Cat D  RTC in 1 year

## 2020-06-23 NOTE — Progress Notes (Signed)
Patient Care Team: [name], [place] as PCP - General (Nurse Practitioner) Jerline Pain, MD as PCP - Cardiology (Cardiology) Stark Klein, MD as Consulting Physician (General Surgery) Nicholas Lose, MD as Consulting Physician (Hematology and Oncology) Gery Pray, MD as Consulting Physician (Radiation Oncology) Noreene Filbert, MD as Radiation Oncologist (Radiation Oncology)  DIAGNOSIS:    ICD-10-CM   1. Malignant neoplasm of upper-outer quadrant of right breast in female, estrogen receptor positive ([place])  C50.411    Z17.0     SUMMARY OF ONCOLOGIC HISTORY: Oncology History  Malignant neoplasm of upper-outer quadrant of right breast in female, estrogen receptor positive ([place])  06/13/2019 Initial Diagnosis   Screening mammogram detected a right breast mass, palpable on exam. Diagnostic mammogram showed a 1.0cm mass in the right breast at the 10:30 position. Biopsy showed invasive mammary carcinoma, grade 1, HER-2 equivocal by IHC, negative by FISH, ER+ 100%, PR+ 90%, Ki67 15%   06/18/2019 Cancer Staging   Staging form: Breast, AJCC 8th Edition - Clinical stage from 06/18/2019: cT1b, cN0, cM0, G2, ER+, PR+, HER2: Unknown   07/01/2019 Surgery   Right lumpectomy [facility]) ([id number]): IDC, 1.3cm, grade 2, clear margins, 2 right axillary lymph nodes negative.   07/01/2019 Cancer Staging   Staging form: Breast, AJCC 8th Edition - Pathologic stage from 07/01/2019: Stage IA (pT1c, pN0, cM0, G2, ER+, PR+, HER2-)   07/16/2019 Oncotype testing   The Oncotype DX score was 6 predicting a risk of outside the breast recurrence over the next 9 years of 3% if the patient's only systemic therapy is Anastrozole for 5 years.    08/13/2019 - 09/15/2019 Radiation Therapy   The patient initially received a dose of 42.56 Gy in 16 fractions to the breast using whole-breast tangent fields. This was delivered using a 3-D conformal technique. The pt received a boost delivering an additional 16 Gy in 8  fractions using a electron boost with 48mV electrons. The total dose was 58.56 Gy.   09/2019 - 09/2024 Anti-estrogen oral therapy   Anastrozole     CHIEF COMPLIANT: Follow-up of right breast cancer on anastrozole   INTERVAL HISTORY: Carrie Serrano a 70y.o. with above-mentioned history of right breast cancer who underwent a lumpectomy, radiation, and is currently on antiestrogen therapy with anastrozole. She presents to the clinic today for follow-up.  She is tolerating anastrozole extremely well without any problems or concerns.  She denies any hot flashes or arthralgias.  She has some stiffness under the right arm but she is able to have good range of motion.  ALLERGIES:  is allergic to latex.  MEDICATIONS:  Current Outpatient Medications  Medication Sig Dispense Refill   anastrozole (ARIMIDEX) 1 MG tablet Take 1 tablet (1 mg total) by mouth daily. 90 tablet 3   Calcium Carbonate-Vit D-Min (CALCIUM 1200) 1200-1000 MG-UNIT CHEW Chew 1 tablet by mouth daily. (Patient not taking: Reported on 04/26/2020)     denosumab (PROLIA) 60 MG/ML SOLN injection Inject 60 mg into the skin every 6 (six) months. Administer in upper arm, thigh, or abdomen     valACYclovir (VALTREX) 500 MG tablet Take 500 mg by mouth 2 (two) times daily as needed. For outbreak     vitamin B-12 (CYANOCOBALAMIN) 1000 MCG tablet Take 1,000 mcg by mouth daily. (Patient not taking: Reported on 04/26/2020)     Vitamin D, Cholecalciferol, 400 units CAPS Take 800 Units by mouth. (Patient not taking: Reported on 04/26/2020)     No current facility-administered medications for this  visit.    PHYSICAL EXAMINATION: ECOG PERFORMANCE STATUS: 1 - Symptomatic but completely ambulatory  Vitals:   06/24/20 0859  BP: 129/75  Pulse: (!) 52  Resp: 18  Temp: (!) 97.5 F (36.4 C)  SpO2: 100%   Filed Weights   06/24/20 0859  Weight: 126 lb 11.2 oz (57.5 kg)    BREAST: No palpable masses or nodules in either right or left  breasts. No palpable axillary supraclavicular or infraclavicular adenopathy no breast tenderness or nipple discharge. (exam performed in the presence of a chaperone)  LABORATORY DATA:  I have reviewed the data as listed CMP Latest Ref Rng & Units 06/18/2019 06/04/2012  Glucose 70 - 99 mg/dL 96 71  BUN 8 - 23 mg/dL 15 17  Creatinine 0.44 - 1.00 mg/dL 0.85 0.9  Sodium 135 - 145 mmol/L 141 140  Potassium 3.5 - 5.1 mmol/L 4.8 4.6  Chloride 98 - 111 mmol/L 107 105  CO2 22 - 32 mmol/L 26 28  Calcium 8.9 - 10.3 mg/dL 9.5 9.5  Total Protein 6.5 - 8.1 g/dL 6.8 -  Total Bilirubin 0.3 - 1.2 mg/dL 0.6 -  Alkaline Phos 38 - 126 U/L 75 -  AST 15 - 41 U/L 28 -  ALT 0 - 44 U/L 26 -    Lab Results  Component Value Date   WBC 5.2 09/10/2019   HGB 14.5 09/10/2019   HCT 42.8 09/10/2019   MCV 97.7 09/10/2019   PLT 206 09/10/2019   NEUTROABS 3.2 06/18/2019    ASSESSMENT & PLAN:  Malignant neoplasm of upper-outer quadrant of right breast in female, estrogen receptor positive ([place]) 07/01/2019:Right lumpectomy ([place]): IDC, 1.3cm, grade 2, clear margins, 2 right axillary lymph nodes negative.ER 100%, PR 90%, Ki-67 15%, HER-2 negative by FISH T1c N0 stage Ia Oncotype DX score 6: Distant recurrence at 9 years: 3% Adjuvant radiation 08/12/2019-09/15/2019  Treatment plan: Adjuvant antiestrogen therapy with anastrozole 1 mg daily x5 to 7 years  Anastrozole Toxicities: Tolerating it extremely well without any hot flashes or arthralgias or myalgias. She receives Prolia from her [place]. She exercises regularly and goes to the gym.  Breast Cancer Surveillance: 1. Breast Exam: 06/24/20: Benign 2. Mammograms: 06/15/20: Benign Breast Density Cat D  RTC in 1 year    No orders of the defined types were placed in this encounter.  The patient has a good understanding of the overall plan. she agrees with it. she will call with any problems that may develop before the next visit here.  Total time  spent: 20 mins including face to face time and time spent for planning, charting and coordination of care  Rulon Eisenmenger, MD, MPH 06/24/2020  I, Cloyde Reams Dorshimer, am acting as scribe for Dr. Nicholas Lose.  I have reviewed the above documentation for accuracy and completeness, and I agree with the above.

## 2020-06-24 ENCOUNTER — Other Ambulatory Visit: Payer: Self-pay | Admitting: Hematology and Oncology

## 2020-06-24 ENCOUNTER — Inpatient Hospital Stay: Payer: Medicare Other | Attending: Hematology and Oncology | Admitting: Hematology and Oncology

## 2020-06-24 ENCOUNTER — Telehealth: Payer: Self-pay | Admitting: Hematology and Oncology

## 2020-06-24 ENCOUNTER — Other Ambulatory Visit: Payer: Self-pay

## 2020-06-24 DIAGNOSIS — Z170 Estrogen receptor positive status [ER+]: Secondary | ICD-10-CM | POA: Insufficient documentation

## 2020-06-24 DIAGNOSIS — C50411 Malignant neoplasm of upper-outer quadrant of right female breast: Secondary | ICD-10-CM | POA: Insufficient documentation

## 2020-06-24 DIAGNOSIS — Z79811 Long term (current) use of aromatase inhibitors: Secondary | ICD-10-CM | POA: Diagnosis not present

## 2020-06-24 MED ORDER — ANASTROZOLE 1 MG PO TABS: 1.0000 mg | ORAL_TABLET | Freq: Every day | ORAL | 3 refills | Status: DC

## 2020-06-24 NOTE — Telephone Encounter (Signed)
Scheduled appts per 2/24 los. Pt stated she would refer to mychart for appts

## 2020-07-26 ENCOUNTER — Ambulatory Visit: Payer: Self-pay

## 2020-07-27 ENCOUNTER — Encounter (INDEPENDENT_AMBULATORY_CARE_PROVIDER_SITE_OTHER): Payer: Self-pay

## 2020-08-24 ENCOUNTER — Encounter (INDEPENDENT_AMBULATORY_CARE_PROVIDER_SITE_OTHER): Payer: Self-pay

## 2020-08-31 ENCOUNTER — Encounter (INDEPENDENT_AMBULATORY_CARE_PROVIDER_SITE_OTHER): Payer: Self-pay

## 2020-09-07 ENCOUNTER — Encounter (INDEPENDENT_AMBULATORY_CARE_PROVIDER_SITE_OTHER): Payer: Self-pay

## 2020-09-30 ENCOUNTER — Other Ambulatory Visit (HOSPITAL_COMMUNITY): Payer: Self-pay

## 2020-09-30 MED FILL — Anastrozole Tab 1 MG: ORAL | 90 days supply | Qty: 90 | Fill #0 | Status: AC

## 2020-10-25 ENCOUNTER — Ambulatory Visit
Admission: RE | Admit: 2020-10-25 | Discharge: 2020-10-25 | Disposition: A | Discharge status: Home / Self Care | Payer: Medicare Other | Source: Ambulatory Visit | Attending: Radiation Oncology | Admitting: Radiation Oncology

## 2020-10-25 ENCOUNTER — Other Ambulatory Visit: Payer: Self-pay

## 2020-10-25 VITALS — BP 121/75 | HR 62 | Temp 96.9°F | Resp 12 | Wt 127.6 lb

## 2020-10-25 DIAGNOSIS — Z923 Personal history of irradiation: Secondary | ICD-10-CM | POA: Insufficient documentation

## 2020-10-25 DIAGNOSIS — Z170 Estrogen receptor positive status [ER+]: Secondary | ICD-10-CM | POA: Insufficient documentation

## 2020-10-25 DIAGNOSIS — C50412 Malignant neoplasm of upper-outer quadrant of left female breast: Secondary | ICD-10-CM

## 2020-10-25 DIAGNOSIS — Z79811 Long term (current) use of aromatase inhibitors: Secondary | ICD-10-CM | POA: Diagnosis not present

## 2020-10-25 DIAGNOSIS — C50411 Malignant neoplasm of upper-outer quadrant of right female breast: Secondary | ICD-10-CM | POA: Diagnosis present

## 2020-10-25 NOTE — Progress Notes (Signed)
Radiation Oncology Follow up Note  Name: Carrie Serrano   Date:   10/25/2020 MRN:  [id number] DOB: 1950/09/08    This 70 y.o. female presents to the clinic today for 1 year follow-up status post whole breast radiation to her right breast for stage I ER/PR positive HER2 negative invasive mammary carcinoma.  REFERRING PROVIDER: [name], PA  HPI: Patient is a 70 year old female now at 1 year having completed whole breast radiation for stage I ER/PR positive HER2 negative invasive mammary carcinoma seen today in routine follow-up she is doing well.  She specifically denies breast tenderness cough or bone pain..  She is currently on Arimidex tolerating it well without side effectPatient's mammograms performed in February were BI-RADS 2 benign which I reviewed  COMPLICATIONS OF TREATMENT: none  FOLLOW UP COMPLIANCE: keeps appointments   PHYSICAL EXAM:  BP 121/75   Pulse 62   Temp (!) 96.9 F (36.1 C) (Tympanic)   Resp 12   Wt 127 lb 9.6 oz (57.9 kg)   BMI 20.60 kg/m  Lungs are clear to A&P cardiac examination essentially unremarkable with regular rate and rhythm. No dominant mass or nodularity is noted in either breast in 2 positions examined. Incision is well-healed. No axillary or supraclavicular adenopathy is appreciated. Cosmetic result is excellent.  Since well-developed well-nourished patient in NAD. HEENT reveals PERLA, EOMI, discs not visualized.  Oral cavity is clear. No oral mucosal lesions are identified. Neck is clear without evidence of cervical or supraclavicular adenopathy. Lungs are clear to A&P. Cardiac examination is essentially unremarkable with regular rate and rhythm without murmur rub or thrill. Abdomen is benign with no organomegaly or masses noted. Motor sensory and DTR levels are equal and symmetric in the upper and lower extremities. Cranial nerves II through XII are grossly intact. Proprioception is intact. No peripheral adenopathy or edema is identified. No  motor or sensory levels are noted. Crude visual fields are within normal range.  RADIOLOGY RESULTS: Mammograms reviewed compatible with above-stated findings  PLAN:  present time patient is doing well with no evidence of disease.  And pleased with her overall progress.  I have asked to see her back in 1 year for follow-up.  She continues on Arimidex without side effect.  Patient is to call with any concerns.  I would like to take this opportunity to thank you for allowing me to participate in the care of your patient.Noreene Filbert, MD

## 2020-11-18 ENCOUNTER — Other Ambulatory Visit: Payer: Self-pay

## 2020-11-18 MED ORDER — BETAMETHASONE DIPROPIONATE 0.05 % EX CREA
TOPICAL_CREAM | CUTANEOUS | 1 refills | Status: DC
  Filled 2020-11-18: qty 45, 15d supply, fill #0

## 2020-11-18 MED ORDER — TACROLIMUS 0.1 % EX OINT
TOPICAL_OINTMENT | CUTANEOUS | 1 refills | Status: DC
  Filled 2020-11-18: qty 60, 15d supply, fill #0

## 2020-11-19 ENCOUNTER — Other Ambulatory Visit: Payer: Self-pay

## 2020-12-28 ENCOUNTER — Other Ambulatory Visit: Payer: Self-pay

## 2020-12-28 MED FILL — Anastrozole Tab 1 MG: ORAL | 90 days supply | Qty: 90 | Fill #1 | Status: AC

## 2020-12-30 ENCOUNTER — Other Ambulatory Visit: Payer: Self-pay

## 2020-12-30 MED ORDER — ZOSTER VAC RECOMB ADJUVANTED 50 MCG/0.5ML IM SUSR
0.5000 mL | INTRAMUSCULAR | 1 refills | Status: AC
  Filled 2020-12-30: qty 1, 1d supply, fill #0
  Filled 2021-04-29: qty 1, 1d supply, fill #1

## 2020-12-30 MED ORDER — VALACYCLOVIR HCL 500 MG PO TABS
ORAL_TABLET | ORAL | 2 refills | Status: DC
  Filled 2020-12-30: qty 30, 30d supply, fill #0
  Filled 2021-05-02: qty 30, 30d supply, fill #1

## 2021-02-07 ENCOUNTER — Other Ambulatory Visit: Payer: Self-pay

## 2021-02-22 ENCOUNTER — Other Ambulatory Visit: Payer: Self-pay

## 2021-02-22 ENCOUNTER — Ambulatory Visit: Payer: Medicare Other | Attending: Internal Medicine

## 2021-02-22 DIAGNOSIS — Z23 Encounter for immunization: Secondary | ICD-10-CM

## 2021-02-22 MED ORDER — PFIZER COVID-19 VAC BIVALENT 30 MCG/0.3ML IM SUSP
INTRAMUSCULAR | 0 refills | Status: DC
  Filled 2021-02-22: qty 0.3, 1d supply, fill #0

## 2021-02-22 NOTE — Progress Notes (Signed)
   Covid-19 Vaccination Clinic  Name:  Carrie Serrano    MRN: [id number] DOB: Apr 24, 1951  02/22/2021  [name] was observed post Covid-19 immunization for 15 minutes without incident. She was provided with Vaccine Information Sheet and instruction to access the V-Safe system.   [name] was instructed to call 911 with any severe reactions post vaccine: Difficulty breathing  Swelling of face and throat  A fast heartbeat  A bad rash all over body  Dizziness and weakness   Immunizations Administered     Name Date Dose VIS Date Route   Pfizer Covid-19 Vaccine Bivalent Booster 02/22/2021  9:20 AM 0.3 mL 12/29/2020 Intramuscular   Manufacturer: [place]   Lot: [place]: [place], PharmD, MBA Clinical Acute Care Pharmacist

## 2021-04-04 ENCOUNTER — Other Ambulatory Visit (HOSPITAL_COMMUNITY): Payer: Self-pay

## 2021-04-04 ENCOUNTER — Other Ambulatory Visit: Payer: Self-pay

## 2021-04-04 MED FILL — Anastrozole Tab 1 MG: ORAL | 90 days supply | Qty: 90 | Fill #2 | Status: CN

## 2021-04-04 MED FILL — Anastrozole Tab 1 MG: ORAL | 90 days supply | Qty: 90 | Fill #0 | Status: AC

## 2021-04-29 ENCOUNTER — Other Ambulatory Visit: Payer: Self-pay

## 2021-05-02 ENCOUNTER — Other Ambulatory Visit: Payer: Self-pay

## 2021-05-18 ENCOUNTER — Other Ambulatory Visit: Payer: Self-pay | Admitting: Hematology and Oncology

## 2021-05-18 DIAGNOSIS — Z853 Personal history of malignant neoplasm of breast: Secondary | ICD-10-CM

## 2021-06-06 ENCOUNTER — Other Ambulatory Visit: Payer: Self-pay | Admitting: Physician Assistant

## 2021-06-06 DIAGNOSIS — Z853 Personal history of malignant neoplasm of breast: Secondary | ICD-10-CM

## 2021-06-21 ENCOUNTER — Other Ambulatory Visit: Payer: Self-pay

## 2021-06-21 ENCOUNTER — Ambulatory Visit
Admission: RE | Admit: 2021-06-21 | Discharge: 2021-06-21 | Disposition: A | Discharge status: Home / Self Care | Payer: Medicare Other | Source: Ambulatory Visit | Attending: Physician Assistant | Admitting: Physician Assistant

## 2021-06-21 DIAGNOSIS — Z853 Personal history of malignant neoplasm of breast: Secondary | ICD-10-CM

## 2021-06-27 NOTE — Progress Notes (Signed)
Patient Care Team: [name], [place] as PCP - General (Nurse Practitioner) Jerline Pain, MD as PCP - Cardiology (Cardiology) Stark Klein, MD as Consulting Physician (General Surgery) Nicholas Lose, MD as Consulting Physician (Hematology and Oncology) Gery Pray, MD as Consulting Physician (Radiation Oncology) Noreene Filbert, MD as Radiation Oncologist (Radiation Oncology)  DIAGNOSIS:    ICD-10-CM   1. Malignant neoplasm of upper-outer quadrant of right breast in female, estrogen receptor positive ([place])  C50.411    Z17.0       SUMMARY OF ONCOLOGIC HISTORY: Oncology History  Malignant neoplasm of upper-outer quadrant of right breast in female, estrogen receptor positive ([place])  06/13/2019 Initial Diagnosis   Screening mammogram detected a right breast mass, palpable on exam. Diagnostic mammogram showed a 1.0cm mass in the right breast at the 10:30 position. Biopsy showed invasive mammary carcinoma, grade 1, HER-2 equivocal by IHC, negative by FISH, ER+ 100%, PR+ 90%, Ki67 15%   06/18/2019 Cancer Staging   Staging form: Breast, AJCC 8th Edition - Clinical stage from 06/18/2019: cT1b, cN0, cM0, G2, ER+, PR+, HER2: Unknown   07/01/2019 Surgery   Right lumpectomy [facility]) ([id number]): IDC, 1.3cm, grade 2, clear margins, 2 right axillary lymph nodes negative.   07/01/2019 Cancer Staging   Staging form: Breast, AJCC 8th Edition - Pathologic stage from 07/01/2019: Stage IA (pT1c, pN0, cM0, G2, ER+, PR+, HER2-)   07/16/2019 Oncotype testing   The Oncotype DX score was 6 predicting a risk of outside the breast recurrence over the next 9 years of 3% if the patient's only systemic therapy is Anastrozole for 5 years.    08/13/2019 - 09/15/2019 Radiation Therapy   The patient initially received a dose of 42.56 Gy in 16 fractions to the breast using whole-breast tangent fields. This was delivered using a 3-D conformal technique. The pt received a boost delivering an additional 16 Gy in 8  fractions using a electron boost with 29mV electrons. The total dose was 58.56 Gy.   09/2019 - 09/2024 Anti-estrogen oral therapy   Anastrozole     CHIEF COMPLIANT: Follow-up of right breast cancer on anastrozole  INTERVAL HISTORY: Carrie Serrano a 71y.o. with above-mentioned history of breast cancer who underwent a lumpectomy, radiation, and is currently on antiestrogen therapy with anastrozole. Mammogram on 06/21/2021 showed no evidence of malignancy. She presents to the clinic today for follow-up.  She is tolerating anastrozole extremely well without any problems or concerns but denies symptoms or nodules in the breast.  ALLERGIES:  is allergic to latex.  MEDICATIONS:  Current Outpatient Medications  Medication Sig Dispense Refill   anastrozole (ARIMIDEX) 1 MG tablet TAKE 1 TABLET (1 MG TOTAL) BY MOUTH DAILY. 90 tablet 3   betamethasone dipropionate 0.05 % cream Apply topically to rash on back twice daily for 2 weeks 45 g 1   Calcium Carbonate-Vit D-Min (CALCIUM 1200) 1200-1000 MG-UNIT CHEW Chew 1 tablet by mouth daily.     COVID-19 mRNA bivalent vaccine, Pfizer, (PFIZER COVID-19 VAC BIVALENT) injection Inject into the muscle. 0.3 mL 0   denosumab (PROLIA) 60 MG/ML SOLN injection Inject 60 mg into the skin every 6 (six) months. Administer in upper arm, thigh, or abdomen     tacrolimus (PROTOPIC) 0.1 % ointment Apply topically to rash on back twice daily for 2 weeks 60 g 1   valACYclovir (VALTREX) 500 MG tablet Take 500 mg by mouth 2 (two) times daily as needed. For outbreak     valACYclovir (VALTREX) 500 MG tablet  take 1 po qd 30 tablet 2   vitamin B-12 (CYANOCOBALAMIN) 1000 MCG tablet Take 1,000 mcg by mouth daily.     Vitamin D, Cholecalciferol, 400 units CAPS Take 800 Units by mouth.     Zoster Vaccine Adjuvanted [facility]) injection Inject 0.5 mLs into the muscle. 1 each 1   No current facility-administered medications for this visit.    PHYSICAL EXAMINATION: ECOG  PERFORMANCE STATUS: 1 - Symptomatic but completely ambulatory  Vitals:   06/28/21 0901  BP: 124/75  Pulse: 65  Resp: 16  Temp: (!) 97.5 F (36.4 C)  SpO2: 100%   Filed Weights   06/28/21 0901  Weight: 132 lb 4.8 oz (60 kg)    BREAST: No palpable masses or nodules in either right or left breasts. No palpable axillary supraclavicular or infraclavicular adenopathy no breast tenderness or nipple discharge. (exam performed in the presence of a chaperone)  LABORATORY DATA:  I have reviewed the data as listed CMP Latest Ref Rng & Units 06/18/2019 06/04/2012  Glucose 70 - 99 mg/dL 96 71  BUN 8 - 23 mg/dL 15 17  Creatinine 0.44 - 1.00 mg/dL 0.85 0.9  Sodium 135 - 145 mmol/L 141 140  Potassium 3.5 - 5.1 mmol/L 4.8 4.6  Chloride 98 - 111 mmol/L 107 105  CO2 22 - 32 mmol/L 26 28  Calcium 8.9 - 10.3 mg/dL 9.5 9.5  Total Protein 6.5 - 8.1 g/dL 6.8 -  Total Bilirubin 0.3 - 1.2 mg/dL 0.6 -  Alkaline Phos 38 - 126 U/L 75 -  AST 15 - 41 U/L 28 -  ALT 0 - 44 U/L 26 -    Lab Results  Component Value Date   WBC 5.2 09/10/2019   HGB 14.5 09/10/2019   HCT 42.8 09/10/2019   MCV 97.7 09/10/2019   PLT 206 09/10/2019   NEUTROABS 3.2 06/18/2019    ASSESSMENT & PLAN:  Malignant neoplasm of upper-outer quadrant of right breast in female, estrogen receptor positive ([place]) 07/01/2019: Right lumpectomy ([place]): IDC, 1.3cm, grade 2, clear margins, 2 right axillary lymph nodes negative.  ER 100%, PR 90%, Ki-67 15%, HER-2 negative by FISH T1c N0 stage Ia Oncotype DX score 6: Distant recurrence at 9 years: 3% Adjuvant radiation 08/12/2019-09/15/2019   Treatment plan: Adjuvant antiestrogen therapy with anastrozole 1 mg daily x5 to 7 years started June 2021   Anastrozole Toxicities: Tolerating it extremely well without any hot flashes or arthralgias or myalgias. She receives Prolia from her [place]. She exercises regularly and goes to the gym.   Breast Cancer Surveillance: 1. Breast Exam:  06/28/2021: Benign 2. Mammograms: 06/21/2021: Benign Breast Density Cat D   RTC in 1 year    No orders of the defined types were placed in this encounter.  The patient has a good understanding of the overall plan. she agrees with it. she will call with any problems that may develop before the next visit here.  Total time spent: 20 mins including face to face time and time spent for planning, charting and coordination of care  Rulon Eisenmenger, MD, MPH 06/28/2021  I, Thana Ates, am acting as scribe for Dr. Nicholas Lose.  I have reviewed the above documentation for accuracy and completeness, and I agree with the above.

## 2021-06-28 ENCOUNTER — Other Ambulatory Visit: Payer: Self-pay

## 2021-06-28 ENCOUNTER — Inpatient Hospital Stay: Payer: Medicare Other | Attending: Hematology and Oncology | Admitting: Hematology and Oncology

## 2021-06-28 DIAGNOSIS — C50411 Malignant neoplasm of upper-outer quadrant of right female breast: Secondary | ICD-10-CM | POA: Insufficient documentation

## 2021-06-28 DIAGNOSIS — Z923 Personal history of irradiation: Secondary | ICD-10-CM | POA: Insufficient documentation

## 2021-06-28 DIAGNOSIS — Z79811 Long term (current) use of aromatase inhibitors: Secondary | ICD-10-CM | POA: Insufficient documentation

## 2021-06-28 DIAGNOSIS — Z170 Estrogen receptor positive status [ER+]: Secondary | ICD-10-CM | POA: Insufficient documentation

## 2021-06-28 MED ORDER — ANASTROZOLE 1 MG PO TABS
ORAL_TABLET | Freq: Every day | ORAL | 3 refills | Status: AC
  Filled 2021-06-28: qty 90, 90d supply, fill #0
  Filled 2021-10-04: qty 90, 90d supply, fill #1
  Filled 2021-12-19: qty 90, 90d supply, fill #2
  Filled 2022-03-30: qty 90, 90d supply, fill #3

## 2021-06-28 NOTE — Assessment & Plan Note (Signed)
07/01/2019:Right lumpectomy [facility]): IDC, 1.3cm, grade 2, clear margins, 2 right axillary lymph nodes negative.ER 100%, PR 90%, Ki-67 15%, HER-2 negative by FISH T1c N0 stage Ia Oncotype DX score 6: Distant recurrence at 9 years: 3% Adjuvant radiation 08/12/2019-09/15/2019  Treatment plan: Adjuvant antiestrogen therapywith anastrozole 1 mg daily x5 to 7 years  Anastrozole Toxicities: Tolerating it extremely well without any hot flashes or arthralgias or myalgias. She receives Prolia from her [place]. She exercises regularly and goes to the gym.  Breast Cancer Surveillance: 1. Breast Exam: 06/28/2021: Benign 2. Mammograms: 06/21/2021: Benign Breast Density Cat D  RTC in 1 year

## 2021-07-05 ENCOUNTER — Other Ambulatory Visit: Payer: Self-pay

## 2021-10-04 ENCOUNTER — Other Ambulatory Visit: Payer: Self-pay

## 2021-10-06 ENCOUNTER — Other Ambulatory Visit: Payer: Self-pay

## 2021-10-07 ENCOUNTER — Other Ambulatory Visit: Payer: Self-pay

## 2021-10-11 ENCOUNTER — Other Ambulatory Visit: Payer: Self-pay

## 2021-10-11 MED ORDER — VALACYCLOVIR HCL 500 MG PO TABS
ORAL_TABLET | ORAL | 2 refills | Status: DC
  Filled 2021-10-11: qty 30, 15d supply, fill #0
  Filled 2022-04-13: qty 30, 15d supply, fill #1

## 2021-10-12 ENCOUNTER — Telehealth: Payer: Self-pay | Admitting: Cardiology

## 2021-10-12 ENCOUNTER — Encounter: Payer: Self-pay | Admitting: Cardiology

## 2021-10-12 NOTE — Telephone Encounter (Signed)
Spoke with pt who complains of some intermittent lightheadedness this week.  She states this is new for her. She states it is not associated with position change.  She denies current CP, SOB or syncope.  She is not aware of any tachy episodes.  She states she exercises daily and is very active.  Current BP 100/72 with HR of 71.  She eats a healthy diet of mostly vegetables, fruit and eggs. She only drinks about 45 ounces of fluids daily.  She states she did have a problem with vertigo last year but this is not the same.   Recommended pt continue monitoring her HR and BP especially during episodes of lightheadedness.  Increase fluids, especially water to at least 64 ounces daily.  Appointment has been scheduled with Dr Marlou Porch for 11/22/2021.  Reviewed ED precautions. Pt verbalizes understanding and agrees with current plan.

## 2021-10-12 NOTE — Telephone Encounter (Signed)
Pt c/o Syncope: STAT if syncope occurred within 30 minutes and pt complains of lightheadedness High Priority if episode of passing out, completely, today or in last 24 hours   Did you pass out today? No   When is the last time you passed out? No   Has this occurred multiple times? Pt noticed this within the last week   Did you have any symptoms prior to passing out? No.  She has been working out

## 2021-10-24 ENCOUNTER — Ambulatory Visit
Admission: RE | Admit: 2021-10-24 | Discharge: 2021-10-24 | Disposition: A | Discharge status: Home / Self Care | Payer: Medicare Other | Source: Ambulatory Visit | Attending: Radiation Oncology | Admitting: Radiation Oncology

## 2021-10-24 VITALS — BP 103/69 | HR 66 | Temp 98.0°F | Resp 18 | Ht 66.0 in | Wt 129.7 lb

## 2021-10-24 DIAGNOSIS — Z79811 Long term (current) use of aromatase inhibitors: Secondary | ICD-10-CM | POA: Diagnosis not present

## 2021-10-24 DIAGNOSIS — C50411 Malignant neoplasm of upper-outer quadrant of right female breast: Secondary | ICD-10-CM | POA: Insufficient documentation

## 2021-10-24 DIAGNOSIS — Z170 Estrogen receptor positive status [ER+]: Secondary | ICD-10-CM | POA: Insufficient documentation

## 2021-10-24 DIAGNOSIS — Z923 Personal history of irradiation: Secondary | ICD-10-CM | POA: Insufficient documentation

## 2021-10-24 DIAGNOSIS — C50412 Malignant neoplasm of upper-outer quadrant of left female breast: Secondary | ICD-10-CM

## 2021-11-16 ENCOUNTER — Other Ambulatory Visit: Payer: Self-pay

## 2021-11-16 MED ORDER — TRIAMCINOLONE ACETONIDE 0.1 % EX CREA
TOPICAL_CREAM | CUTANEOUS | 0 refills | Status: AC
  Filled 2021-11-16: qty 80, 15d supply, fill #0

## 2021-11-22 ENCOUNTER — Ambulatory Visit (INDEPENDENT_AMBULATORY_CARE_PROVIDER_SITE_OTHER): Payer: Medicare Other | Admitting: Cardiology

## 2021-11-22 ENCOUNTER — Encounter: Payer: Self-pay | Admitting: Cardiology

## 2021-11-22 VITALS — BP 110/70 | HR 45 | Ht 66.0 in | Wt 126.0 lb

## 2021-11-22 DIAGNOSIS — I4510 Unspecified right bundle-branch block: Secondary | ICD-10-CM

## 2021-11-22 DIAGNOSIS — I471 Supraventricular tachycardia: Secondary | ICD-10-CM

## 2021-11-22 DIAGNOSIS — I444 Left anterior fascicular block: Secondary | ICD-10-CM

## 2021-11-22 NOTE — Progress Notes (Signed)
Cardiology Office Note:    Date:  11/22/2021   ID:  Carrie Serrano, DOB Aug 29, 1950, MRN [id number]  PCP:  [name], PA   [facility] Providers Cardiologist:  Candee Furbish, MD     Referring MD: [name], PA    History of Present Illness:    Carrie Serrano is a 71 y.o. female here for follow-up of PSVT/paroxysmal atrial tachycardia post ablation in 2014 by Dr. Lovena Le, [place].  She had reached out to [place] in mid June after feeling some intermittent lightheadedness.  Did not seem to be a positional change.  No chest pain or shortness of breath or syncope.  Not aware of any tachycardic episodes.  Her blood pressure was 100/72 with heart rate of 71.  She did have a problem with vertigo last year but this was not the same symptoms.  Ended up doing the [place] and it helped. She recorded her blood pressures and messaged me-108/68  On 04/17/11 her EKG strip looked AVNRT and a heart rate of 160-170. On another visit she brought me another strip of her going into her arrhythmia. I do not see the onset of the arrhythmia but when compared to prior strip, her heart rate is actually in the 140s. I do not see a clear P-wave preceding the QRS complexes. She does terminate with a PVC. The remaining beats are in sinus rhythm.  EPS/ablation by Dr. Crissie Sickles in 2014, questionable success in lab but clinically she has improved dramatically.  She had very difficult anatomy.  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   Has history of right upper quadrant breast cancer.  Lumpectomy.  Radiation.  Exercises vigorously.  Past Medical History:  Diagnosis Date   Breast cancer ([place]) 2021   Right breast   HSV-2 (herpes simplex virus 2) infection    Osteopenia    PAT (paroxysmal atrial tachycardia) ([facility])    Personal history of radiation therapy    SVT (supraventricular tachycardia) ([place])     Past  Surgical History:  Procedure Laterality Date   ABDOMINAL HYSTERECTOMY     ABLATION OF DYSRHYTHMIC FOCUS  06/13/2012   SVT    Dr  Lovena Le   BREAST LUMPECTOMY WITH RADIOACTIVE SEED AND SENTINEL LYMPH NODE BIOPSY Right 07/01/2019   Procedure: RIGHT BREAST LUMPECTOMY WITH RADIOACTIVE SEED AND SENTINEL LYMPH NODE BIOPSY;  Surgeon: Stark Klein, MD;  Location: [facility];  Service: General;  Laterality: Right;   COLONOSCOPY  2012   COLONOSCOPY WITH PROPOFOL N/A 06/26/2016   Procedure: COLONOSCOPY WITH PROPOFOL;  Surgeon: Lollie Sails, MD;  Location: [facility] ENDOSCOPY;  Service: Endoscopy;  Laterality: N/A;   ELECTROPHYSIOLOGY STUDY N/A 06/13/2012   Procedure: ELECTROPHYSIOLOGY STUDY;  Surgeon: Evans Lance, MD;  [facility] C[facility] LAB;  Service: Cardiovascular;  Laterality: N/A;   FLEXIBLE SIGMOIDOSCOPY N/A 01/24/2017   Procedure: FLEXIBLE SIGMOIDOSCOPY;  Surgeon: Toledo, Benay Pike, MD;  Location: [facility] ENDOSCOPY;  Service: Gastroenterology;  Laterality: N/A;   SUPRAVENTRICULAR TACHYCARDIA ABLATION N/A 06/13/2012   Procedure: SUPRAVENTRICULAR TACHYCARDIA ABLATION;  Surgeon: Evans Lance, MD;  Location: [facility] CATH LAB;  Service: Cardiovascular;  Laterality: N/A;   varicose vein injection in left lower leg      Current Medications: Current Meds  Medication Sig   anastrozole (ARIMIDEX) 1 MG tablet TAKE 1 TABLET (1 MG TOTAL) BY MOUTH DAILY.   Calcium Carbonate-Vit D-Min (CALCIUM 1200) 1200-1000 MG-UNIT CHEW  Chew 1 tablet by mouth daily.   COVID-19 mRNA bivalent vaccine, Pfizer, (PFIZER COVID-19 VAC BIVALENT) injection Inject into the muscle.   denosumab (PROLIA) 60 MG/ML SOLN injection Inject 60 mg into the skin every 6 (six) months. Administer in upper arm, thigh, or abdomen   triamcinolone cream (KENALOG) 0.1 % Apply affected area bid X 7-10 days prn   valACYclovir (VALTREX) 500 MG tablet Take 500 mg by mouth 2 (two) times daily as needed. For outbreak   valACYclovir (VALTREX) 500 MG  tablet Take 1 tablet by mouth two times daily for outbreaks   Vitamin D, Cholecalciferol, 400 units CAPS Take 800 Units by mouth.   Zoster Vaccine Adjuvanted [facility]) injection Inject 0.5 mLs into the muscle.     Allergies:   Latex   Social History   Socioeconomic History   Marital status: Single    Spouse name: Not on file   Number of children: Not on file   Years of education: Not on file   Highest education level: Not on file  Occupational History   Not on file  Tobacco Use   Smoking status: Former    Packs/day: 0.50    Years: 25.00    Total pack years: 12.50    Types: Cigarettes    Quit date: 05/01/2014    Years since quitting: 7.5   Smokeless tobacco: Never   Tobacco comments:    5 cigarettes a day.  Vaping Use   Vaping Use: Never used  Substance and Sexual Activity   Alcohol use: Yes    Alcohol/week: 8.0 - 10.0 standard drinks of alcohol    Types: 1 Cans of beer, 7 - 9 Standard drinks or equivalent per week    Comment: one beer a day   Drug use: No   Sexual activity: Not on file  Other Topics Concern   Not on file  Social History Narrative   Not on file   Social Determinants of Health   Financial Resource Strain: Not on file  Food Insecurity: Not on file  Transportation Needs: Not on file  Physical Activity: Not on file  Stress: Not on file  Social Connections: Not on file     Family History: The patient's family history includes Diabetes in her mother; Heart attack in her father; Hypertension in her father; Stroke in her mother.  ROS:   Please see the history of present illness.     All other systems reviewed and are negative.  EKGs/Labs/Other Studies Reviewed:    The following studies were reviewed today: Echo 2012 EF normal.  EKG:  EKG is  ordered today.  The ekg ordered today demonstrates sinus bradycardia 45 right bundle branch block left anterior fascicular block  Recent Labs: No results found for requested labs within last 365 days.   Recent Lipid Panel No results found for: "CHOL", "TRIG", "HDL", "CHOLHDL", "VLDL", "LDLCALC", "LDLDIRECT"  LDL 102, was HDL 92 triglycerides 94 creatinine 0.85 potassium 4.8 ALT 26.  Outside labs.  Reviewed  Risk Assessment/Calculations:              Physical Exam:    VS:  BP 110/70 (BP Location: Left Arm, Patient Position: Sitting, Cuff Size: Normal)   Pulse (!) 45   Ht '5\' 6"'$  (1.676 m)   Wt 126 lb (57.2 kg)   SpO2 96%   BMI 20.34 kg/m     Wt Readings from Last 3 Encounters:  11/22/21 126 lb (57.2 kg)  10/24/21 129 lb 11.2 oz (58.8  kg)  06/28/21 132 lb 4.8 oz (60 kg)     GEN:  Well nourished, well developed in no acute distress HEENT: Normal NECK: No JVD; No carotid bruits LYMPHATICS: No lymphadenopathy CARDIAC: Loletha Grayer reg no murmurs, no rubs, gallops RESPIRATORY:  Clear to auscultation without rales, wheezing or rhonchi  ABDOMEN: Soft, non-tender, non-distended MUSCULOSKELETAL:  No edema; No deformity  SKIN: Warm and dry NEUROLOGIC:  Alert and oriented x 3 PSYCHIATRIC:  Normal affect   ASSESSMENT:    1. PSVT (paroxysmal supraventricular tachycardia) ([place])   2. Right bundle branch block   3. LAFB (left anterior fascicular block)    PLAN:    In order of problems listed above:    PSVT/PAT -Doing well post ablation 2014.  No further symptoms.  She is off of the low-dose Toprol at night.  No longer taking any beta-blocker.  Denies any problems with heavy exercise.  Overall no significant change since prior visit.  Excellent.  Prior office notes reviewed, EP note reviewed.  Procedure reviewed.   Right bundle branch block/left anterior fascicular block/bifascicular block - Not having any higher symptoms such as syncope.  We will continue to monitor.   Prior tobacco cessation for close to 10 years.  Excellent.  Family history of CAD - Father and around her age had heart issues, valve replacement. - We will check a coronary calcium score $99 test.  This will also  look at her lung fields given her prior smoking history.  If calcified plaque is present, intensify medication management.  She does very well with diet and exercise.   Right upper quadrant breast cancer - Has previously undergone chemotherapy and radiation as well as lumpectomy.  Lightheadedness - As reviewed above.  Seem to have been exacerbated by vertigo.  Physical therapy maneuvers helped.  No changes made.  Continue with hydration as well.        Medication Adjustments/Labs and Tests Ordered: Current medicines are reviewed at length with the patient today.  Concerns regarding medicines are outlined above.  Orders Placed This Encounter  Procedures   CT CARDIAC SCORING (SELF PAY ONLY)   EKG 12-Lead   No orders of the defined types were placed in this encounter.   Patient Instructions  Medication Instructions:  Your physician recommends that you continue on your current medications as directed. Please refer to the Current Medication list given to you today.  *If you need a refill on your cardiac medications before your next appointment, please call your pharmacy*  Testing/Procedures: Your physician has requested that you have a calcium score CT scan. There is a $99 fee for the scan.   Follow-Up: At [facility], you and your health needs are our priority.  As part of our continuing mission to provide you with exceptional heart care, we have created designated Provider Care Teams.  These Care Teams include your primary Cardiologist (physician) and Advanced Practice Providers (APPs -  Physician Assistants and Nurse Practitioners) who all work together to provide you with the care you need, when you need it.   Your next appointment:   1 year(s)  The format for your next appointment:   In Person  Provider:   Candee Furbish, MD     Important Information About Sugar         Signed, Candee Furbish, MD  11/22/2021 9:45 AM    [place]

## 2021-11-22 NOTE — Patient Instructions (Signed)
Medication Instructions:  Your physician recommends that you continue on your current medications as directed. Please refer to the Current Medication list given to you today.  *If you need a refill on your cardiac medications before your next appointment, please call your pharmacy*  Testing/Procedures: Your physician has requested that you have a calcium score CT scan. There is a $99 fee for the scan.   Follow-Up: At [facility], you and your health needs are our priority.  As part of our continuing mission to provide you with exceptional heart care, we have created designated Provider Care Teams.  These Care Teams include your primary Cardiologist (physician) and Advanced Practice Providers (APPs -  Physician Assistants and Nurse Practitioners) who all work together to provide you with the care you need, when you need it.   Your next appointment:   1 year(s)  The format for your next appointment:   In Person  Provider:   Candee Furbish, MD     Important Information About Sugar

## 2021-12-05 ENCOUNTER — Other Ambulatory Visit: Payer: Self-pay

## 2021-12-05 MED ORDER — NA SULFATE-K SULFATE-MG SULF 17.5-3.13-1.6 GM/177ML PO SOLN
ORAL | 0 refills | Status: DC
  Filled 2021-12-05: qty 354, 1d supply, fill #0

## 2021-12-08 ENCOUNTER — Other Ambulatory Visit: Payer: Self-pay

## 2021-12-19 ENCOUNTER — Other Ambulatory Visit: Payer: Self-pay

## 2022-01-11 ENCOUNTER — Inpatient Hospital Stay: Admission: RE | Admit: 2022-01-11 | Payer: Medicare Other | Source: Ambulatory Visit

## 2022-01-19 ENCOUNTER — Other Ambulatory Visit: Payer: Self-pay

## 2022-01-19 ENCOUNTER — Ambulatory Visit
Admission: RE | Admit: 2022-01-19 | Discharge: 2022-01-19 | Disposition: A | Discharge status: Home / Self Care | Payer: Medicare Other | Source: Ambulatory Visit | Attending: Cardiology | Admitting: Cardiology

## 2022-01-19 ENCOUNTER — Other Ambulatory Visit (HOSPITAL_BASED_OUTPATIENT_CLINIC_OR_DEPARTMENT_OTHER): Payer: Medicare Other

## 2022-01-19 DIAGNOSIS — I471 Supraventricular tachycardia: Secondary | ICD-10-CM | POA: Insufficient documentation

## 2022-01-19 DIAGNOSIS — I444 Left anterior fascicular block: Secondary | ICD-10-CM | POA: Insufficient documentation

## 2022-01-19 DIAGNOSIS — I4510 Unspecified right bundle-branch block: Secondary | ICD-10-CM | POA: Insufficient documentation

## 2022-01-19 MED ORDER — FLUAD QUADRIVALENT 0.5 ML IM PRSY
PREFILLED_SYRINGE | INTRAMUSCULAR | 0 refills | Status: DC
  Filled 2022-01-19: qty 0.5, 1d supply, fill #0

## 2022-01-24 ENCOUNTER — Telehealth: Payer: Self-pay | Admitting: Cardiology

## 2022-01-24 NOTE — Telephone Encounter (Signed)
Jerline Pain, MD  01/20/2022  6:41 AM EDT     Excellent cardiac scan. No calcified coronary plaque, score of 0.    I will forward the results to Dr. Lindi Adie as well to comment on radiologists findings.       Reviewed results of coronary CT with pt who states understanding.  Advised test results have been sent to Dr Lindi Adie for his review and any further necessary testing.   Pt states understanding.  I did encourage her to f/u with Dr Geralyn Flash office to verify if she needs any sooner f/u with him.

## 2022-01-24 NOTE — Telephone Encounter (Signed)
Pt calling stating she read Dr. Marlou Porch note on her CT cardiac scoring but she'd like a nurse to call to explain some things

## 2022-03-10 ENCOUNTER — Ambulatory Visit: Payer: Medicare Other | Admitting: Anesthesiology

## 2022-03-10 ENCOUNTER — Encounter
Admission: RE | Disposition: A | Discharge status: Home / Self Care | Payer: Self-pay | Source: Home / Self Care | Attending: Gastroenterology

## 2022-03-10 ENCOUNTER — Ambulatory Visit
Admission: RE | Admit: 2022-03-10 | Discharge: 2022-03-10 | Disposition: A | Discharge status: Home / Self Care | Payer: Medicare Other | Attending: Gastroenterology | Admitting: Gastroenterology

## 2022-03-10 DIAGNOSIS — Z853 Personal history of malignant neoplasm of breast: Secondary | ICD-10-CM | POA: Insufficient documentation

## 2022-03-10 DIAGNOSIS — Z87891 Personal history of nicotine dependence: Secondary | ICD-10-CM | POA: Insufficient documentation

## 2022-03-10 DIAGNOSIS — Z86010 Personal history of colonic polyps: Secondary | ICD-10-CM | POA: Diagnosis not present

## 2022-03-10 DIAGNOSIS — K640 First degree hemorrhoids: Secondary | ICD-10-CM | POA: Insufficient documentation

## 2022-03-10 DIAGNOSIS — B009 Herpesviral infection, unspecified: Secondary | ICD-10-CM | POA: Insufficient documentation

## 2022-03-10 DIAGNOSIS — Z1211 Encounter for screening for malignant neoplasm of colon: Secondary | ICD-10-CM | POA: Diagnosis present

## 2022-03-10 DIAGNOSIS — Z923 Personal history of irradiation: Secondary | ICD-10-CM | POA: Diagnosis not present

## 2022-03-10 HISTORY — PX: COLONOSCOPY WITH PROPOFOL: SHX5780

## 2022-03-10 SURGERY — COLONOSCOPY WITH PROPOFOL
Anesthesia: General

## 2022-03-10 MED ORDER — PROPOFOL 500 MG/50ML IV EMUL
INTRAVENOUS | Status: DC | PRN
  Administered 2022-03-10: 120 ug/kg/min via INTRAVENOUS

## 2022-03-10 MED ORDER — PROPOFOL 10 MG/ML IV BOLUS
INTRAVENOUS | Status: DC | PRN
  Administered 2022-03-10: 100 mg via INTRAVENOUS

## 2022-03-10 MED ORDER — SODIUM CHLORIDE 0.9 % IV SOLN
INTRAVENOUS | Status: DC
  Administered 2022-03-10: 20 mL/h via INTRAVENOUS

## 2022-03-10 NOTE — H&P (Signed)
Outpatient short stay form Pre-procedure 03/10/2022  Lesly Rubenstein, MD  Primary Physician: [name], PA  Reason for visit:  Surveillance colonoscopy  History of present illness:    71 y/o lady with history of SVT here for surveillance colonoscopy. Last colonoscopy in 2018 with small TA. No family history of GI malignancies. No blood thinners. No significant abdominal surgeries.    Current Facility-Administered Medications:    0.9 %  sodium chloride infusion, , Intravenous, Continuous, Bedelia Pong, Hilton Cork, MD, Last Rate: 20 mL/hr at 03/10/22 1053, 20 mL/hr at 03/10/22 1053  Medications Prior to Admission  Medication Sig Dispense Refill Last Dose   anastrozole (ARIMIDEX) 1 MG tablet TAKE 1 TABLET (1 MG TOTAL) BY MOUTH DAILY. 90 tablet 3 Past Week   Calcium Carbonate-Vit D-Min (CALCIUM 1200) 1200-1000 MG-UNIT CHEW Chew 1 tablet by mouth daily.   Past Week   COVID-19 mRNA bivalent vaccine, Pfizer, (PFIZER COVID-19 VAC BIVALENT) injection Inject into the muscle. 0.3 mL 0 Past Week   denosumab (PROLIA) 60 MG/ML SOLN injection Inject 60 mg into the skin every 6 (six) months. Administer in upper arm, thigh, or abdomen   Past Week   Na Sulfate-K Sulfate-Mg Sulf 17.5-3.13-1.6 GM/177ML SOLN Take 2 Bottles (1 kit total) by mouth as directed One kit contains 2 bottles.  Take both bottles at the times instructed by your provider. 354 mL 0 Past Week   triamcinolone cream (KENALOG) 0.1 % Apply affected area bid X 7-10 days prn 80 g 0 Past Week   valACYclovir (VALTREX) 500 MG tablet Take 500 mg by mouth 2 (two) times daily as needed. For outbreak   Past Week   valACYclovir (VALTREX) 500 MG tablet Take 1 tablet by mouth two times daily for outbreaks 30 tablet 2 Past Week   Vitamin D, Cholecalciferol, 400 units CAPS Take 800 Units by mouth.   Past Week   Zoster Vaccine Adjuvanted [facility]) injection Inject 0.5 mLs into the muscle. 1 each 1 Past Month     Allergies  Allergen Reactions    Latex Itching     Past Medical History:  Diagnosis Date   Breast cancer ([place]) 2021   Right breast   HSV-2 (herpes simplex virus 2) infection    Osteopenia    PAT (paroxysmal atrial tachycardia) ([facility])    Personal history of radiation therapy    SVT (supraventricular tachycardia) ([place])     Review of systems:  Otherwise negative.    Physical Exam  Gen: Alert, oriented. Appears stated age.  HEENT: PERRLA. Lungs: No respiratory distress CV: RRR Abd: soft, benign, no masses Ext: No edema    Planned procedures: Proceed with colonoscopy. The patient understands the nature of the planned procedure, indications, risks, alternatives and potential complications including but not limited to bleeding, infection, perforation, damage to internal organs and possible oversedation/side effects from anesthesia. The patient agrees and gives consent to proceed.  Please refer to procedure notes for findings, recommendations and patient disposition/instructions.     Lesly Rubenstein, MD [facility]

## 2022-03-10 NOTE — Op Note (Signed)
[facility] Gastroenterology Patient Name: Carrie Serrano Procedure Date: 03/10/2022 11:34 AM MRN: [id number] Account #: [id number] Date of Birth: 06-04-1950 Admit Type: Outpatient Age: 71 Room: [facility] ENDO ROOM 3 Gender: Female Note Status: Finalized Instrument Name: Peds Colonoscope [id number] Procedure:             Colonoscopy Indications:           Surveillance: Personal history of adenomatous polyps                         on last colonoscopy 5 years ago Providers:             Andrey Farmer MD, MD Referring MD:          [name] (Referring MD) Medicines:             Monitored Anesthesia Care Complications:         No immediate complications. Procedure:             Pre-Anesthesia Assessment:                        - Prior to the procedure, a History and Physical was                         performed, and patient medications and allergies were                         reviewed. The patient is competent. The risks and                         benefits of the procedure and the sedation options and                         risks were discussed with the patient. All questions                         were answered and informed consent was obtained.                         Patient identification and proposed procedure were                         verified by the physician, the nurse, the                         anesthesiologist, the anesthetist and the technician                         in the endoscopy suite. Mental Status Examination:                         alert and oriented. Airway Examination: normal                         oropharyngeal airway and neck mobility. Respiratory                         Examination: clear to auscultation. CV Examination:  normal. Prophylactic Antibiotics: The patient does not                         require prophylactic antibiotics. Prior                         Anticoagulants: The patient has taken no  anticoagulant                         or antiplatelet agents. ASA Grade Assessment: II - A                         patient with mild systemic disease. After reviewing                         the risks and benefits, the patient was deemed in                         satisfactory condition to undergo the procedure. The                         anesthesia plan was to use monitored anesthesia care                         (MAC). Immediately prior to administration of                         medications, the patient was re-assessed for adequacy                         to receive sedatives. The heart rate, respiratory                         rate, oxygen saturations, blood pressure, adequacy of                         pulmonary ventilation, and response to care were                         monitored throughout the procedure. The physical                         status of the patient was re-assessed after the                         procedure.                        After obtaining informed consent, the colonoscope was                         passed under direct vision. Throughout the procedure,                         the patient's blood pressure, pulse, and oxygen                         saturations were monitored continuously. The  Colonoscope was introduced through the anus and                         advanced to the the cecum, identified by appendiceal                         orifice and ileocecal valve. The colonoscopy was                         performed without difficulty. The patient tolerated                         the procedure well. The quality of the bowel                         preparation was good. The ileocecal valve, appendiceal                         orifice, and rectum were photographed. Findings:      The perianal and digital rectal examinations were normal.      Internal hemorrhoids were found during retroflexion. The hemorrhoids       were Grade I  (internal hemorrhoids that do not prolapse).      The exam was otherwise without abnormality on direct and retroflexion       views. Impression:            - Internal hemorrhoids.                        - The examination was otherwise normal on direct and                         retroflexion views.                        - No specimens collected. Recommendation:        - Discharge patient to home.                        - Resume previous diet.                        - Continue present medications.                        - Repeat colonoscopy in 10 years for surveillance.                        - Return to referring physician as previously                         scheduled. Procedure Code(s):     --- Professional ---                        J4970, Colorectal cancer screening; colonoscopy on                         individual at high risk Diagnosis Code(s):     --- Professional ---  Z86.010, Personal history of colonic polyps                        K64.0, First degree hemorrhoids CPT copyright 2022 American Medical Association. All rights reserved. The codes documented in this report are preliminary and upon coder review may  be revised to meet current compliance requirements. Andrey Farmer MD, MD 03/10/2022 12:25:08 PM Number of Addenda: 0 Note Initiated On: 03/10/2022 11:34 AM Scope Withdrawal Time: 0 hours 10 minutes 17 seconds  Total Procedure Duration: 0 hours 16 minutes 32 seconds  Estimated Blood Loss:  Estimated blood loss: none.      [facility]

## 2022-03-10 NOTE — Anesthesia Preprocedure Evaluation (Signed)
Anesthesia Evaluation  Patient identified by MRN, date of birth, ID band Patient awake    Reviewed: Allergy & Precautions, NPO status , Patient's Chart, lab work & pertinent test results  Airway Mallampati: II  TM Distance: >3 FB Neck ROM: full    Dental  (+) Teeth Intact   Pulmonary neg pulmonary ROS, former smoker   Pulmonary exam normal        Cardiovascular + dysrhythmias (s/p ablation) Supra Ventricular Tachycardia      Neuro/Psych negative neurological ROS  negative psych ROS   GI/Hepatic negative GI ROS, Neg liver ROS,,,  Endo/Other  negative endocrine ROS    Renal/GU negative Renal ROS  negative genitourinary   Musculoskeletal   Abdominal   Peds  Hematology negative hematology ROS (+)   Anesthesia Other Findings Past Medical History: 2021: Breast cancer ([place])     Comment:  Right breast No date: HSV-2 (herpes simplex virus 2) infection No date: Osteopenia No date: PAT (paroxysmal atrial tachycardia) (HCC) No date: Personal history of radiation therapy No date: SVT (supraventricular tachycardia) ([place])  Past Surgical History: No date: ABDOMINAL HYSTERECTOMY 06/13/2012: ABLATION OF DYSRHYTHMIC FOCUS     Comment:  SVT    Dr  Lovena Le 07/01/2019: BREAST LUMPECTOMY WITH RADIOACTIVE SEED AND SENTINEL LYMPH  NODE BIOPSY; Right     Comment:  Procedure: RIGHT BREAST LUMPECTOMY WITH RADIOACTIVE SEED              AND SENTINEL LYMPH NODE BIOPSY;  Surgeon: Stark Klein,               MD;  Location: [place];  Service:               General;  Laterality: Right; 2012: COLONOSCOPY 06/26/2016: COLONOSCOPY WITH PROPOFOL; N/A     Comment:  Procedure: COLONOSCOPY WITH PROPOFOL;  Surgeon: Lollie Sails, MD;  Location: [facility] ENDOSCOPY;  Service:               Endoscopy;  Laterality: N/A; 06/13/2012: ELECTROPHYSIOLOGY STUDY; N/A     Comment:  Procedure: ELECTROPHYSIOLOGY STUDY;  Surgeon: Evans Lance, MD;  Location: [facility] CATH LAB;  Service:               Cardiovascular;  Laterality: N/A; 01/24/2017: FLEXIBLE SIGMOIDOSCOPY; N/A     Comment:  Procedure: FLEXIBLE SIGMOIDOSCOPY;  Surgeon: [place],               Benay Pike, MD;  Location: [facility] ENDOSCOPY;  Service:               Gastroenterology;  Laterality: N/A; 06/13/2012: SUPRAVENTRICULAR TACHYCARDIA ABLATION; N/A     Comment:  Procedure: SUPRAVENTRICULAR TACHYCARDIA ABLATION;                Surgeon: Evans Lance, MD;  Location: [facility] CATH LAB;                Service: Cardiovascular;  Laterality: N/A; No date: varicose vein injection in left lower leg  BMI    Body Mass Index: 19.37 kg/m      Reproductive/Obstetrics negative OB ROS                              Anesthesia Physical Anesthesia Plan  ASA: 2  Anesthesia Plan: General   Post-op Pain Management: Minimal or no pain anticipated   Induction: Intravenous  PONV Risk Score and Plan: Propofol infusion and TIVA  Airway Management Planned: Natural Airway and Nasal Cannula  Additional Equipment:   Intra-op Plan:   Post-operative Plan:   Informed Consent: I have reviewed the patients History and Physical, chart, labs and discussed the procedure including the risks, benefits and alternatives for the proposed anesthesia with the patient or authorized representative who has indicated his/her understanding and acceptance.     Dental Advisory Given  Plan Discussed with: Anesthesiologist, CRNA and Surgeon  Anesthesia Plan Comments: (Patient consented for risks of anesthesia including but not limited to:  - adverse reactions to medications - risk of airway placement if required - damage to eyes, teeth, lips or other oral mucosa - nerve damage due to positioning  - sore throat or hoarseness - Damage to heart, brain, nerves, lungs, other parts of body or loss of life  Patient voiced understanding.)         Anesthesia  Quick Evaluation

## 2022-03-10 NOTE — Anesthesia Postprocedure Evaluation (Signed)
Anesthesia Post Note  Patient: Carrie Serrano  Procedure(s) Performed: COLONOSCOPY WITH PROPOFOL  Patient location during evaluation: Endoscopy Anesthesia Type: General Level of consciousness: awake and alert Pain management: pain level controlled Vital Signs Assessment: post-procedure vital signs reviewed and stable Respiratory status: spontaneous breathing, nonlabored ventilation, respiratory function stable and patient connected to nasal cannula oxygen Cardiovascular status: blood pressure returned to baseline and stable Postop Assessment: no apparent nausea or vomiting Anesthetic complications: no   No notable events documented.   Last Vitals:  Vitals:   03/10/22 1039 03/10/22 1202  BP: 126/72 105/66  Pulse: 74 66  Resp: 20 19  Temp: (!) 35.6 C (!) 36.2 C  SpO2: 100% 100%    Last Pain:  Vitals:   03/10/22 1202  TempSrc: Temporal  PainSc: 0-No pain                 Ilene Qua

## 2022-03-10 NOTE — Transfer of Care (Signed)
Immediate Anesthesia Transfer of Care Note  Patient: Carrie Serrano  Procedure(s) Performed: COLONOSCOPY WITH PROPOFOL  Patient Location: PACU and Endoscopy Unit  Anesthesia Type:General  Level of Consciousness: drowsy and patient cooperative  Airway & Oxygen Therapy: Patient Spontanous Breathing  Post-op Assessment: Report given to RN and Post -op Vital signs reviewed and stable  Post vital signs: Reviewed and stable  Last Vitals:  Vitals Value Taken Time  BP 105/66 03/10/22 1203  Temp 36.2 C 03/10/22 1202  Pulse 66 03/10/22 1204  Resp 26 03/10/22 1204  SpO2 100 % 03/10/22 1204  Vitals shown include unvalidated device data.  Last Pain:  Vitals:   03/10/22 1202  TempSrc: Temporal  PainSc: 0-No pain         Complications: No notable events documented.

## 2022-03-10 NOTE — Interval H&P Note (Signed)
History and Physical Interval Note:  03/10/2022 11:29 AM  Carrie Serrano  has presented today for surgery, with the diagnosis of personal history adenomatous polyp.  The various methods of treatment have been discussed with the patient and family. After consideration of risks, benefits and other options for treatment, the patient has consented to  Procedure(s): COLONOSCOPY WITH PROPOFOL (N/A) as a surgical intervention.  The patient's history has been reviewed, patient examined, no change in status, stable for surgery.  I have reviewed the patient's chart and labs.  Questions were answered to the patient's satisfaction.     Lesly Rubenstein  Ok to proceed with colonoscopy

## 2022-03-13 ENCOUNTER — Encounter: Payer: Self-pay | Admitting: Gastroenterology

## 2022-03-15 ENCOUNTER — Other Ambulatory Visit: Payer: Self-pay

## 2022-03-15 MED ORDER — TRIAMCINOLONE ACETONIDE 55 MCG/ACT NA AERO: INHALATION_SPRAY | NASAL | 11 refills | Status: AC

## 2022-03-16 ENCOUNTER — Other Ambulatory Visit: Payer: Self-pay

## 2022-03-30 ENCOUNTER — Other Ambulatory Visit: Payer: Self-pay

## 2022-04-14 ENCOUNTER — Other Ambulatory Visit: Payer: Self-pay

## 2022-05-16 ENCOUNTER — Other Ambulatory Visit: Payer: Self-pay | Admitting: Hematology and Oncology

## 2022-05-16 DIAGNOSIS — Z1231 Encounter for screening mammogram for malignant neoplasm of breast: Secondary | ICD-10-CM

## 2022-06-23 ENCOUNTER — Ambulatory Visit
Admission: RE | Admit: 2022-06-23 | Discharge: 2022-06-23 | Disposition: A | Discharge status: Home / Self Care | Payer: Medicare Other | Source: Ambulatory Visit | Attending: Hematology and Oncology | Admitting: Hematology and Oncology

## 2022-06-23 DIAGNOSIS — Z1231 Encounter for screening mammogram for malignant neoplasm of breast: Secondary | ICD-10-CM | POA: Diagnosis present

## 2022-06-26 NOTE — Progress Notes (Signed)
Patient Care Team: [name], [place] as PCP - General (Nurse Practitioner) Jerline Pain, MD as PCP - Cardiology (Cardiology) Stark Klein, MD as Consulting Physician (General Surgery) Nicholas Lose, MD as Consulting Physician (Hematology and Oncology) Gery Pray, MD as Consulting Physician (Radiation Oncology) Noreene Filbert, MD as Radiation Oncologist (Radiation Oncology)  DIAGNOSIS: No diagnosis found.  SUMMARY OF ONCOLOGIC HISTORY: Oncology History  Malignant neoplasm of upper-outer quadrant of right breast in female, estrogen receptor positive ([employer])  06/13/2019 Initial Diagnosis   Screening mammogram detected a right breast mass, palpable on exam. Diagnostic mammogram showed a 1.0cm mass in the right breast at the 10:30 position. Biopsy showed invasive mammary carcinoma, grade 1, HER-2 equivocal by IHC, negative by FISH, ER+ 100%, PR+ 90%, Ki67 15%   06/18/2019 Cancer Staging   Staging form: Breast, AJCC 8th Edition - Clinical stage from 06/18/2019: cT1b, cN0, cM0, G2, ER+, PR+, HER2: Unknown   07/01/2019 Surgery   Right lumpectomy [facility]) ([id number]): IDC, 1.3cm, grade 2, clear margins, 2 right axillary lymph nodes negative.   07/01/2019 Cancer Staging   Staging form: Breast, AJCC 8th Edition - Pathologic stage from 07/01/2019: Stage IA (pT1c, pN0, cM0, G2, ER+, PR+, HER2-)   07/16/2019 Oncotype testing   The Oncotype DX score was 6 predicting a risk of outside the breast recurrence over the next 9 years of 3% if the patient's only systemic therapy is Anastrozole for 5 years.    08/13/2019 - 09/15/2019 Radiation Therapy   The patient initially received a dose of 42.56 Gy in 16 fractions to the breast using whole-breast tangent fields. This was delivered using a 3-D conformal technique. The pt received a boost delivering an additional 16 Gy in 8 fractions using a electron boost with 41mV electrons. The total dose was 58.56 Gy.   09/2019 - 09/2024 Anti-estrogen oral therapy    Anastrozole     CHIEF COMPLIANT:   INTERVAL HISTORY: Carrie Serrano a   ALLERGIES:  is allergic to latex.  MEDICATIONS:  Current Outpatient Medications  Medication Sig Dispense Refill   anastrozole (ARIMIDEX) 1 MG tablet TAKE 1 TABLET (1 MG TOTAL) BY MOUTH DAILY. 90 tablet 3   Calcium Carbonate-Vit D-Min (CALCIUM 1200) 1200-1000 MG-UNIT CHEW Chew 1 tablet by mouth daily.     COVID-19 mRNA bivalent vaccine, Pfizer, (PFIZER COVID-19 VAC BIVALENT) injection Inject into the muscle. 0.3 mL 0   denosumab (PROLIA) 60 MG/ML SOLN injection Inject 60 mg into the skin every 6 (six) months. Administer in upper arm, thigh, or abdomen     Na Sulfate-K Sulfate-Mg Sulf 17.5-3.13-1.6 GM/177ML SOLN Take 2 Bottles (1 kit total) by mouth as directed One kit contains 2 bottles.  Take both bottles at the times instructed by your provider. 354 mL 0   triamcinolone (NASACORT) 55 MCG/ACT AERO nasal inhaler Use 2 sprays in each nostril twice a day 16.9 mL 11   triamcinolone cream (KENALOG) 0.1 % Apply affected area bid X 7-10 days prn 80 g 0   valACYclovir (VALTREX) 500 MG tablet Take 500 mg by mouth 2 (two) times daily as needed. For outbreak     valACYclovir (VALTREX) 500 MG tablet Take 1 tablet by mouth two times daily for outbreaks 30 tablet 2   Vitamin D, Cholecalciferol, 400 units CAPS Take 800 Units by mouth.     Zoster Vaccine Adjuvanted ([facility] injection Inject 0.5 mLs into the muscle. 1 each 1   No current facility-administered medications for this visit.  PHYSICAL EXAMINATION: ECOG PERFORMANCE STATUS: {CHL ONC ECOG PS:[phone]}  There were no vitals filed for this visit. There were no vitals filed for this visit.  BREAST:*** No palpable masses or nodules in either right or left breasts. No palpable axillary supraclavicular or infraclavicular adenopathy no breast tenderness or nipple discharge. (exam performed in the presence of a chaperone)  LABORATORY DATA:  I have reviewed the  data as listed    Latest Ref Rng & Units 06/18/2019    8:41 AM 06/04/2012    3:59 PM  CMP  Glucose 70 - 99 mg/dL 96  71   BUN 8 - 23 mg/dL 15  17   Creatinine 0.44 - 1.00 mg/dL 0.85  0.9   Sodium 135 - 145 mmol/L 141  140   Potassium 3.5 - 5.1 mmol/L 4.8  4.6   Chloride 98 - 111 mmol/L 107  105   CO2 22 - 32 mmol/L 26  28   Calcium 8.9 - 10.3 mg/dL 9.5  9.5   Total Protein 6.5 - 8.1 g/dL 6.8    Total Bilirubin 0.3 - 1.2 mg/dL 0.6    Alkaline Phos 38 - 126 U/L 75    AST 15 - 41 U/L 28    ALT 0 - 44 U/L 26      Lab Results  Component Value Date   WBC 5.2 09/10/2019   HGB 14.5 09/10/2019   HCT 42.8 09/10/2019   MCV 97.7 09/10/2019   PLT 206 09/10/2019   NEUTROABS 3.2 06/18/2019    ASSESSMENT & PLAN:  No problem-specific Assessment & Plan notes found for this encounter.    No orders of the defined types were placed in this encounter.  The patient has a good understanding of the overall plan. she agrees with it. she will call with any problems that may develop before the next visit here. Total time spent: 30 mins including face to face time and time spent for planning, charting and co-ordination of care   Suzzette Righter, [place] 06/26/22    I Gardiner Coins am acting as a Education administrator for [employer]  ***

## 2022-06-28 ENCOUNTER — Inpatient Hospital Stay: Payer: Medicare Other | Attending: Hematology and Oncology | Admitting: Hematology and Oncology

## 2022-06-28 ENCOUNTER — Other Ambulatory Visit: Payer: Self-pay

## 2022-06-28 VITALS — BP 113/77 | HR 65 | Temp 97.8°F | Resp 18 | Ht 66.0 in | Wt 123.9 lb

## 2022-06-28 DIAGNOSIS — Z170 Estrogen receptor positive status [ER+]: Secondary | ICD-10-CM

## 2022-06-28 DIAGNOSIS — Z79811 Long term (current) use of aromatase inhibitors: Secondary | ICD-10-CM | POA: Diagnosis not present

## 2022-06-28 DIAGNOSIS — C50411 Malignant neoplasm of upper-outer quadrant of right female breast: Secondary | ICD-10-CM | POA: Insufficient documentation

## 2022-06-28 NOTE — Assessment & Plan Note (Addendum)
07/01/2019: Right lumpectomy [facility]): IDC, 1.3cm, grade 2, clear margins, 2 right axillary lymph nodes negative.  ER 100%, PR 90%, Ki-67 15%, HER-2 negative by FISH T1c N0 stage Ia Oncotype DX score 6: Distant recurrence at 9 years: 3% Adjuvant radiation 08/12/2019-09/15/2019   Treatment plan: Adjuvant antiestrogen therapy with anastrozole 1 mg daily x5 to 7 years started June 2021   Anastrozole Toxicities: Tolerating it extremely well without any hot flashes or arthralgias or myalgias. She receives Prolia from her [place]. She exercises regularly and goes to the gym.   Breast Cancer Surveillance: 1. Breast Exam: 06/28/2022: Benign 2. Mammograms: 06/27/2022: Benign Breast Density Cat D Because of such high breast density I recommended that we obtain a breast MRI every 2 to 3 years.   RTC in 1 year

## 2022-06-29 ENCOUNTER — Ambulatory Visit: Payer: Medicare Other | Admitting: Hematology and Oncology

## 2022-07-10 ENCOUNTER — Other Ambulatory Visit: Payer: Self-pay

## 2022-07-13 ENCOUNTER — Other Ambulatory Visit: Payer: Self-pay | Admitting: Hematology and Oncology

## 2022-07-13 ENCOUNTER — Other Ambulatory Visit: Payer: Self-pay

## 2022-07-13 MED FILL — Anastrozole Tab 1 MG: ORAL | 90 days supply | Qty: 90 | Fill #0 | Status: AC

## 2022-07-21 ENCOUNTER — Ambulatory Visit
Admission: RE | Admit: 2022-07-21 | Discharge: 2022-07-21 | Disposition: A | Discharge status: Home / Self Care | Payer: Medicare Other | Source: Ambulatory Visit | Attending: Hematology and Oncology | Admitting: Hematology and Oncology

## 2022-07-21 DIAGNOSIS — C50411 Malignant neoplasm of upper-outer quadrant of right female breast: Secondary | ICD-10-CM

## 2022-07-21 MED ORDER — GADOPICLENOL 0.5 MMOL/ML IV SOLN
6.0000 mL | Freq: Once | INTRAVENOUS | Status: AC | PRN
  Administered 2022-07-21: 6 mL via INTRAVENOUS

## 2022-07-24 ENCOUNTER — Other Ambulatory Visit: Payer: Self-pay | Admitting: *Deleted

## 2022-07-24 ENCOUNTER — Telehealth: Payer: Self-pay | Admitting: *Deleted

## 2022-07-24 DIAGNOSIS — Z170 Estrogen receptor positive status [ER+]: Secondary | ICD-10-CM

## 2022-07-24 NOTE — Telephone Encounter (Signed)
-----   Message from Gardenia Phlegm, NP sent at 07/24/2022  1:03 PM EDT ----- Please let patient know MRI is negative, recommend repeat MRI in 1 year, mammo in 6 months.  Thanks, [place] ----- Message ----- From: Interface, Rad Results In Sent: 07/24/2022  12:09 PM EDT To: Nicholas Lose, MD

## 2022-07-24 NOTE — Telephone Encounter (Signed)
RN placed call to pt to relay below information per NP.  Pt notified and verbalized understanding.

## 2022-10-09 MED FILL — Anastrozole Tab 1 MG: ORAL | 90 days supply | Qty: 90 | Fill #1 | Status: AC

## 2022-10-26 ENCOUNTER — Encounter: Payer: Self-pay | Admitting: Radiation Oncology

## 2022-10-26 ENCOUNTER — Ambulatory Visit
Admission: RE | Admit: 2022-10-26 | Discharge: 2022-10-26 | Disposition: A | Discharge status: Home / Self Care | Payer: Medicare Other | Source: Ambulatory Visit | Attending: Radiation Oncology | Admitting: Radiation Oncology

## 2022-10-26 VITALS — BP 108/75 | HR 61 | Temp 97.5°F | Resp 20 | Wt 121.0 lb

## 2022-10-26 DIAGNOSIS — Z79811 Long term (current) use of aromatase inhibitors: Secondary | ICD-10-CM | POA: Diagnosis not present

## 2022-10-26 DIAGNOSIS — Z923 Personal history of irradiation: Secondary | ICD-10-CM | POA: Insufficient documentation

## 2022-10-26 DIAGNOSIS — C50411 Malignant neoplasm of upper-outer quadrant of right female breast: Secondary | ICD-10-CM | POA: Diagnosis present

## 2022-10-26 DIAGNOSIS — Z170 Estrogen receptor positive status [ER+]: Secondary | ICD-10-CM | POA: Diagnosis not present

## 2022-10-26 NOTE — Progress Notes (Signed)
Radiation Oncology Follow up Note  Name: Carrie Serrano   Date:   10/26/2022 MRN:  [id number] DOB: 10/14/1950    This 72 y.o. female presents to the clinic today for 3-year follow-up status post whole breast radiation to her right breast for stage I ER/PR positive invasive mammary carcinoma.  REFERRING PROVIDER: Milus Height, PA  HPI: Patient is a 72 year old female now out 3 years having completed whole breast radiation to her right breast for stage I ER/PR positive invasive mammary carcinoma.  Seen today in routine follow-up she is doing well.  She specifically denies breast tenderness cough or bone pain..  She had mammograms back in March which I have reviewed were BI-RADS 1 negative.  She is currently on Arimidex tolerating that well without side effect.  COMPLICATIONS OF TREATMENT: none  FOLLOW UP COMPLIANCE: keeps appointments   PHYSICAL EXAM:  BP 108/75   Pulse 61   Temp (!) 97.5 F (36.4 C) (Tympanic)   Resp 20   Wt 121 lb (54.9 kg)   BMI 19.53 kg/m  Lungs are clear to A&P cardiac examination essentially unremarkable with regular rate and rhythm. No dominant mass or nodularity is noted in either breast in 2 positions examined. Incision is well-healed. No axillary or supraclavicular adenopathy is appreciated. Cosmetic result is excellent.  Well-developed well-nourished patient in NAD. HEENT reveals PERLA, EOMI, discs not visualized.  Oral cavity is clear. No oral mucosal lesions are identified. Neck is clear without evidence of cervical or supraclavicular adenopathy. Lungs are clear to A&P. Cardiac examination is essentially unremarkable with regular rate and rhythm without murmur rub or thrill. Abdomen is benign with no organomegaly or masses noted. Motor sensory and DTR levels are equal and symmetric in the upper and lower extremities. Cranial nerves II through XII are grossly intact. Proprioception is intact. No peripheral adenopathy or edema is identified. No motor or sensory  levels are noted. Crude visual fields are within normal range.  RADIOLOGY RESULTS: Mammograms reviewed compatible with above-stated findings  PLAN: At the present time patient is doing well with no evidence of disease now out over 3 years.  I am going to discontinue follow-up care.  She continues follow-up care with medical oncology.  I be happy to reevaluate the patient anytime should that be indicated.  I would like to take this opportunity to thank you for allowing me to participate in the care of your patient.Carmina Miller, MD

## 2022-12-26 ENCOUNTER — Telehealth: Payer: Self-pay | Admitting: Hematology and Oncology

## 2022-12-26 NOTE — Telephone Encounter (Signed)
Rescheduled appointment per provider PAL. Patient is aware of the changes made to her upcoming appointment. 

## 2023-01-02 ENCOUNTER — Telehealth: Payer: Medicare Other | Admitting: Hematology and Oncology

## 2023-01-03 MED FILL — Anastrozole Tab 1 MG: ORAL | 90 days supply | Qty: 90 | Fill #2 | Status: AC

## 2023-01-23 ENCOUNTER — Inpatient Hospital Stay: Payer: Medicare Other | Attending: Hematology and Oncology | Admitting: Hematology and Oncology

## 2023-01-23 DIAGNOSIS — Z170 Estrogen receptor positive status [ER+]: Secondary | ICD-10-CM

## 2023-01-23 DIAGNOSIS — C50411 Malignant neoplasm of upper-outer quadrant of right female breast: Secondary | ICD-10-CM

## 2023-01-23 NOTE — Progress Notes (Signed)
HEMATOLOGY-ONCOLOGY TELEPHONE VISIT PROGRESS NOTE  I connected with our patient on 01/23/23 at  1:45 PM EDT by telephone and verified that I am speaking with the correct person using two identifiers.  I discussed the limitations, risks, security and privacy concerns of performing an evaluation and management service by telephone and the availability of in person appointments.  I also discussed with the patient that there may be a patient responsible charge related to this service. The patient expressed understanding and agreed to proceed.   History of Present Illness:  Discussed the use of AI scribe software for clinical note transcription with the patient, who gave verbal consent to proceed.  History of Present Illness   The patient, with a history of breast cancer and osteoporosis, presents for a routine follow-up. She reports feeling 'very good' and 'doing things I enjoy.' She maintains an active lifestyle, working out daily, and has a healthy diet. She has been on anastrozole for over three years without any issues and takes it daily. She also receives Prolia for her osteoporosis. Her recent breast MRI and mammograms have shown good results.        Oncology History  Malignant neoplasm of upper-outer quadrant of right breast in female, estrogen receptor positive (HCC)  06/13/2019 Initial Diagnosis   Screening mammogram detected a right breast mass, palpable on exam. Diagnostic mammogram showed a 1.0cm mass in the right breast at the 10:30 position. Biopsy showed invasive mammary carcinoma, grade 1, HER-2 equivocal by IHC, negative by FISH, ER+ 100%, PR+ 90%, Ki67 15%   06/18/2019 Cancer Staging   Staging form: Breast, AJCC 8th Edition - Clinical stage from 06/18/2019: cT1b, cN0, cM0, G2, ER+, PR+, HER2: Unknown   07/01/2019 Surgery   Right lumpectomy [facility]) ([id number]): IDC, 1.3cm, grade 2, clear margins, 2 right axillary lymph nodes negative.   07/01/2019 Cancer Staging   Staging form:  Breast, AJCC 8th Edition - Pathologic stage from 07/01/2019: Stage IA (pT1c, pN0, cM0, G2, ER+, PR+, HER2-)   07/16/2019 Oncotype testing   The Oncotype DX score was 6 predicting a risk of outside the breast recurrence over the next 9 years of 3% if the patient's only systemic therapy is Anastrozole for 5 years.    08/13/2019 - 09/15/2019 Radiation Therapy   The patient initially received a dose of 42.56 Gy in 16 fractions to the breast using whole-breast tangent fields. This was delivered using a 3-D conformal technique. The pt received a boost delivering an additional 16 Gy in 8 fractions using a electron boost with electrons. The total dose was 58.56 Gy.   09/2019 - 09/2024 Anti-estrogen oral therapy   Anastrozole     REVIEW OF SYSTEMS:   Constitutional: Denies fevers, chills or abnormal weight loss All other systems were reviewed with the patient and are negative. Observations/Objective:     Assessment Plan:  Malignant neoplasm of upper-outer quadrant of right breast in female, estrogen receptor positive (HCC) 07/01/2019: Right lumpectomy [facility]): IDC, 1.3cm, grade 2, clear margins, 2 right axillary lymph nodes negative.  ER 100%, PR 90%, Ki-67 15%, HER-2 negative by FISH T1c N0 stage Ia Oncotype DX score 6: Distant recurrence at 9 years: 3% Adjuvant radiation 08/12/2019-09/15/2019   Treatment plan: Adjuvant antiestrogen therapy with anastrozole 1 mg daily x5 to 7 years started June 2021   Anastrozole Toxicities: Tolerating it extremely well without any hot flashes or arthralgias or myalgias. She receives Prolia from her [place] physicians. She exercises regularly and goes to the gym.  Breast Cancer Surveillance: Mammograms: 06/27/2022: Benign Breast Density Cat D Breast MRI 07/24/2022: Benign breast density category D Because of such high breast density I recommended that we obtain a breast MRI every 2   years.   Anastrozole Therapy Tolerating well, no reported side  effects. -Continue Anastrozole as prescribed.  Osteoporosis Receiving Prolia from primary care provider. -Continue current treatment plan.  Follow-up Patient is doing well with no new concerns. -Schedule phone follow-up in 1 year.          I discussed the assessment and treatment plan with the patient. The patient was provided an opportunity to ask questions and all were answered. The patient agreed with the plan and demonstrated an understanding of the instructions. The patient was advised to call back or seek an in-person evaluation if the symptoms worsen or if the condition fails to improve as anticipated.   I provided 12 minutes of non-face-to-face time during this encounter.  This includes time for charting and coordination of care   Tamsen Meek, MD

## 2023-01-23 NOTE — Assessment & Plan Note (Addendum)
07/01/2019: Right lumpectomy [facility]): IDC, 1.3cm, grade 2, clear margins, 2 right axillary lymph nodes negative.  ER 100%, PR 90%, Ki-67 15%, HER-2 negative by FISH T1c N0 stage Ia Oncotype DX score 6: Distant recurrence at 9 years: 3% Adjuvant radiation 08/12/2019-09/15/2019   Treatment plan: Adjuvant antiestrogen therapy with anastrozole 1 mg daily x5 to 7 years started June 2021   Anastrozole Toxicities: Tolerating it extremely well without any hot flashes or arthralgias or myalgias. She receives Prolia from her [place] physicians. She exercises regularly and goes to the gym.   Breast Cancer Surveillance: Mammograms: 06/27/2022: Benign Breast Density Cat D Breast MRI 07/24/2022: Benign breast density category D Because of such high breast density I recommended that we obtain a breast MRI every 2 to 3 years.   RTC in 1 year

## 2023-02-02 ENCOUNTER — Other Ambulatory Visit: Payer: Self-pay

## 2023-02-02 MED ORDER — VALACYCLOVIR HCL 500 MG PO TABS
500.0000 mg | ORAL_TABLET | Freq: Two times a day (BID) | ORAL | 2 refills | Status: DC
  Filled 2023-02-02: qty 14, 7d supply, fill #0
  Filled 2023-02-02: qty 16, 8d supply, fill #0
  Filled 2023-10-03: qty 30, 15d supply, fill #1

## 2023-02-09 ENCOUNTER — Other Ambulatory Visit: Payer: Self-pay

## 2023-02-09 MED ORDER — INFLUENZA VAC A&B SURF ANT ADJ 0.5 ML IM SUSY
0.5000 mL | PREFILLED_SYRINGE | Freq: Once | INTRAMUSCULAR | 0 refills | Status: AC
  Filled 2023-02-09: qty 0.5, 1d supply, fill #0

## 2023-04-09 MED FILL — Anastrozole Tab 1 MG: ORAL | 90 days supply | Qty: 90 | Fill #3 | Status: AC

## 2023-05-14 ENCOUNTER — Other Ambulatory Visit: Payer: Self-pay | Admitting: Physician Assistant

## 2023-05-14 DIAGNOSIS — Z1231 Encounter for screening mammogram for malignant neoplasm of breast: Secondary | ICD-10-CM

## 2023-06-13 ENCOUNTER — Other Ambulatory Visit: Payer: Self-pay

## 2023-06-13 MED ORDER — HYDROQUINONE 4 % EX CREA
1.0000 | TOPICAL_CREAM | Freq: Two times a day (BID) | CUTANEOUS | 1 refills | Status: AC
  Filled 2023-06-13: qty 28.35, 30d supply, fill #0

## 2023-06-25 ENCOUNTER — Ambulatory Visit
Admission: RE | Admit: 2023-06-25 | Discharge: 2023-06-25 | Disposition: A | Discharge status: Home / Self Care | Payer: Medicare Other | Source: Ambulatory Visit | Attending: Physician Assistant | Admitting: Physician Assistant

## 2023-06-25 DIAGNOSIS — Z1231 Encounter for screening mammogram for malignant neoplasm of breast: Secondary | ICD-10-CM | POA: Diagnosis present

## 2023-07-03 ENCOUNTER — Inpatient Hospital Stay: Payer: Medicare Other | Attending: Hematology and Oncology | Admitting: Hematology and Oncology

## 2023-07-03 ENCOUNTER — Other Ambulatory Visit: Payer: Self-pay

## 2023-07-03 VITALS — BP 107/68 | HR 66 | Temp 97.7°F | Resp 18 | Ht 66.0 in | Wt 126.2 lb

## 2023-07-03 DIAGNOSIS — Z923 Personal history of irradiation: Secondary | ICD-10-CM | POA: Diagnosis not present

## 2023-07-03 DIAGNOSIS — C50411 Malignant neoplasm of upper-outer quadrant of right female breast: Secondary | ICD-10-CM | POA: Diagnosis not present

## 2023-07-03 DIAGNOSIS — Z1732 Human epidermal growth factor receptor 2 negative status: Secondary | ICD-10-CM | POA: Diagnosis not present

## 2023-07-03 DIAGNOSIS — Z1721 Progesterone receptor positive status: Secondary | ICD-10-CM | POA: Insufficient documentation

## 2023-07-03 DIAGNOSIS — Z170 Estrogen receptor positive status [ER+]: Secondary | ICD-10-CM | POA: Diagnosis not present

## 2023-07-03 DIAGNOSIS — Z79811 Long term (current) use of aromatase inhibitors: Secondary | ICD-10-CM | POA: Diagnosis not present

## 2023-07-03 MED ORDER — ANASTROZOLE 1 MG PO TABS
1.0000 mg | ORAL_TABLET | Freq: Every day | ORAL | 3 refills | Status: AC
  Filled 2023-07-03 – 2023-07-17 (×2): qty 90, 90d supply, fill #0
  Filled 2023-10-10: qty 90, 90d supply, fill #1
  Filled 2024-01-09: qty 90, 90d supply, fill #2
  Filled 2024-03-24: qty 90, 90d supply, fill #3

## 2023-07-03 NOTE — Progress Notes (Signed)
 Patient Care Team: Milus Height, [place] as PCP - General (Nurse Practitioner) Jake Bathe, MD as PCP - Cardiology (Cardiology) Almond Lint, MD as Consulting Physician (General Surgery) Serena Croissant, MD as Consulting Physician (Hematology and Oncology) Antony Blackbird, MD as Consulting Physician (Radiation Oncology) Carmina Miller, MD as Radiation Oncologist (Radiation Oncology)  DIAGNOSIS:  Encounter Diagnosis  Name Primary?   Malignant neoplasm of upper-outer quadrant of right breast in female, estrogen receptor positive (HCC) Yes    SUMMARY OF ONCOLOGIC HISTORY: Oncology History  Malignant neoplasm of upper-outer quadrant of right breast in female, estrogen receptor positive (HCC)  06/13/2019 Initial Diagnosis   Screening mammogram detected a right breast mass, palpable on exam. Diagnostic mammogram showed a 1.0cm mass in the right breast at the 10:30 position. Biopsy showed invasive mammary carcinoma, grade 1, HER-2 equivocal by IHC, negative by FISH, ER+ 100%, PR+ 90%, Ki67 15%   06/18/2019 Cancer Staging   Staging form: Breast, AJCC 8th Edition - Clinical stage from 06/18/2019: cT1b, cN0, cM0, G2, ER+, PR+, HER2: Unknown   07/01/2019 Surgery   Right lumpectomy [facility] Beers) ([id number]): IDC, 1.3cm, grade 2, clear margins, 2 right axillary lymph nodes negative.   07/01/2019 Cancer Staging   Staging form: Breast, AJCC 8th Edition - Pathologic stage from 07/01/2019: Stage IA (pT1c, pN0, cM0, G2, ER+, PR+, HER2-)   07/16/2019 Oncotype testing   The Oncotype DX score was 6 predicting a risk of outside the breast recurrence over the next 9 years of 3% if the patient's only systemic therapy is Anastrozole for 5 years.    08/13/2019 - 09/15/2019 Radiation Therapy   The patient initially received a dose of 42.56 Gy in 16 fractions to the breast using whole-breast tangent fields. This was delivered using a 3-D conformal technique. The pt received a boost delivering an additional 16 Gy in 8  fractions using a electron boost with electrons. The total dose was 58.56 Gy.   09/2019 - 09/2024 Anti-estrogen oral therapy   Anastrozole     CHIEF COMPLIANT: F/U on Anastrozole  HISTORY OF PRESENT ILLNESS:  History of Present Illness Carrie Serrano, a patient with a history of breast cancer diagnosed four years ago, presents for a routine follow-up. She has been on anastrozole with no reported side effects. She has been adhering to regular mammograms and occasional MRIs due to dense breasts. The most recent mammogram report was satisfactory. She remains active and exercises regularly.     ALLERGIES:  is allergic to latex.  MEDICATIONS:  Current Outpatient Medications  Medication Sig Dispense Refill   anastrozole (ARIMIDEX) 1 MG tablet TAKE 1 TABLET (1 MG TOTAL) BY MOUTH DAILY. 90 tablet 3   calcium carbonate (CALCIUM 600) 1500 (600 Ca) MG TABS tablet one tab orally daily     Calcium Carbonate-Vit D-Min (CALCIUM 1200) 1200-1000 MG-UNIT CHEW Chew 1 tablet by mouth daily.     cholecalciferol (VITAMIN D3) 25 MCG (1000 UNIT) tablet 1 tablet Orally Once a day     denosumab (PROLIA) 60 MG/ML SOLN injection Inject 60 mg into the skin every 6 (six) months. Administer in upper arm, thigh, or abdomen     hydroquinone 4 % cream Apply 1 Application topically to affected areas 1 (one) to 2 (two) times daily until clear. No more than 3 months at a time. Sunscreen every morning. 28.35 g 1   triamcinolone (NASACORT) 55 MCG/ACT AERO nasal inhaler Use 2 sprays in each nostril twice a day 16.9 mL 11   triamcinolone cream (KENALOG)  0.1 % Apply affected area bid X 7-10 days prn 80 g 0   valACYclovir (VALTREX) 500 MG tablet Take 500 mg by mouth 2 (two) times daily as needed. For outbreak     Vitamin D, Cholecalciferol, 400 units CAPS Take 800 Units by mouth.     Zoster Vaccine Adjuvanted [facility]) injection Inject 0.5 mLs into the muscle. 1 each 1   Biotin 5000 MCG TABS 5,000 mcg 1 day or 1 dose.      valACYclovir (VALTREX) 500 MG tablet Take 1 (one) tablet by mouth 2 (two) times daily for outbreaks. 30 tablet 2   No current facility-administered medications for this visit.    PHYSICAL EXAMINATION: ECOG PERFORMANCE STATUS: 1 - Symptomatic but completely ambulatory  Vitals:   07/03/23 1213  BP: 107/68  Pulse: 66  Resp: 18  Temp: 97.7 F (36.5 C)  SpO2: 100%   Filed Weights   07/03/23 1213  Weight: 126 lb 3.2 oz (57.2 kg)      LABORATORY DATA:  I have reviewed the data as listed    Latest Ref Rng & Units 06/18/2019    8:41 AM 06/04/2012    3:59 PM  CMP  Glucose 70 - 99 mg/dL 96  71   BUN 8 - 23 mg/dL 15  17   Creatinine 1.61 - 1.00 mg/dL 0.96  0.9   Sodium 045 - 145 mmol/L 141  140   Potassium 3.5 - 5.1 mmol/L 4.8  4.6   Chloride 98 - 111 mmol/L 107  105   CO2 22 - 32 mmol/L 26  28   Calcium 8.9 - 10.3 mg/dL 9.5  9.5   Total Protein 6.5 - 8.1 g/dL 6.8    Total Bilirubin 0.3 - 1.2 mg/dL 0.6    Alkaline Phos 38 - 126 U/L 75    AST 15 - 41 U/L 28    ALT 0 - 44 U/L 26      Lab Results  Component Value Date   WBC 5.2 09/10/2019   HGB 14.5 09/10/2019   HCT 42.8 09/10/2019   MCV 97.7 09/10/2019   PLT 206 09/10/2019   NEUTROABS 3.2 06/18/2019    ASSESSMENT & PLAN:  Malignant neoplasm of upper-outer quadrant of right breast in female, estrogen receptor positive (HCC) 07/01/2019: Right lumpectomy ([place]): IDC, 1.3cm, grade 2, clear margins, 2 right axillary lymph nodes negative.  ER 100%, PR 90%, Ki-67 15%, HER-2 negative by FISH T1c N0 stage Ia Oncotype DX score 6: Distant recurrence at 9 years: 3% Adjuvant radiation 08/12/2019-09/15/2019   Treatment plan: Adjuvant antiestrogen therapy with anastrozole 1 mg daily x5 to 7 years started June 2021   Anastrozole Toxicities: Tolerating it extremely well without any hot flashes or arthralgias or myalgias. She receives Prolia from her [place] physicians. She exercises regularly and goes to the gym.   Breast Cancer  Surveillance: Mammograms: 06/27/2023: Benign Breast Density Cat D Breast MRI 07/03/2023: Benign breast density category D Because of such high breast density I recommended that we obtain a breast MRI every 2 years.   Anastrozole Therapy Tolerating well, no reported side effects. -Continue Anastrozole as prescribed.   Osteoporosis Receiving Prolia from primary care provider. -Continue current treatment plan.   Follow-up Patient is doing well with no new concerns. -Schedule phone follow-up in 1 year.  ------------------------------------- Assessment and Plan Assessment & Plan Breast Cancer Four years post-diagnosis, currently on Anastrozole with no reported side effects. Mammogram results satisfactory. Dense breasts necessitate occasional MRI screening. -Continue Anastrozole,  ensure sufficient refills for the year. -Next mammogram scheduled for February 2026. -Plan for MRI in August 2026, six months post-mammogram.  General Health Maintenance / Followup Plans -Annual follow-up appointment scheduled for next year. -Order MRI in 2026 during the follow-up visit.      No orders of the defined types were placed in this encounter.  The patient has a good understanding of the overall plan. she agrees with it. she will call with any problems that may develop before the next visit here. Total time spent: 30 mins including face to face time and time spent for planning, charting and co-ordination of care   Tamsen Meek, MD 07/03/23

## 2023-07-03 NOTE — Assessment & Plan Note (Signed)
 07/01/2019: Right lumpectomy [facility]): IDC, 1.3cm, grade 2, clear margins, 2 right axillary lymph nodes negative.  ER 100%, PR 90%, Ki-67 15%, HER-2 negative by FISH T1c N0 stage Ia Oncotype DX score 6: Distant recurrence at 9 years: 3% Adjuvant radiation 08/12/2019-09/15/2019   Treatment plan: Adjuvant antiestrogen therapy with anastrozole 1 mg daily x5 to 7 years started June 2021   Anastrozole Toxicities: Tolerating it extremely well without any hot flashes or arthralgias or myalgias. She receives Prolia from her [place] physicians. She exercises regularly and goes to the gym.   Breast Cancer Surveillance: Mammograms: 06/27/2023: Benign Breast Density Cat D Breast MRI 07/03/2023: Benign breast density category D Because of such high breast density I recommended that we obtain a breast MRI every 2 years.   Anastrozole Therapy Tolerating well, no reported side effects. -Continue Anastrozole as prescribed.   Osteoporosis Receiving Prolia from primary care provider. -Continue current treatment plan.   Follow-up Patient is doing well with no new concerns. -Schedule phone follow-up in 1 year.

## 2023-07-16 ENCOUNTER — Other Ambulatory Visit: Payer: Self-pay

## 2023-07-17 ENCOUNTER — Other Ambulatory Visit: Payer: Self-pay

## 2023-10-03 ENCOUNTER — Other Ambulatory Visit: Payer: Self-pay

## 2024-01-10 ENCOUNTER — Other Ambulatory Visit: Payer: Self-pay

## 2024-02-18 ENCOUNTER — Other Ambulatory Visit: Payer: Self-pay

## 2024-02-18 MED ORDER — FLUZONE HIGH-DOSE 0.5 ML IM SUSY
0.5000 mL | PREFILLED_SYRINGE | Freq: Once | INTRAMUSCULAR | 0 refills | Status: AC
  Filled 2024-02-18: qty 0.5, 1d supply, fill #0

## 2024-03-21 ENCOUNTER — Other Ambulatory Visit: Payer: Self-pay

## 2024-03-21 MED ORDER — VALACYCLOVIR HCL 500 MG PO TABS
500.0000 mg | ORAL_TABLET | Freq: Three times a day (TID) | ORAL | 2 refills | Status: AC
  Filled 2024-03-21: qty 30, 10d supply, fill #0

## 2024-04-30 ENCOUNTER — Other Ambulatory Visit: Payer: Self-pay

## 2024-04-30 MED ORDER — SULFAMETHOXAZOLE-TRIMETHOPRIM 800-160 MG PO TABS
1.0000 | ORAL_TABLET | ORAL | 0 refills | Status: AC
  Filled 2024-04-30: qty 14, 32d supply, fill #0

## 2024-04-30 MED ORDER — TRIAMCINOLONE ACETONIDE 0.1 % EX CREA
1.0000 | TOPICAL_CREAM | Freq: Three times a day (TID) | CUTANEOUS | 1 refills | Status: AC
  Filled 2024-04-30: qty 15, 30d supply, fill #0

## 2024-05-27 ENCOUNTER — Other Ambulatory Visit: Payer: Self-pay | Admitting: Hematology and Oncology

## 2024-05-27 DIAGNOSIS — Z1231 Encounter for screening mammogram for malignant neoplasm of breast: Secondary | ICD-10-CM

## 2024-06-04 ENCOUNTER — Other Ambulatory Visit: Payer: Self-pay

## 2024-06-04 ENCOUNTER — Ambulatory Visit (INDEPENDENT_AMBULATORY_CARE_PROVIDER_SITE_OTHER): Payer: Self-pay

## 2024-06-04 ENCOUNTER — Ambulatory Visit (INDEPENDENT_AMBULATORY_CARE_PROVIDER_SITE_OTHER)

## 2024-06-04 DIAGNOSIS — M25571 Pain in right ankle and joints of right foot: Secondary | ICD-10-CM

## 2024-06-04 DIAGNOSIS — M778 Other enthesopathies, not elsewhere classified: Secondary | ICD-10-CM | POA: Diagnosis not present

## 2024-06-04 DIAGNOSIS — L539 Erythematous condition, unspecified: Secondary | ICD-10-CM | POA: Diagnosis not present

## 2024-06-04 DIAGNOSIS — B353 Tinea pedis: Secondary | ICD-10-CM

## 2024-06-04 DIAGNOSIS — Q6671 Congenital pes cavus, right foot: Secondary | ICD-10-CM

## 2024-06-04 MED ORDER — AMOXICILLIN-POT CLAVULANATE 875-125 MG PO TABS
1.0000 | ORAL_TABLET | Freq: Two times a day (BID) | ORAL | 0 refills | Status: AC
  Filled 2024-06-04: qty 14, 7d supply, fill #0

## 2024-06-04 NOTE — Progress Notes (Signed)
 "  Subjective:  Patient ID: Carrie Serrano, female    DOB: [birth year],  MRN: [id number]  Chief Complaint  Patient presents with   Foot Pain    Hard spot on the bottom of her right foot x 2.5 weeks. The 4th toe on the right foot is red and itchy     Discussed the use of AI scribe software for clinical note transcription with the patient, who gave verbal consent to proceed.  History of Present Illness Carrie Serrano is a 74 year old female who presents with a callus-like lesion on the right plantar foot and acute onset erythema with pruritus of the right fourth toe.  She developed a callus-like lesion on the plantar aspect of the right foot and shaved it herself, causing superficial skin removal and a minor abrasion. She stopped self-care, began using donut bandages to offload the area, and has intermittently used insoles, which helped. She has no current pain but is concerned the lesion persists.  Yesterday morning she developed sudden erythema and intense pruritus of the right fourth toe without trauma, insect bite, or open wound. An over-the-counter anti-pruritic gave partial relief. She denies pain, swelling, or palpable mass in the toe and denies ankle sprains, instability, or pain from pes cavus.     Review of Systems: Negative except as noted in the HPI. Denies N/V/F/Ch.  Past Medical History:  Diagnosis Date   Breast cancer (HCC) 2021   Right breast   HSV-2 (herpes simplex virus 2) infection    Osteopenia    PAT (paroxysmal atrial tachycardia)    Personal history of radiation therapy    SVT (supraventricular tachycardia)    Current Medications[1]  Tobacco Use History[2]  Allergies[3] Objective:   Constitutional Well developed. Well nourished. Oriented to person, place, and time.  Vascular Dorsalis pedis pulses palpable bilaterally. Posterior tibial pulses palpable bilaterally. Capillary refill normal to all digits.  No cyanosis or clubbing noted. Pedal  hair growth normal.  Neurologic Normal speech. Epicritic sensation to light touch grossly intact bilaterally. Negative tinel sign at tarsal tunnel bilaterally.   Dermatologic Skin texture and turgor are within normal limits.  Small abrasion with recently epithelialized skin on the plantar aspect of the arch just distal to the medial calcaneal tubercle.  No underlying hyperkeratotic lesion.  No sign of infection or remaining open wound. Mild skin peeling on plantar foot.   Musculoskeletal: 5 out of 5 muscle strength all major pedal muscle groups.  Significant pes cavus foot structure that is not contributing to her current pathology.  Right fourth toe is erythematous extending to the PIPJ.  No open wounds or lesion.  No pain to palpation of the nailbed, digit, movement of DIPJ or PIPJ.   Radiographs: Taken and reviewed.  3 views right foot.  These demonstrate a pes cavus foot structure with increased calcaneal inclination, decreased talar declination and increased first metatarsal declination.  The fourth digit is normal in appearance without any fracture, cortical erosion or other pathology identified.  No acute osseous finding such as fracture or dislocation.   Assessment:   1. Capsulitis of foot, right   2. Pes cavus of right foot   3. Tinea pedis of right foot   4. Toe joint pain, right   5. Toe erythema      Plan:  Patient was evaluated and treated and all questions answered.  Assessment and Plan Assessment & Plan Right plantar foot lesion (callus/abrasion) Chronic hyperkeratotic lesion with superficial abrasion from self-debridement.  No active callus, ulceration, or infection. Symptoms improved with padding and insoles. No current pain or tenderness. - Debrided superficial necrotic skin in clinic. - Recommended 40% urea lotion application. - Advised continued use of protective pads. - Discussed custom orthotics with integrated pads if symptoms persist or recur. - Instructed  follow-up in 3-4 weeks or sooner if symptoms worsen.  Red, itchy right fourth toe (possible cellulitis or tinea pedis) Acute erythema and pruritus of the right fourth toe. Differential includes mild cellulitis and tinea pedis. No fracture, open lesion, or aggressive infection.  - Due to acute nature and erythematous appearance, short course of oral abx was prescribed out of precaution. Augmentin  875/125 q12h x 7 days.  - Advised use of over-the-counter topical antifungal (Lotrimin) for possible tinea pedis. - Instructed follow-up in 3-4 weeks or sooner if symptoms worsen.       No follow-ups on file.   Prentice Ovens, DPM AACFAS Fellowship Trained Podiatric Surgeon [facility]     [1]  Current Outpatient Medications:    amoxicillin -clavulanate (AUGMENTIN ) 875-125 MG tablet, Take 1 tablet by mouth 2 (two) times daily., Disp: 14 tablet, Rfl: 0   anastrozole  (ARIMIDEX ) 1 MG tablet, Take 1 tablet (1 mg total) by mouth daily., Disp: 90 tablet, Rfl: 3   Biotin 5000 MCG TABS, 5,000 mcg 1 day or 1 dose., Disp: , Rfl:    calcium carbonate (CALCIUM 600) 1500 (600 Ca) MG TABS tablet, one tab orally daily, Disp: , Rfl:    Calcium Carbonate-Vit D-Min (CALCIUM 1200) 1200-1000 MG-UNIT CHEW, Chew 1 tablet by mouth daily., Disp: , Rfl:    cholecalciferol (VITAMIN D3) 25 MCG (1000 UNIT) tablet, 1 tablet Orally Once a day, Disp: , Rfl:    denosumab  (PROLIA ) 60 MG/ML SOLN injection, Inject 60 mg into the skin every 6 (six) months. Administer in upper arm, thigh, or abdomen, Disp: , Rfl:    hydroquinone  4 % cream, Apply 1 Application topically to affected areas 1 (one) to 2 (two) times daily until clear. No more than 3 months at a time. Sunscreen every morning., Disp: 28.35 g, Rfl: 1   sulfamethoxazole -trimethoprim  (BACTRIM  DS) 800-160 MG tablet, Take 1 tablet by mouth 3 times a week., Disp: 14 tablet, Rfl: 0   triamcinolone  (NASACORT ) 55 MCG/ACT AERO nasal inhaler, Use 2 sprays in each  nostril twice a day, Disp: 16.9 mL, Rfl: 11   triamcinolone  cream (KENALOG ) 0.1 %, Apply affected area bid X 7-10 days prn, Disp: 80 g, Rfl: 0   triamcinolone  cream (KENALOG ) 0.1 %, Apply 1 Application topically 2-3 (two-three) times daily., Disp: 30 g, Rfl: 1   valACYclovir  (VALTREX ) 500 MG tablet, Take 500 mg by mouth 2 (two) times daily as needed. For outbreak, Disp: , Rfl:    valACYclovir  (VALTREX ) 500 MG tablet, Take 1 (one) tablet by mouth 2 (two) times daily for outbreaks., Disp: 30 tablet, Rfl: 2   Vitamin D, Cholecalciferol, 400 units CAPS, Take 800 Units by mouth., Disp: , Rfl:    Zoster Vaccine Adjuvanted (SHINGRIX ) injection, Inject 0.5 mLs into the muscle., Disp: 1 each, Rfl: 1 [2]  Social History Tobacco Use  Smoking Status Former   Current packs/day: 0.00   Average packs/day: 0.5 packs/day for 25.0 years (12.5 ttl pk-yrs)   Types: Cigarettes   Start date: 05/01/1989   Quit date: 05/01/2014   Years since quitting: 10.1  Smokeless Tobacco Never  Tobacco Comments   5 cigarettes a day.  [3]  Allergies Allergen Reactions  Latex Itching   "

## 2024-06-25 ENCOUNTER — Encounter

## 2024-07-02 ENCOUNTER — Ambulatory Visit: Admitting: Hematology and Oncology

## 2024-07-02 ENCOUNTER — Ambulatory Visit
